# Patient Record
Sex: Female | Born: 1960
Health system: Southern US, Community
[De-identification: ages and names within clinical notes are randomized; demographics above are authoritative.]

## PROBLEM LIST (undated history)

## (undated) DIAGNOSIS — F32A Depression, unspecified: Secondary | ICD-10-CM

## (undated) DIAGNOSIS — F329 Major depressive disorder, single episode, unspecified: Secondary | ICD-10-CM

## (undated) DIAGNOSIS — E785 Hyperlipidemia, unspecified: Secondary | ICD-10-CM

## (undated) DIAGNOSIS — I10 Essential (primary) hypertension: Secondary | ICD-10-CM

## (undated) DIAGNOSIS — K219 Gastro-esophageal reflux disease without esophagitis: Secondary | ICD-10-CM

## (undated) HISTORY — PX: COLONOSCOPY: SHX174

## (undated) HISTORY — DX: Essential (primary) hypertension: I10

## (undated) HISTORY — DX: Major depressive disorder, single episode, unspecified: F32.9

## (undated) HISTORY — DX: Depression, unspecified: F32.A

## (undated) HISTORY — DX: Gastro-esophageal reflux disease without esophagitis: K21.9

## (undated) HISTORY — DX: Hyperlipidemia, unspecified: E78.5

## (undated) HISTORY — PX: TUBAL LIGATION: SHX77

---

## 1999-05-12 ENCOUNTER — Emergency Department (HOSPITAL_COMMUNITY): Admission: EM | Admit: 1999-05-12 | Discharge: 1999-05-12 | Payer: Self-pay | Admitting: Emergency Medicine

## 2001-03-17 ENCOUNTER — Other Ambulatory Visit: Admission: RE | Admit: 2001-03-17 | Discharge: 2001-03-17 | Payer: Self-pay | Admitting: Gynecology

## 2003-07-01 ENCOUNTER — Other Ambulatory Visit: Admission: RE | Admit: 2003-07-01 | Discharge: 2003-07-01 | Payer: Self-pay | Admitting: Gynecology

## 2006-09-17 LAB — HM PAP SMEAR: HM Pap smear: NORMAL

## 2006-09-17 LAB — HM COLONOSCOPY: HM Colonoscopy: NORMAL

## 2006-09-17 LAB — HM MAMMOGRAPHY: HM Mammogram: NORMAL

## 2007-02-24 ENCOUNTER — Other Ambulatory Visit: Admission: RE | Admit: 2007-02-24 | Discharge: 2007-02-24 | Payer: Self-pay | Admitting: Family Medicine

## 2007-02-27 ENCOUNTER — Encounter: Admission: RE | Admit: 2007-02-27 | Discharge: 2007-02-27 | Payer: Self-pay | Admitting: Family Medicine

## 2007-03-27 ENCOUNTER — Ambulatory Visit: Payer: Self-pay | Admitting: Gastroenterology

## 2007-04-10 ENCOUNTER — Ambulatory Visit: Payer: Self-pay | Admitting: Gastroenterology

## 2011-11-24 ENCOUNTER — Encounter (HOSPITAL_COMMUNITY): Payer: Self-pay | Admitting: Emergency Medicine

## 2011-11-24 ENCOUNTER — Other Ambulatory Visit: Payer: Self-pay

## 2011-11-24 ENCOUNTER — Emergency Department (HOSPITAL_COMMUNITY): Payer: 59

## 2011-11-24 ENCOUNTER — Emergency Department (HOSPITAL_COMMUNITY)
Admission: EM | Admit: 2011-11-24 | Discharge: 2011-11-24 | Disposition: A | Discharge status: Home / Self Care | Payer: 59 | Attending: Emergency Medicine | Admitting: Emergency Medicine

## 2011-11-24 DIAGNOSIS — R079 Chest pain, unspecified: Secondary | ICD-10-CM | POA: Insufficient documentation

## 2011-11-24 DIAGNOSIS — M940 Chondrocostal junction syndrome [Tietze]: Secondary | ICD-10-CM | POA: Insufficient documentation

## 2011-11-24 DIAGNOSIS — R7309 Other abnormal glucose: Secondary | ICD-10-CM | POA: Insufficient documentation

## 2011-11-24 DIAGNOSIS — R739 Hyperglycemia, unspecified: Secondary | ICD-10-CM

## 2011-11-24 LAB — DIFFERENTIAL
Basophils Absolute: 0 10*3/uL (ref 0.0–0.1)
Basophils Relative: 1 % (ref 0–1)
Monocytes Absolute: 0.3 10*3/uL (ref 0.1–1.0)
Neutro Abs: 2.1 10*3/uL (ref 1.7–7.7)
Neutrophils Relative %: 61 % (ref 43–77)

## 2011-11-24 LAB — D-DIMER, QUANTITATIVE: D-Dimer, Quant: 0.35 ug/mL-FEU (ref 0.00–0.48)

## 2011-11-24 LAB — CBC
HCT: 39 % (ref 36.0–46.0)
MCHC: 34.1 g/dL (ref 30.0–36.0)
RDW: 11.9 % (ref 11.5–15.5)

## 2011-11-24 LAB — COMPREHENSIVE METABOLIC PANEL
AST: 14 U/L (ref 0–37)
Albumin: 3.3 g/dL — ABNORMAL LOW (ref 3.5–5.2)
Chloride: 100 mEq/L (ref 96–112)
Creatinine, Ser: 0.64 mg/dL (ref 0.50–1.10)
Total Bilirubin: 0.5 mg/dL (ref 0.3–1.2)
Total Protein: 7 g/dL (ref 6.0–8.3)

## 2011-11-24 MED ORDER — IBUPROFEN 600 MG PO TABS: 600.0000 mg | ORAL_TABLET | Freq: Four times a day (QID) | ORAL | Status: AC | PRN

## 2011-11-24 MED ORDER — METFORMIN HCL 500 MG PO TABS: 500.0000 mg | ORAL_TABLET | Freq: Two times a day (BID) | ORAL | Status: DC

## 2011-11-24 MED ORDER — CYCLOBENZAPRINE HCL 10 MG PO TABS
10.0000 mg | ORAL_TABLET | Freq: Once | ORAL | Status: AC
  Administered 2011-11-24: 10 mg via ORAL
  Filled 2011-11-24: qty 1

## 2011-11-24 MED ORDER — KETOROLAC TROMETHAMINE 30 MG/ML IJ SOLN
30.0000 mg | Freq: Once | INTRAMUSCULAR | Status: AC
  Administered 2011-11-24: 30 mg via INTRAVENOUS
  Filled 2011-11-24: qty 1

## 2011-11-24 MED ORDER — CYCLOBENZAPRINE HCL 5 MG PO TABS: 5.0000 mg | ORAL_TABLET | Freq: Three times a day (TID) | ORAL | Status: AC | PRN

## 2011-11-24 NOTE — ED Notes (Signed)
Patient remains on monitor and 2L oxygen with sats of 100%. Patient states she is still having chest pain.

## 2011-11-24 NOTE — ED Notes (Signed)
Patient remains on monitor and 2L oxygen with sats of 100%. Patient states she is feeling better.

## 2011-11-24 NOTE — ED Notes (Signed)
Pt. Stated, i woke this am at 0530 with CP and some SOB.  My arms feel a little numb and tingling

## 2011-11-24 NOTE — ED Notes (Signed)
Patient states she woke at 0530 with chest pain and left arm was numb. Patient states she has chest pain when she takes a deep breath and right arm tingling. Patient denies N/V/F. Patient placed on monitor and 2L oxygen with satsof 99%.

## 2011-11-24 NOTE — ED Provider Notes (Signed)
History     CSN: [id number]  Arrival date & time 11/24/11  4098   First MD Initiated Contact with Patient 11/24/11 [id number]      Chief Complaint  Patient presents with  . Chest Pain    (Consider location/radiation/quality/duration/timing/severity/associated sxs/prior treatment) HPI  Patient relates she's been unemployed for 3 years. She states she recently started working at [employer]. 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 She does not do any heavy lifting or stocking. She states at 5 AM this morning she was awakened with a central chest pain that has been constant. She states the pain is a tightness in her chest and  is sore when she touches it. She states it hurts when she breathes. She states the pain does not radiate however she states her left arm is numb and  the fingers of both hands are tingling. She states no nausea or vomiting today however she did have that last week. She denies diarrhea. She does have some shortness of breath. She denies diaphoresis. She states she had chest pain similar to this about 6 months ago that lasted about 6 hours and she did not seek medical care. She denies any swelling or pain in her legs. She denies taking any kind of hormone replacement or recent travel where she is sitting for prolonged periods.  PCP none  Past Medical History  Diagnosis Date  . Diabetes mellitus     History reviewed. No pertinent past surgical history.  History reviewed. No pertinent family history. FOP had CABG when older MOP HTN diabetes  History  Substance Use Topics  . Smoking status: No  . Smokeless tobacco: Not on file  . Alcohol Use: occass  employed  OB History    Grav Para Term Preterm Abortions TAB SAB Ect Mult Living                  Review of Systems  All other systems reviewed and are negative.    Allergies  Review of patient's allergies indicates no known allergies.  Home Medications   Current Outpatient Rx  Name Route Sig Dispense Refill  .  IBUPROFEN 200 MG PO TABS Oral Take 800 mg by mouth every 8 (eight) hours as needed. For pain.    . ADULT MULTIVITAMIN W/MINERALS CH Oral Take 1 tablet by mouth daily.    . OMEGA 3 PO Oral Take 2 capsules by mouth daily.      BP 166/83  Pulse 72  Temp(Src) 98 F (36.7 C) (Oral)  Resp 20  SpO2 100%  LMP 10/02/2011  Vital signs normal except hypertension   Physical Exam  Nursing note and vitals reviewed. Constitutional: She is oriented to person, place, and time. She appears well-developed and well-nourished.  Non-toxic appearance. She does not appear ill. She appears distressed.       Tearful, clutching her chest  HENT:  Head: Normocephalic and atraumatic.  Right Ear: External ear normal.  Left Ear: External ear normal.  Nose: Nose normal. No mucosal edema or rhinorrhea.  Mouth/Throat: Oropharynx is clear and moist and mucous membranes are normal. No dental abscesses or uvula swelling.  Eyes: Conjunctivae and EOM are normal. Pupils are equal, round, and reactive to light.  Neck: Normal range of motion and full passive range of motion without pain. Neck supple.  Cardiovascular: Normal rate, regular rhythm and normal heart sounds.  Exam reveals no gallop and no friction rub.   No murmur heard. Pulmonary/Chest: Effort normal. No respiratory distress. She  has no wheezes. She has no rhonchi. She has no rales. She exhibits tenderness. She exhibits no crepitus.       Very tender to even light touch across her central chest. I am unable to assess her breath sounds because she will not breathe deeply. She states it's painful.  Abdominal: Soft. Normal appearance and bowel sounds are normal. She exhibits no distension. There is no tenderness. There is no rebound and no guarding.  Musculoskeletal: Normal range of motion. She exhibits no edema and no tenderness.       Moves all extremities well.   Neurological: She is alert and oriented to person, place, and time. She has normal strength. No  cranial nerve deficit.  Skin: Skin is warm, dry and intact. No rash noted. No erythema. No pallor.  Psychiatric: Her speech is normal and behavior is normal. Her mood appears not anxious.       Anxious    ED Course  Procedures (including critical care time)   Medications  ketorolac (TORADOL) 30 MG/ML injection 30 mg (30 mg Intravenous Given 11/24/11 1015)  cyclobenzaprine (FLEXERIL) tablet 10 mg (10 mg Oral Given 11/24/11 0950)   Pt relates her pain is better. Pt states she has been told she is borderline diabetic. She wants to look at the diabetic diet before she starts on diabetic medications.  Pt was upset, thinking she was having a heart attack or stroke, she has been reassured.   Results for orders placed during the hospital encounter of 11/24/11  CBC      Component Value Range   WBC 3.4 (*) 4.0 - 10.5 (K/uL)   RBC 4.18  3.87 - 5.11 (MIL/uL)   Hemoglobin 13.3  12.0 - 15.0 (g/dL)   HCT 16.1  09.6 - 04.5 (%)   MCV 93.3  78.0 - 100.0 (fL)   MCH 31.8  26.0 - 34.0 (pg)   MCHC 34.1  30.0 - 36.0 (g/dL)   RDW 40.9  81.1 - 91.4 (%)   Platelets 160  150 - 400 (K/uL)  DIFFERENTIAL      Component Value Range   Neutrophils Relative 61  43 - 77 (%)   Neutro Abs 2.1  1.7 - 7.7 (K/uL)   Lymphocytes Relative 28  12 - 46 (%)   Lymphs Abs 1.0  0.7 - 4.0 (K/uL)   Monocytes Relative 9  3 - 12 (%)   Monocytes Absolute 0.3  0.1 - 1.0 (K/uL)   Eosinophils Relative 1  0 - 5 (%)   Eosinophils Absolute 0.0  0.0 - 0.7 (K/uL)   Basophils Relative 1  0 - 1 (%)   Basophils Absolute 0.0  0.0 - 0.1 (K/uL)  COMPREHENSIVE METABOLIC PANEL      Component Value Range   Sodium 135  135 - 145 (mEq/L)   Potassium 3.9  3.5 - 5.1 (mEq/L)   Chloride 100  96 - 112 (mEq/L)   CO2 26  19 - 32 (mEq/L)   Glucose, Bld 283 (*) 70 - 99 (mg/dL)   BUN 12  6 - 23 (mg/dL)   Creatinine, Ser 7.82  0.50 - 1.10 (mg/dL)   Calcium 8.9  8.4 - 95.6 (mg/dL)   Total Protein 7.0  6.0 - 8.3 (g/dL)   Albumin 3.3 (*) 3.5 - 5.2 (g/dL)     AST 14  0 - 37 (U/L)   ALT 18  0 - 35 (U/L)   Alkaline Phosphatase 79  39 - 117 (U/L)   Total Bilirubin  0.5  0.3 - 1.2 (mg/dL)   GFR calc non Af Amer >90  >90 (mL/min)   GFR calc Af Amer >90  >90 (mL/min)  TROPONIN I      Component Value Range   Troponin I <0.30  <0.30 (ng/mL)  D-DIMER, QUANTITATIVE      Component Value Range   D-Dimer, Quant 0.35  0.00 - 0.48 (ug/mL-FEU)   No results found.   Date: 11/24/2011  Rate: 72  Rhythm: normal sinus rhythm  QRS Axis: normal  Intervals: normal  ST/T Wave abnormalities: normal  Conduction Disutrbances:none  Narrative Interpretation: Q waves septally  Old EKG Reviewed: unchanged from 10/162009    1. Costochondritis   2. Chest pain   3. Hyperglycemia    New Prescriptions   CYCLOBENZAPRINE (FLEXERIL) 5 MG TABLET    Take 1 tablet (5 mg total) by mouth 3 (three) times daily as needed for muscle spasms.   IBUPROFEN (ADVIL,MOTRIN) 600 MG TABLET    Take 1 tablet (600 mg total) by mouth every 6 (six) hours as needed for pain.   METFORMIN (GLUCOPHAGE) 500 MG TABLET    Take 1 tablet (500 mg total) by mouth 2 (two) times daily with a meal.   Plan discharge Devoria Albe, MD, FACEP    MDM          Ward Givens, MD 11/24/11 [phone]

## 2011-11-24 NOTE — Discharge Instructions (Signed)
Take the ibuprofen and flexeril for your chest wall pain (costochondritis). Try soaking in a warm shower to relax your chest wall muscles. Recheck if you get a cough, fever or struggle to breathe. Your blood glucose was 283 which is high (it should be 100 or less). Look at the diabetic diet and start trying to follow it. You can have your blood sugar checked again at the [facility] in 1-2 weeks and if you blood sugar is still high you should start the metformin. Contact your insurance company to find a primary care doctor.

## 2011-11-24 NOTE — ED Notes (Signed)
Patient remains on monitor and 2L oxygen sats 100%. Patient states she is having chest pain at this time.

## 2012-01-02 ENCOUNTER — Ambulatory Visit: Payer: 59 | Admitting: Family Medicine

## 2012-04-03 ENCOUNTER — Encounter: Payer: Self-pay | Admitting: Internal Medicine

## 2012-04-03 ENCOUNTER — Other Ambulatory Visit (INDEPENDENT_AMBULATORY_CARE_PROVIDER_SITE_OTHER): Payer: 59

## 2012-04-03 ENCOUNTER — Ambulatory Visit (INDEPENDENT_AMBULATORY_CARE_PROVIDER_SITE_OTHER): Payer: 59 | Admitting: Internal Medicine

## 2012-04-03 VITALS — BP 148/98 | HR 81 | Temp 97.8°F | Resp 16 | Wt 200.5 lb

## 2012-04-03 DIAGNOSIS — E118 Type 2 diabetes mellitus with unspecified complications: Secondary | ICD-10-CM | POA: Insufficient documentation

## 2012-04-03 DIAGNOSIS — IMO0001 Reserved for inherently not codable concepts without codable children: Secondary | ICD-10-CM

## 2012-04-03 DIAGNOSIS — F329 Major depressive disorder, single episode, unspecified: Secondary | ICD-10-CM

## 2012-04-03 DIAGNOSIS — Z0000 Encounter for general adult medical examination without abnormal findings: Secondary | ICD-10-CM | POA: Insufficient documentation

## 2012-04-03 DIAGNOSIS — Z23 Encounter for immunization: Secondary | ICD-10-CM

## 2012-04-03 DIAGNOSIS — I10 Essential (primary) hypertension: Secondary | ICD-10-CM

## 2012-04-03 DIAGNOSIS — F32A Depression, unspecified: Secondary | ICD-10-CM | POA: Insufficient documentation

## 2012-04-03 DIAGNOSIS — Z1231 Encounter for screening mammogram for malignant neoplasm of breast: Secondary | ICD-10-CM

## 2012-04-03 LAB — URINALYSIS, ROUTINE W REFLEX MICROSCOPIC
Hgb urine dipstick: NEGATIVE
Nitrite: NEGATIVE
Specific Gravity, Urine: 1.01 (ref 1.000–1.030)
Total Protein, Urine: NEGATIVE
Urine Glucose: 100
Urobilinogen, UA: 0.2 (ref 0.0–1.0)

## 2012-04-03 LAB — CBC WITH DIFFERENTIAL/PLATELET
Basophils Absolute: 0 10*3/uL (ref 0.0–0.1)
Eosinophils Relative: 2.2 % (ref 0.0–5.0)
HCT: 40.5 % (ref 36.0–46.0)
Lymphocytes Relative: 24.3 % (ref 12.0–46.0)
Monocytes Relative: 10.5 % (ref 3.0–12.0)
Neutrophils Relative %: 62.7 % (ref 43.0–77.0)
Platelets: 179 10*3/uL (ref 150.0–400.0)
WBC: 3.6 10*3/uL — ABNORMAL LOW (ref 4.5–10.5)

## 2012-04-03 LAB — TSH: TSH: 0.6 u[IU]/mL (ref 0.35–5.50)

## 2012-04-03 LAB — COMPREHENSIVE METABOLIC PANEL
ALT: 19 U/L (ref 0–35)
Albumin: 3.9 g/dL (ref 3.5–5.2)
CO2: 25 mEq/L (ref 19–32)
Chloride: 102 mEq/L (ref 96–112)
GFR: 130.76 mL/min (ref >60.00)
Glucose, Bld: 197 mg/dL — ABNORMAL HIGH (ref 70–99)
Potassium: 5.1 mEq/L (ref 3.5–5.1)
Sodium: 135 mEq/L (ref 135–145)
Total Protein: 7.7 g/dL (ref 6.0–8.3)

## 2012-04-03 LAB — MICROALBUMIN / CREATININE URINE RATIO: Microalb Creat Ratio: 0.2 mg/g (ref 0.0–30.0)

## 2012-04-03 LAB — HEMOGLOBIN A1C: Hgb A1c MFr Bld: 8.1 % — ABNORMAL HIGH (ref 4.6–6.5)

## 2012-04-03 LAB — LIPID PANEL: VLDL: 7.6 mg/dL (ref 0.0–40.0)

## 2012-04-03 MED ORDER — VILAZODONE HCL 10 & 20 & 40 MG PO KIT: 1.0000 | PACK | Freq: Every day | ORAL | Status: DC

## 2012-04-03 MED ORDER — SITAGLIP PHOS-METFORMIN HCL ER 100-1000 MG PO TB24: 1.0000 | ORAL_TABLET | Freq: Every day | ORAL | Status: DC

## 2012-04-03 MED ORDER — AMLODIPINE-OLMESARTAN 5-20 MG PO TABS: 1.0000 | ORAL_TABLET | Freq: Every day | ORAL | Status: DC

## 2012-04-03 NOTE — Assessment & Plan Note (Signed)
I will check her labs to look for secondary causes and have asked her to start Viibryd

## 2012-04-03 NOTE — Progress Notes (Signed)
Subjective:    Patient ID: Virginia Jimenez, female    DOB: 1960-12-14, 51 y.o.   MRN: [id number]  Diabetes She presents for her follow-up diabetic visit. She has type 2 diabetes mellitus. The initial diagnosis of diabetes was made 4 months ago. Her disease course has been worsening. There are no hypoglycemic associated symptoms. Pertinent negatives for hypoglycemia include no confusion, dizziness, headaches, nervousness/anxiousness, pallor, seizures, speech difficulty, sweats or tremors. Associated symptoms include fatigue. Pertinent negatives for diabetes include no blurred vision, no chest pain, no foot paresthesias, no foot ulcerations, no polydipsia, no polyphagia, no polyuria, no visual change, no weakness and no weight loss. There are no hypoglycemic complications. Symptoms are stable. There are no diabetic complications. When asked about current treatments, none were reported. Her weight is stable. She is following a generally healthy diet. Meal planning includes avoidance of concentrated sweets. She has not had a previous visit with a dietician. She participates in exercise intermittently. An ACE inhibitor/angiotensin II receptor blocker is not being taken. She does not see a podiatrist.Eye exam is not current.  Hypertension This is a new problem. The current episode started today. The problem is uncontrolled. Pertinent negatives include no anxiety, blurred vision, chest pain, headaches, malaise/fatigue, neck pain, orthopnea, palpitations, peripheral edema, PND, shortness of breath or sweats. Past treatments include nothing. Compliance problems include exercise and diet.       Review of Systems  Constitutional: Positive for fatigue. Negative for fever, chills, weight loss, malaise/fatigue, diaphoresis, activity change, appetite change and unexpected weight change.  HENT: Negative.  Negative for neck pain.   Eyes: Negative.  Negative for blurred vision.  Respiratory: Negative for apnea, cough,  chest tightness, shortness of breath, wheezing and stridor.   Cardiovascular: Negative for chest pain, palpitations, orthopnea, leg swelling and PND.  Gastrointestinal: Negative for nausea, vomiting, abdominal pain, diarrhea, constipation and blood in stool.  Genitourinary: Negative.  Negative for polyuria.  Musculoskeletal: Negative for myalgias, back pain, joint swelling, arthralgias and gait problem.  Skin: Negative for color change, pallor, rash and wound.  Neurological: Negative for dizziness, tremors, seizures, syncope, facial asymmetry, speech difficulty, weakness, light-headedness, numbness and headaches.  Hematological: Negative for polydipsia, polyphagia and adenopathy. Does not bruise/bleed easily.  Psychiatric/Behavioral: Positive for dysphoric mood (anhedonia, feels guilty, sad over mother's death one year ago) and decreased concentration. Negative for suicidal ideas, hallucinations, behavioral problems, confusion, disturbed wake/sleep cycle, self-injury and agitation. The patient is not nervous/anxious and is not hyperactive.        Objective:   Physical Exam  Vitals reviewed. Constitutional: She is oriented to person, place, and time. She appears well-developed and well-nourished. No distress.  HENT:  Head: Normocephalic and atraumatic.  Mouth/Throat: Oropharynx is clear and moist. No oropharyngeal exudate.  Eyes: Conjunctivae are normal. Right eye exhibits no discharge. Left eye exhibits no discharge. No scleral icterus.  Neck: Normal range of motion. Neck supple. No JVD present. No tracheal deviation present. No thyromegaly present.  Cardiovascular: Normal rate, regular rhythm, normal heart sounds and intact distal pulses.  Exam reveals no gallop and no friction rub.   No murmur heard. Pulmonary/Chest: Effort normal and breath sounds normal. No stridor. No respiratory distress. She has no wheezes. She has no rales. She exhibits no tenderness.  Abdominal: Soft. Bowel sounds  are normal. She exhibits no distension and no mass. There is no tenderness. There is no rebound and no guarding.  Musculoskeletal: Normal range of motion. She exhibits no edema and no tenderness.  Lymphadenopathy:  She has no cervical adenopathy.  Neurological: She is oriented to person, place, and time.  Skin: Skin is warm and dry. No rash noted. She is not diaphoretic. No erythema. No pallor.  Psychiatric: She has a normal mood and affect. Her behavior is normal. Judgment and thought content normal.      Lab Results  Component Value Date   WBC 3.4* 11/24/2011   HGB 13.3 11/24/2011   HCT 39.0 11/24/2011   PLT 160 11/24/2011   GLUCOSE 283* 11/24/2011   ALT 18 11/24/2011   AST 14 11/24/2011   NA 135 11/24/2011   K 3.9 11/24/2011   CL 100 11/24/2011   CREATININE 0.64 11/24/2011   BUN 12 11/24/2011   CO2 26 11/24/2011      Assessment & Plan:

## 2012-04-03 NOTE — Patient Instructions (Addendum)
Diabetes, Type 2 Diabetes is a long-lasting (chronic) disease. In type 2 diabetes, the pancreas does not make enough insulin (a hormone), and the body does not respond normally to the insulin that is made. This type of diabetes was also previously called adult-onset diabetes. It usually occurs after the age of 40, but it can occur at any age.  CAUSES  Type 2 diabetes happens because the pancreasis not making enough insulin or your body has trouble using the insulin that your pancreas does make properly. SYMPTOMS   Drinking more than usual.   Urinating more than usual.   Blurred vision.   Dry, itchy skin.   Frequent infections.   Feeling more tired than usual (fatigue).  DIAGNOSIS The diagnosis of type 2 diabetes is usually made by one of the following tests:  Fasting blood glucose test. You will not eat for at least 8 hours and then take a blood test.   Random blood glucose test. Your blood glucose (sugar) is checked at any time of the day regardless of when you ate.   Oral glucose tolerance test (OGTT). Your blood glucose is measured after you have not eaten (fasted) and then after you drink a glucose containing beverage.  TREATMENT   Healthy eating.   Exercise.   Medicine, if needed.   Monitoring blood glucose.   Seeing your caregiver regularly.  HOME CARE INSTRUCTIONS   Check your blood glucose at least once a day. More frequent monitoring may be necessary, depending on your medicines and on how well your diabetes is controlled. Your caregiver will advise you.   Take your medicine as directed by your caregiver.   Do not smoke.   Make wise food choices. Ask your caregiver for information. Weight loss can improve your diabetes.   Learn about low blood glucose (hypoglycemia) and how to treat it.   Get your eyes checked regularly.   Have a yearly physical exam. Have your blood pressure checked and your blood and urine tested.   Wear a pendant or bracelet saying  that you have diabetes.   Check your feet every night for cuts, sores, blisters, and redness. Let your caregiver know if you have any problems.  SEEK MEDICAL CARE IF:   You have problems keeping your blood glucose in target range.   You have problems with your medicines.   You have symptoms of an illness that do not improve after 24 hours.   You have a sore or wound that is not healing.   You notice a change in vision or a new problem with your vision.   You have a fever.  MAKE SURE YOU:  Understand these instructions.   Will watch your condition.   Will get help right away if you are not doing well or get worse.  Document Released: 09/03/2005 Document Revised: 08/23/2011 Document Reviewed: 02/19/2011 [employer] Patient Information 2012 [employer].Hypertension As your heart beats, it forces blood through your arteries. This force is your blood pressure. If the pressure is too high, it is called hypertension (HTN) or high blood pressure. HTN is dangerous because you may have it and not know it. High blood pressure may mean that your heart has to work harder to pump blood. Your arteries may be narrow or stiff. The extra work puts you at risk for heart disease, stroke, and other problems.  Blood pressure consists of two numbers, a higher number over a lower, 110/72, for example. It is stated as "110 over 72." The ideal   is below 120 for the top number (systolic) and under 80 for the bottom (diastolic). Write down your blood pressure today. You should pay close attention to your blood pressure if you have certain conditions such as:  Heart failure.   Prior heart attack.   Diabetes   Chronic kidney disease.   Prior stroke.   Multiple risk factors for heart disease.  To see if you have HTN, your blood pressure should be measured while you are seated with your arm held at the level of the heart. It should be measured at least twice. A one-time elevated blood pressure reading  (especially in the Emergency Department) does not mean that you need treatment. There may be conditions in which the blood pressure is different between your right and left arms. It is important to see your caregiver soon for a recheck. Most people have essential hypertension which means that there is not a specific cause. This type of high blood pressure may be lowered by changing lifestyle factors such as:  Stress.   Smoking.   Lack of exercise.   Excessive weight.   Drug/tobacco/alcohol use.   Eating less salt.  Most people do not have symptoms from high blood pressure until it has caused damage to the body. Effective treatment can often prevent, delay or reduce that damage. TREATMENT  When a cause has been identified, treatment for high blood pressure is directed at the cause. There are a large number of medications to treat HTN. These fall into several categories, and your caregiver will help you select the medicines that are best for you. Medications may have side effects. You should review side effects with your caregiver. If your blood pressure stays high after you have made lifestyle changes or started on medicines,   Your medication(s) may need to be changed.   Other problems may need to be addressed.   Be certain you understand your prescriptions, and know how and when to take your medicine.   Be sure to follow up with your caregiver within the time frame advised (usually within two weeks) to have your blood pressure rechecked and to review your medications.   If you are taking more than one medicine to lower your blood pressure, make sure you know how and at what times they should be taken. Taking two medicines at the same time can result in blood pressure that is too low.  SEEK IMMEDIATE MEDICAL CARE IF:  You develop a severe headache, blurred or changing vision, or confusion.   You have unusual weakness or numbness, or a faint feeling.   You have severe chest or  abdominal pain, vomiting, or breathing problems.  MAKE SURE YOU:   Understand these instructions.   Will watch your condition.   Will get help right away if you are not doing well or get worse.  Document Released: 09/03/2005 Document Revised: 08/23/2011 Document Reviewed: 04/23/2008 [facility] Patient Information 2012 [place], [place].Depression, Adolescent and Adult Depression is a true and treatable medical condition. In general there are two kinds of depression:  Depression we all experience in some form. For example depression from the death of a loved one, financial distress or natural disasters will trigger or increase depression.   Clinical depression, on the other hand, appears without an apparent cause or reason. This depression is a disease. Depression may be caused by chemical imbalance in the body and brain or may come as a response to a physical illness. Alcohol and other drugs can cause depression.  DIAGNOSIS  The diagnosis of depression is usually based upon symptoms and medical history. TREATMENT  Treatments for depression fall into three categories. These are:  Drug therapy. There are many medicines that treat depression. Responses may vary and sometimes trial and error is necessary to determine the best medicines and dosage for a particular patient.   Psychotherapy, also called talking treatments, helps people resolve their problems by looking at them from a different point of view and by giving people insight into their own personal makeup. Traditional psychotherapy looks at a childhood source of a problem. Other psychotherapy will look at current conflicts and move toward solving those. If the cause of depression is drug use, counseling is available to help abstain. In time the depression will usually improve. If there were underlying causes for the chemical use, they can be addressed.   ECT (electroconvulsive therapy) or shock treatment is not as commonly used today. It is a  very effective treatment for severe suicidal depression. During ECT electrical impulses are applied to the head. These impulses cause a generalized seizure. It can be effective but causes a loss of memory for recent events. Sometimes this loss of memory may include the last several months.  Treat all depression or suicide threats as serious. Obtain professional help. Do not wait to see if serious depression will get better over time without help. Seek help for yourself or those around you. In the [place] the number to the National Suicide Help Lines With 24 Hour Help Are: 1-800-SUICIDE (727)362-5983 Document Released: 08/31/2000 Document Revised: 08/23/2011 Document Reviewed: 04/21/2008 [facility] Patient Information 2012 [place], [place].

## 2012-04-03 NOTE — Assessment & Plan Note (Signed)
I will check her A1C and will monitor her renal function, I have asked her to start Janumet-XR, she needs diabetic education and an eye exam

## 2012-04-03 NOTE — Assessment & Plan Note (Signed)
I will check her labs to look for end organ damage and for secondary causes, also will start Azor

## 2012-04-04 ENCOUNTER — Encounter: Payer: Self-pay | Admitting: Internal Medicine

## 2012-04-04 LAB — LDL CHOLESTEROL, DIRECT: Direct LDL: 123.9 mg/dL

## 2012-04-17 ENCOUNTER — Ambulatory Visit: Payer: 59 | Admitting: Endocrinology

## 2012-04-30 ENCOUNTER — Other Ambulatory Visit (HOSPITAL_COMMUNITY)
Admission: RE | Admit: 2012-04-30 | Discharge: 2012-04-30 | Disposition: A | Discharge status: Home / Self Care | Payer: 59 | Source: Ambulatory Visit | Attending: Women's Health | Admitting: Women's Health

## 2012-04-30 ENCOUNTER — Ambulatory Visit (INDEPENDENT_AMBULATORY_CARE_PROVIDER_SITE_OTHER): Payer: 59 | Admitting: Women's Health

## 2012-04-30 ENCOUNTER — Encounter: Payer: Self-pay | Admitting: Women's Health

## 2012-04-30 VITALS — BP 126/84 | Ht 65.0 in | Wt 200.0 lb

## 2012-04-30 DIAGNOSIS — N949 Unspecified condition associated with female genital organs and menstrual cycle: Secondary | ICD-10-CM

## 2012-04-30 DIAGNOSIS — B9689 Other specified bacterial agents as the cause of diseases classified elsewhere: Secondary | ICD-10-CM

## 2012-04-30 DIAGNOSIS — Z01419 Encounter for gynecological examination (general) (routine) without abnormal findings: Secondary | ICD-10-CM

## 2012-04-30 DIAGNOSIS — N938 Other specified abnormal uterine and vaginal bleeding: Secondary | ICD-10-CM

## 2012-04-30 DIAGNOSIS — N760 Acute vaginitis: Secondary | ICD-10-CM

## 2012-04-30 DIAGNOSIS — Z1151 Encounter for screening for human papillomavirus (HPV): Secondary | ICD-10-CM | POA: Insufficient documentation

## 2012-04-30 DIAGNOSIS — A499 Bacterial infection, unspecified: Secondary | ICD-10-CM

## 2012-04-30 DIAGNOSIS — B373 Candidiasis of vulva and vagina: Secondary | ICD-10-CM

## 2012-04-30 LAB — WET PREP FOR TRICH, YEAST, CLUE

## 2012-04-30 LAB — HM PAP SMEAR

## 2012-04-30 MED ORDER — FLUCONAZOLE 150 MG PO TABS: 150.0000 mg | ORAL_TABLET | Freq: Once | ORAL | Status: AC

## 2012-04-30 MED ORDER — METRONIDAZOLE 0.75 % VA GEL: VAGINAL | Status: AC

## 2012-04-30 NOTE — Progress Notes (Signed)
Virginia Jimenez [birth year] [id number]    History:    The patient presents for annual exam. New patient with a problem. History of BTL,  not sexually active for greater than 6 months. Had regular monthly 6-7 day cycles until December then no cycle through March 2013. Last 4 months 2 periods a month approximately 15 days between cycles with heavy flow. Currently being treated for hypertension, diabetes, H. pylori, and depression per primary care. History of normal mammograms and Paps. Had a normal colonoscopy at age 31. Had a normal TSH July 2013.   Past medical history, past surgical history, family history and social history were all reviewed and documented in the EPIC chart. Works at [employer]. Has a daughter 46, son 68 both well. Minimal healthcare for several years was laid off and was without insurance.   ROS:  A  ROS was performed and pertinent positives and negatives are included in the history.  Exam:  Filed Vitals:   04/30/12 1430  BP: 126/84    General appearance:  Normal Head/Neck:  Normal, without cervical or supraclavicular adenopathy. Thyroid:  Symmetrical, normal in size, without palpable masses or nodularity. Respiratory  Effort:  Normal  Auscultation:  Clear without wheezing or rhonchi Cardiovascular  Auscultation:  Regular rate, without rubs, murmurs or gallops  Edema/varicosities:  Not grossly evident Abdominal  Soft,nontender, without masses, guarding or rebound.  Liver/spleen:  No organomegaly noted  Hernia:  None appreciated  Skin  Inspection:  Grossly normal  Palpation:  Grossly normal Neurologic/psychiatric  Orientation:  Normal with appropriate conversation.  Mood/affect:  Normal  Genitourinary    Breasts: Examined lying and sitting.     Right: Without masses, retractions, discharge or axillary adenopathy.     Left: Without masses, retractions, discharge or axillary adenopathy.   Inguinal/mons:  Normal without inguinal adenopathy  External  genitalia:  Normal  BUS/Urethra/Skene's glands:  Normal  Bladder:  Normal  Vagina:  Normal scant discharge, no odor wet prep positive for clue cells and TNTC bacteria  Cervix:  Normal  Uterus:  Retro-verted, 10 weeks' size/fibroids .  Midline and mobile  Adnexa/parametria:     Rt: Without masses or tenderness.   Lt: Without masses or tenderness.  Anus and perineum: Normal  Digital rectal exam: Normal sphincter tone without palpated masses or tenderness  Assessment/Plan:  51 y.o. DBF G2 P2 for annual exam with problem of cycles every 2 weeks for 4 months and vaginal itchy.  DUB/probable fibroids Hypertension/diabetes/depression-primary care labs and meds Obesity BV  Plan: Schedule sonohysterogram with Dr. Lily Peer this week or schedule after next cycle. Continue to keep menstrual record. SBE's, annual mammogram, instructed to schedule, increase exercise, decrease calories for weight loss encouraged.  Prolactin,(Normal TSH 03/2012) GC/Chlamydia, UA, Pap. MetroGel vaginal cream 1 applicator at bedtime x5 prescription, proper use given and reviewed. Also gave her a prescription for Diflucan 150 times one dose for vaginal itching.   Harrington Challenger [facility] [place], 3:43 PM 04/30/2012

## 2012-04-30 NOTE — Patient Instructions (Signed)

## 2012-05-01 LAB — PROLACTIN: Prolactin: 4.8 ng/mL

## 2012-05-01 LAB — GC/CHLAMYDIA PROBE AMP, GENITAL: GC Probe Amp, Genital: NEGATIVE

## 2012-05-02 ENCOUNTER — Other Ambulatory Visit: Payer: Self-pay | Admitting: Women's Health

## 2012-05-02 DIAGNOSIS — B999 Unspecified infectious disease: Secondary | ICD-10-CM

## 2012-05-02 MED ORDER — AZITHROMYCIN 500 MG PO TABS: 1000.0000 mg | ORAL_TABLET | Freq: Every day | ORAL | Status: AC

## 2012-05-05 ENCOUNTER — Other Ambulatory Visit: Payer: 59

## 2012-05-05 ENCOUNTER — Ambulatory Visit: Payer: 59 | Admitting: Endocrinology

## 2012-05-05 ENCOUNTER — Ambulatory Visit: Payer: 59 | Admitting: Gynecology

## 2012-05-09 ENCOUNTER — Ambulatory Visit: Payer: 59 | Admitting: Internal Medicine

## 2012-05-14 ENCOUNTER — Ambulatory Visit: Payer: 59 | Admitting: Internal Medicine

## 2012-05-28 ENCOUNTER — Ambulatory Visit: Payer: 59 | Admitting: Women's Health

## 2012-05-30 ENCOUNTER — Telehealth: Payer: Self-pay | Admitting: *Deleted

## 2012-05-30 NOTE — Telephone Encounter (Signed)
Brandy from health department called requesting medication and dose for positive Chlamydia, Zithromax 1 g x1 dose given on 05/02/12.

## 2012-06-04 ENCOUNTER — Ambulatory Visit: Payer: 59 | Admitting: Gynecology

## 2012-06-04 ENCOUNTER — Other Ambulatory Visit: Payer: 59

## 2012-06-11 ENCOUNTER — Ambulatory Visit: Payer: 59 | Admitting: Internal Medicine

## 2012-06-11 ENCOUNTER — Ambulatory Visit: Payer: 59 | Admitting: Endocrinology

## 2012-06-11 DIAGNOSIS — Z0289 Encounter for other administrative examinations: Secondary | ICD-10-CM

## 2012-07-02 ENCOUNTER — Encounter: Payer: Self-pay | Admitting: Internal Medicine

## 2012-07-02 ENCOUNTER — Ambulatory Visit (INDEPENDENT_AMBULATORY_CARE_PROVIDER_SITE_OTHER): Payer: 59 | Admitting: Internal Medicine

## 2012-07-02 ENCOUNTER — Other Ambulatory Visit (INDEPENDENT_AMBULATORY_CARE_PROVIDER_SITE_OTHER): Payer: 59

## 2012-07-02 VITALS — BP 124/78 | HR 84 | Temp 96.5°F | Resp 16 | Ht 65.0 in | Wt 199.1 lb

## 2012-07-02 DIAGNOSIS — I10 Essential (primary) hypertension: Secondary | ICD-10-CM

## 2012-07-02 DIAGNOSIS — IMO0001 Reserved for inherently not codable concepts without codable children: Secondary | ICD-10-CM

## 2012-07-02 DIAGNOSIS — Z23 Encounter for immunization: Secondary | ICD-10-CM

## 2012-07-02 LAB — COMPREHENSIVE METABOLIC PANEL
ALT: 19 U/L (ref 0–35)
Albumin: 3.7 g/dL (ref 3.5–5.2)
CO2: 30 mEq/L (ref 19–32)
Calcium: 9.1 mg/dL (ref 8.4–10.5)
Chloride: 103 mEq/L (ref 96–112)
GFR: 115.46 mL/min (ref >60.00)
Glucose, Bld: 245 mg/dL — ABNORMAL HIGH (ref 70–99)
Potassium: 4.3 mEq/L (ref 3.5–5.1)
Sodium: 137 mEq/L (ref 135–145)
Total Bilirubin: 0.5 mg/dL (ref 0.3–1.2)
Total Protein: 7.9 g/dL (ref 6.0–8.3)

## 2012-07-02 LAB — HEMOGLOBIN A1C: Hgb A1c MFr Bld: 8.1 % — ABNORMAL HIGH (ref 4.6–6.5)

## 2012-07-02 MED ORDER — SITAGLIP PHOS-METFORMIN HCL ER 100-1000 MG PO TB24: 1.0000 | ORAL_TABLET | Freq: Every day | ORAL | Status: DC

## 2012-07-02 MED ORDER — AMLODIPINE-OLMESARTAN 5-20 MG PO TABS: 1.0000 | ORAL_TABLET | Freq: Every day | ORAL | Status: DC

## 2012-07-02 NOTE — Progress Notes (Signed)
Subjective:    Patient ID: Virginia Jimenez, female    DOB: Mar 16, 1961, 51 y.o.   MRN: [id number]  Diabetes She presents for her follow-up diabetic visit. She has type 2 diabetes mellitus. Her disease course has been stable. There are no hypoglycemic associated symptoms. Pertinent negatives for hypoglycemia include no dizziness. Pertinent negatives for diabetes include no blurred vision, no chest pain, no fatigue, no foot paresthesias, no foot ulcerations, no polydipsia, no polyphagia, no polyuria, no visual change, no weakness and no weight loss. There are no hypoglycemic complications. Symptoms are stable. There are no diabetic complications. Current diabetic treatment includes oral agent (dual therapy). She is compliant with treatment some of the time. Her weight is stable. She is following a generally healthy diet. Meal planning includes avoidance of concentrated sweets. She has not had a previous visit with a dietician. She participates in exercise intermittently. Her home blood glucose trend is increasing steadily. Her breakfast blood glucose range is generally 140-180 mg/dl. Her lunch blood glucose range is generally 180-200 mg/dl. Her dinner blood glucose range is generally >200 mg/dl. Her highest blood glucose is >200 mg/dl. Her overall blood glucose range is 180-200 mg/dl. An ACE inhibitor/angiotensin II receptor blocker is being taken. She does not see a podiatrist.Eye exam is current.      Review of Systems  Constitutional: Negative for fever, chills, weight loss, diaphoresis, activity change, appetite change, fatigue and unexpected weight change.  HENT: Negative.   Eyes: Negative.  Negative for blurred vision.  Respiratory: Negative for cough, chest tightness, shortness of breath, wheezing and stridor.   Cardiovascular: Negative for chest pain, palpitations and leg swelling.  Gastrointestinal: Negative for nausea, vomiting, abdominal pain, diarrhea, constipation and blood in stool.    Genitourinary: Negative.  Negative for polyuria.  Musculoskeletal: Negative.   Skin: Negative.   Neurological: Negative.  Negative for dizziness, weakness and light-headedness.  Hematological: Negative for polydipsia, polyphagia and adenopathy. Does not bruise/bleed easily.       Objective:   Physical Exam  Vitals reviewed. Constitutional: She is oriented to person, place, and time. She appears well-developed and well-nourished. No distress.  HENT:  Head: Normocephalic and atraumatic.  Mouth/Throat: Oropharynx is clear and moist. No oropharyngeal exudate.  Eyes: Conjunctivae normal are normal. Right eye exhibits no discharge. Left eye exhibits no discharge. No scleral icterus.  Neck: Normal range of motion. Neck supple. No JVD present. No tracheal deviation present. No thyromegaly present.  Cardiovascular: Normal rate, regular rhythm, normal heart sounds and intact distal pulses.  Exam reveals no gallop and no friction rub.   No murmur heard. Pulmonary/Chest: Effort normal and breath sounds normal. No stridor. No respiratory distress. She has no wheezes. She has no rales. She exhibits no tenderness.  Abdominal: Soft. Bowel sounds are normal. She exhibits no distension and no mass. There is no tenderness. There is no rebound and no guarding.  Musculoskeletal: Normal range of motion. She exhibits no edema and no tenderness.  Lymphadenopathy:    She has no cervical adenopathy.  Neurological: She is oriented to person, place, and time.  Skin: Skin is warm and dry. No rash noted. She is not diaphoretic. No erythema. No pallor.  Psychiatric: She has a normal mood and affect. Her behavior is normal. Judgment and thought content normal.      Lab Results  Component Value Date   WBC 3.6* 04/03/2012   HGB 13.5 04/03/2012   HCT 40.5 04/03/2012   PLT 179.0 04/03/2012   GLUCOSE 197* 04/03/2012  CHOL 218* 04/03/2012   TRIG 38.0 04/03/2012   HDL 79.20 04/03/2012   LDLDIRECT 123.9 04/03/2012   ALT  19 04/03/2012   AST 30 04/03/2012   NA 135 04/03/2012   K 5.1 04/03/2012   CL 102 04/03/2012   CREATININE 0.6 04/03/2012   BUN 15 04/03/2012   CO2 25 04/03/2012   TSH 0.60 04/03/2012   HGBA1C 8.1* 04/03/2012   MICROALBUR 0.2 04/03/2012      Assessment & Plan:

## 2012-07-02 NOTE — Patient Instructions (Signed)

## 2012-07-03 ENCOUNTER — Encounter: Payer: Self-pay | Admitting: Internal Medicine

## 2012-07-03 NOTE — Assessment & Plan Note (Signed)
She will restart Janumet, I will check her a1c and her renal function today

## 2012-07-03 NOTE — Assessment & Plan Note (Signed)
Her BP is well controlled, I will check her lytes and renal function today 

## 2012-07-30 ENCOUNTER — Telehealth: Payer: Self-pay | Admitting: Internal Medicine

## 2012-07-30 DIAGNOSIS — IMO0001 Reserved for inherently not codable concepts without codable children: Secondary | ICD-10-CM

## 2012-07-30 MED ORDER — SITAGLIP PHOS-METFORMIN HCL ER 100-1000 MG PO TB24: 1.0000 | ORAL_TABLET | Freq: Every day | ORAL | Status: DC

## 2012-07-30 NOTE — Telephone Encounter (Signed)
Patient has been using Janumet samples and now she would like it called in to her pharmacy, [facility] on [address]

## 2012-09-24 ENCOUNTER — Other Ambulatory Visit: Payer: Self-pay

## 2012-09-24 DIAGNOSIS — I10 Essential (primary) hypertension: Secondary | ICD-10-CM

## 2012-09-24 DIAGNOSIS — IMO0001 Reserved for inherently not codable concepts without codable children: Secondary | ICD-10-CM

## 2012-09-24 MED ORDER — AMLODIPINE-OLMESARTAN 5-20 MG PO TABS: 1.0000 | ORAL_TABLET | Freq: Every day | ORAL | Status: DC

## 2013-03-04 ENCOUNTER — Encounter: Payer: Self-pay | Admitting: Internal Medicine

## 2013-03-04 ENCOUNTER — Ambulatory Visit (INDEPENDENT_AMBULATORY_CARE_PROVIDER_SITE_OTHER): Payer: 59 | Admitting: Internal Medicine

## 2013-03-04 VITALS — BP 132/88 | HR 85 | Temp 97.5°F | Ht 65.0 in | Wt 197.0 lb

## 2013-03-04 DIAGNOSIS — I10 Essential (primary) hypertension: Secondary | ICD-10-CM

## 2013-03-04 DIAGNOSIS — K219 Gastro-esophageal reflux disease without esophagitis: Secondary | ICD-10-CM | POA: Insufficient documentation

## 2013-03-04 DIAGNOSIS — K3189 Other diseases of stomach and duodenum: Secondary | ICD-10-CM

## 2013-03-04 DIAGNOSIS — R1013 Epigastric pain: Secondary | ICD-10-CM

## 2013-03-04 DIAGNOSIS — IMO0001 Reserved for inherently not codable concepts without codable children: Secondary | ICD-10-CM

## 2013-03-04 DIAGNOSIS — K921 Melena: Secondary | ICD-10-CM

## 2013-03-04 MED ORDER — METFORMIN HCL 500 MG PO TABS: 500.0000 mg | ORAL_TABLET | Freq: Two times a day (BID) | ORAL | Status: DC

## 2013-03-04 MED ORDER — OMEPRAZOLE MAGNESIUM 20 MG PO TBEC: 20.0000 mg | DELAYED_RELEASE_TABLET | Freq: Every day | ORAL | Status: DC

## 2013-03-04 MED ORDER — IRBESARTAN 150 MG PO TABS: 150.0000 mg | ORAL_TABLET | Freq: Every day | ORAL | Status: DC

## 2013-03-04 MED ORDER — AMLODIPINE BESYLATE 5 MG PO TABS: 5.0000 mg | ORAL_TABLET | Freq: Every day | ORAL | Status: DC

## 2013-03-04 NOTE — Assessment & Plan Note (Signed)
Vs gastroparesis - for PPI trial, consider GI referral

## 2013-03-04 NOTE — Progress Notes (Signed)
Subjective:    Patient ID: Virginia Jimenez, female    DOB: 02-26-1961, 52 y.o.   MRN: [id number]  HPI  Here to f/u as Dr Yetta Barre not available; overall doing ok,  Pt denies chest pain, increased sob or doe, wheezing, orthopnea, PND, increased LE swelling, palpitations, dizziness or syncope.  Pt denies polydipsia, polyuria, or low sugar symptoms such as weakness or confusion improved with po intake.  Pt denies new neurological symptoms such as new headache, or facial or extremity weakness or numbness.   Pt  has been trying to follow lower cholesterol, diabetic diet, with wt overall stable,  but little exercise however Not taken any BP or DM meds sicne jan due to cost - overall total $80 or more monthly, has fixed income.CBG was 150.  Mother died recently with lung cancer, And father had asbstesis/mesothelioma, grandmother with colon cancer, aunt with breast cancer. Wants a blood test to make sure she has no cancer.  C/o also nausea intermittent mild for 2 wks with burnign discomfort too, TUMS little to no help.   Past Medical History  Diagnosis Date  . Diabetes mellitus   . Hypertension   . Depression    Past Surgical History  Procedure Laterality Date  . Tubal ligation      reports that she has never smoked. She has never used smokeless tobacco. She reports that she does not drink alcohol or use illicit drugs. family history includes Arthritis in her father; Cancer in her mother and paternal grandmother; Diabetes in her father; Heart disease in her father; Hypertension in her father and mother; and Lung cancer in her mother.  There is no history of Early death, and Kidney disease, and Stroke, . No Known Allergies Current Outpatient Prescriptions on File Prior to Visit  Medication Sig Dispense Refill  . ibuprofen (ADVIL,MOTRIN) 200 MG tablet Take 800 mg by mouth every 8 (eight) hours as needed. For pain.      . Multiple Vitamin (MULITIVITAMIN WITH MINERALS) TABS Take 1 tablet by mouth daily.      .  Omega-3 Fatty Acids (OMEGA 3 PO) Take 2 capsules by mouth daily.      . Vilazodone HCl (VIIBRYD) 10 & 20 & 40 MG KIT Take 1 kit by mouth daily.  1 kit  0   No current facility-administered medications on file prior to visit.   Review of Systems  Constitutional: Negative for unexpected weight change, or unusual diaphoresis  HENT: Negative for tinnitus.   Eyes: Negative for photophobia and visual disturbance.  Respiratory: Negative for choking and stridor.   Gastrointestinal: Negative for vomiting but has ? Of small spot of blood in stool x 1 last wk, due for colonoscopy  Genitourinary: Negative for hematuria and decreased urine volume.  Musculoskeletal: Negative for acute joint swelling Skin: Negative for color change and wound.  Neurological: Negative for tremors and numbness other than noted  Psychiatric/Behavioral: Negative for decreased concentration or  hyperactivity.       Objective:   Physical Exam BP 132/88  Pulse 85  Temp(Src) 97.5 F (36.4 C) (Oral)  Ht 5\' 5"  (1.651 m)  Wt 197 lb (89.359 kg)  BMI 32.78 kg/m2  SpO2 95% VS noted,  Constitutional: Pt appears well-developed and well-nourished.  HENT: Head: NCAT.  Right Ear: External ear normal.  Left Ear: External ear normal.  Eyes: Conjunctivae and EOM are normal. Pupils are equal, round, and reactive to light.  Neck: Normal range of motion. Neck supple.  Cardiovascular: Normal  rate and regular rhythm.   Pulmonary/Chest: Effort normal and breath sounds normal.  Abd:  Soft, NT, non-distended, + BS except for mild epigastric tender, no guarding or rebound Neurological: Pt is alert. Not confused  Skin: Skin is warm. No erythema.  Psychiatric: Pt behavior is normal. Thought content normal.     Assessment & Plan:

## 2013-03-04 NOTE — Assessment & Plan Note (Signed)
To adjust med to generic affordables asd,  to f/u any worsening symptoms or concerns

## 2013-03-04 NOTE — Assessment & Plan Note (Signed)
To change to more affordable med - simply metformin 500 bid for now, as she has a [employer] across the street from her place of employment; to cont to [employer], f/u a1c at 6 wks, consider add less expensive glipizide if a1c still > 7

## 2013-03-04 NOTE — Assessment & Plan Note (Signed)
Very small volume x 1 last wk, o/w asympt, due for colonscopy for screening - ok to refer

## 2013-03-04 NOTE — Patient Instructions (Signed)
OK to no longer take the azor or janumet due to cost Please take all new medication as prescribed- the metformin, amlodipine, and avapro as these should be less expensive at [facility] Please continue all other medications as before Please also take the prilosec 20 mg samples for the nauseas/discomfort to see if this helps, this is also available in prescription if you think this helps You will be contacted regarding the referral for: colonoscopy  Please return in 6 weeks to Dr Yetta Barre, or sooner if needed, with Lab testing done 3-5 days before

## 2013-04-15 ENCOUNTER — Other Ambulatory Visit (INDEPENDENT_AMBULATORY_CARE_PROVIDER_SITE_OTHER): Payer: 59

## 2013-04-15 ENCOUNTER — Ambulatory Visit (INDEPENDENT_AMBULATORY_CARE_PROVIDER_SITE_OTHER): Payer: 59 | Admitting: Internal Medicine

## 2013-04-15 ENCOUNTER — Encounter: Payer: Self-pay | Admitting: Internal Medicine

## 2013-04-15 VITALS — BP 116/80 | HR 76 | Temp 97.6°F | Resp 16 | Ht 65.0 in | Wt 197.0 lb

## 2013-04-15 DIAGNOSIS — E785 Hyperlipidemia, unspecified: Secondary | ICD-10-CM | POA: Insufficient documentation

## 2013-04-15 DIAGNOSIS — E669 Obesity, unspecified: Secondary | ICD-10-CM

## 2013-04-15 DIAGNOSIS — I10 Essential (primary) hypertension: Secondary | ICD-10-CM

## 2013-04-15 DIAGNOSIS — IMO0001 Reserved for inherently not codable concepts without codable children: Secondary | ICD-10-CM

## 2013-04-15 DIAGNOSIS — K219 Gastro-esophageal reflux disease without esophagitis: Secondary | ICD-10-CM

## 2013-04-15 LAB — LIPID PANEL
Cholesterol: 229 mg/dL — ABNORMAL HIGH (ref 0–200)
HDL: 77.4 mg/dL (ref >39.00)
Total CHOL/HDL Ratio: 3
Triglycerides: 35 mg/dL (ref 0.0–149.0)
VLDL: 7 mg/dL (ref 0.0–40.0)

## 2013-04-15 LAB — URINALYSIS, ROUTINE W REFLEX MICROSCOPIC
Hgb urine dipstick: NEGATIVE
Leukocytes, UA: NEGATIVE
Nitrite: NEGATIVE
RBC / HPF: NONE SEEN (ref 0–?)
Specific Gravity, Urine: <=1.005 (ref 1.000–1.030)
Urine Glucose: 500
Urobilinogen, UA: 0.2 (ref 0.0–1.0)
WBC, UA: NONE SEEN (ref 0–?)

## 2013-04-15 LAB — CBC WITH DIFFERENTIAL/PLATELET
Eosinophils Relative: 1.8 % (ref 0.0–5.0)
HCT: 39 % (ref 36.0–46.0)
Hemoglobin: 13.4 g/dL (ref 12.0–15.0)
Lymphs Abs: 0.8 10*3/uL (ref 0.7–4.0)
MCV: 93.6 fl (ref 78.0–100.0)
Monocytes Absolute: 0.3 10*3/uL (ref 0.1–1.0)
Monocytes Relative: 9.3 % (ref 3.0–12.0)
Neutro Abs: 2.2 10*3/uL (ref 1.4–7.7)
RDW: 12.5 % (ref 11.5–14.6)

## 2013-04-15 LAB — COMPREHENSIVE METABOLIC PANEL
ALT: 20 U/L (ref 0–35)
Alkaline Phosphatase: 69 U/L (ref 39–117)
CO2: 29 mEq/L (ref 19–32)
Creatinine, Ser: 0.7 mg/dL (ref 0.4–1.2)
GFR: 123.31 mL/min (ref >60.00)
Sodium: 138 mEq/L (ref 135–145)
Total Bilirubin: 0.7 mg/dL (ref 0.3–1.2)
Total Protein: 7.6 g/dL (ref 6.0–8.3)

## 2013-04-15 LAB — MICROALBUMIN / CREATININE URINE RATIO: Creatinine,U: 41.5 mg/dL

## 2013-04-15 LAB — TSH: TSH: 0.9 u[IU]/mL (ref 0.35–5.50)

## 2013-04-15 MED ORDER — EZETIMIBE-SIMVASTATIN 10-20 MG PO TABS: 1.0000 | ORAL_TABLET | Freq: Every day | ORAL | Status: DC

## 2013-04-15 MED ORDER — ALOGLIPTIN-METFORMIN HCL 12.5-500 MG PO TABS: 1.0000 | ORAL_TABLET | Freq: Two times a day (BID) | ORAL | Status: DC

## 2013-04-15 MED ORDER — PHENTERMINE HCL 37.5 MG PO CAPS: 37.5000 mg | ORAL_CAPSULE | ORAL | Status: DC

## 2013-04-15 MED ORDER — DEXLANSOPRAZOLE 60 MG PO CPDR: 60.0000 mg | DELAYED_RELEASE_CAPSULE | Freq: Every day | ORAL | Status: DC

## 2013-04-15 NOTE — Progress Notes (Signed)
Subjective:    Patient ID: Virginia Jimenez, female    DOB: 1961/03/29, 52 y.o.   MRN: [id number]  Diabetes She presents for her follow-up diabetic visit. She has type 2 diabetes mellitus. Her disease course has been stable. There are no hypoglycemic associated symptoms. Associated symptoms include polydipsia, polyphagia and polyuria. Pertinent negatives for diabetes include no blurred vision, no chest pain, no fatigue, no foot paresthesias, no foot ulcerations, no visual change, no weakness and no weight loss. There are no hypoglycemic complications. Symptoms are stable. There are no diabetic complications. Current diabetic treatment includes oral agent (monotherapy). She is compliant with treatment most of the time. Her weight is stable. She is following a generally healthy diet. Meal planning includes avoidance of concentrated sweets. She participates in exercise intermittently. Her breakfast blood glucose range is generally 130-140 mg/dl. Her lunch blood glucose range is generally 140-180 mg/dl. Her dinner blood glucose range is generally 140-180 mg/dl. Her highest blood glucose is >200 mg/dl. Her overall blood glucose range is 140-180 mg/dl. An ACE inhibitor/angiotensin II receptor blocker is being taken. She does not see a podiatrist.Eye exam is current.      Review of Systems  Constitutional: Negative.  Negative for fever, chills, weight loss, diaphoresis, appetite change and fatigue.  HENT: Negative.   Eyes: Negative.  Negative for blurred vision.  Respiratory: Negative.  Negative for cough, chest tightness, shortness of breath, wheezing and stridor.   Cardiovascular: Negative.  Negative for chest pain, palpitations and leg swelling.  Gastrointestinal: Negative.  Negative for nausea, vomiting, abdominal pain, diarrhea and constipation.  Endocrine: Positive for polydipsia, polyphagia and polyuria.  Musculoskeletal: Negative.  Negative for myalgias and back pain.  Skin: Negative.    Allergic/Immunologic: Negative.   Neurological: Negative.  Negative for weakness.  Hematological: Negative.  Negative for adenopathy. Does not bruise/bleed easily.  Psychiatric/Behavioral: Negative.        Objective:   Physical Exam  Vitals reviewed. Constitutional: She is oriented to person, place, and time. She appears well-developed and well-nourished. No distress.  HENT:  Head: Normocephalic and atraumatic.  Mouth/Throat: Oropharynx is clear and moist. No oropharyngeal exudate.  Eyes: Conjunctivae are normal. Right eye exhibits no discharge. Left eye exhibits no discharge. No scleral icterus.  Neck: Normal range of motion. Neck supple. No JVD present. No tracheal deviation present. No thyromegaly present.  Cardiovascular: Normal rate, regular rhythm, normal heart sounds and intact distal pulses.  Exam reveals no gallop and no friction rub.   No murmur heard. Pulmonary/Chest: Effort normal and breath sounds normal. No stridor. No respiratory distress. She has no wheezes. She has no rales. She exhibits no tenderness.  Abdominal: Soft. Bowel sounds are normal. She exhibits no distension and no mass. There is no tenderness. There is no rebound and no guarding.  Musculoskeletal: Normal range of motion. She exhibits no edema and no tenderness.  Lymphadenopathy:    She has no cervical adenopathy.  Neurological: She is oriented to person, place, and time.  Skin: Skin is warm and dry. No rash noted. She is not diaphoretic. No erythema. No pallor.  Psychiatric: She has a normal mood and affect. Her behavior is normal. Judgment and thought content normal.     Lab Results  Component Value Date   WBC 3.6* 04/03/2012   HGB 13.5 04/03/2012   HCT 40.5 04/03/2012   PLT 179.0 04/03/2012   GLUCOSE 245* 07/02/2012   CHOL 218* 04/03/2012   TRIG 38.0 04/03/2012   HDL 79.20 04/03/2012  LDLDIRECT 123.9 04/03/2012   ALT 19 07/02/2012   AST 15 07/02/2012   NA 137 07/02/2012   K 4.3 07/02/2012   CL  103 07/02/2012   CREATININE 0.7 07/02/2012   BUN 21 07/02/2012   CO2 30 07/02/2012   TSH 0.60 04/03/2012   HGBA1C 8.1* 07/02/2012   MICROALBUR 0.2 04/03/2012       Assessment & Plan:

## 2013-04-15 NOTE — Patient Instructions (Signed)
Type 2 Diabetes Mellitus, Adult Type 2 diabetes mellitus, often simply referred to as type 2 diabetes, is a long-lasting (chronic) disease. In type 2 diabetes, the pancreas does not make enough insulin (a hormone), the cells are less responsive to the insulin that is made (insulin resistance), or both. Normally, insulin moves sugars from food into the tissue cells. The tissue cells use the sugars for energy. The lack of insulin or the lack of normal response to insulin causes excess sugars to build up in the blood instead of going into the tissue cells. As a result, high blood sugar (hyperglycemia) develops. The effect of high sugar (glucose) levels can cause many complications. Type 2 diabetes was also previously called adult-onset diabetes but it can occur at any age.  RISK FACTORS  A person is predisposed to developing type 2 diabetes if someone in the family has the disease and also has one or more of the following primary risk factors:  Overweight.  An inactive lifestyle.  A history of consistently eating high-calorie foods. Maintaining a normal weight and regular physical activity can reduce the chance of developing type 2 diabetes. SYMPTOMS  A person with type 2 diabetes may not show symptoms initially. The symptoms of type 2 diabetes appear slowly. The symptoms include:  Increased thirst (polydipsia).  Increased urination (polyuria).  Increased urination during the night (nocturia).  Weight loss. This weight loss may be rapid.  Frequent, recurring infections.  Tiredness (fatigue).  Weakness.  Vision changes, such as blurred vision.  Fruity smell to your breath.  Abdominal pain.  Nausea or vomiting.  Cuts or bruises which are slow to heal.  Tingling or numbness in the hands or feet. DIAGNOSIS Type 2 diabetes is frequently not diagnosed until complications of diabetes are present. Type 2 diabetes is diagnosed when symptoms or complications are present and when blood  glucose levels are increased. Your blood glucose level may be checked by one or more of the following blood tests:  A fasting blood glucose test. You will not be allowed to eat for at least 8 hours before a blood sample is taken.  A random blood glucose test. Your blood glucose is checked at any time of the day regardless of when you ate.  A hemoglobin A1c blood glucose test. A hemoglobin A1c test provides information about blood glucose control over the previous 3 months.  An oral glucose tolerance test (OGTT). Your blood glucose is measured after you have not eaten (fasted) for 2 hours and then after you drink a glucose-containing beverage. TREATMENT   You may need to take insulin or diabetes medicine daily to keep blood glucose levels in the desired range.  You will need to match insulin dosing with exercise and healthy food choices. The treatment goal is to maintain the before meal blood sugar (preprandial glucose) level at 70 130 mg/dL. HOME CARE INSTRUCTIONS   Have your hemoglobin A1c level checked twice a year.  Perform daily blood glucose monitoring as directed by your caregiver.  Monitor urine ketones when you are ill and as directed by your caregiver.  Take your diabetes medicine or insulin as directed by your caregiver to maintain your blood glucose levels in the desired range.  Never run out of diabetes medicine or insulin. It is needed every day.  Adjust insulin based on your intake of carbohydrates. Carbohydrates can raise blood glucose levels but need to be included in your diet. Carbohydrates provide vitamins, minerals, and fiber which are an essential part of   a healthy diet. Carbohydrates are found in fruits, vegetables, whole grains, dairy products, legumes, and foods containing added sugars.    Eat healthy foods. Alternate 3 meals with 3 snacks.  Lose weight if overweight.  Carry a medical alert card or wear your medical alert jewelry.  Carry a 15 gram  carbohydrate snack with you at all times to treat low blood glucose (hypoglycemia). Some examples of 15 gram carbohydrate snacks include:  Glucose tablets, 3 or 4   Glucose gel, 15 gram tube  Raisins, 2 tablespoons (24 grams)  Jelly beans, 6  Animal crackers, 8  Regular pop, 4 ounces (120 mL)  Gummy treats, 9  Recognize hypoglycemia. Hypoglycemia occurs with blood glucose levels of 70 mg/dL and below. The risk for hypoglycemia increases when fasting or skipping meals, during or after intense exercise, and during sleep. Hypoglycemia symptoms can include:  Tremors or shakes.  Decreased ability to concentrate.  Sweating.  Increased heart rate.  Headache.  Dry mouth.  Hunger.  Irritability.  Anxiety.  Restless sleep.  Altered speech or coordination.  Confusion.  Treat hypoglycemia promptly. If you are alert and able to safely swallow, follow the 15:15 rule:  Take 15 20 grams of rapid-acting glucose or carbohydrate. Rapid-acting options include glucose gel, glucose tablets, or 4 ounces (120 mL) of fruit juice, regular soda, or low fat milk.  Check your blood glucose level 15 minutes after taking the glucose.  Take 15 20 grams more of glucose if the repeat blood glucose level is still 70 mg/dL or below.  Eat a meal or snack within 1 hour once blood glucose levels return to normal.    Be alert to polyuria and polydipsia which are early signs of hyperglycemia. An early awareness of hyperglycemia allows for prompt treatment. Treat hyperglycemia as directed by your caregiver.  Engage in at least 150 minutes of moderate-intensity physical activity a week, spread over at least 3 days of the week or as directed by your caregiver. In addition, you should engage in resistance exercise at least 2 times a week or as directed by your caregiver.  Adjust your medicine and food intake as needed if you start a new exercise or sport.  Follow your sick day plan at any time you  are unable to eat or drink as usual.  Avoid tobacco use.  Limit alcohol intake to no more than 1 drink per day for nonpregnant women and 2 drinks per day for men. You should drink alcohol only when you are also eating food. Talk with your caregiver whether alcohol is safe for you. Tell your caregiver if you drink alcohol several times a week.  Follow up with your caregiver regularly.  Schedule an eye exam soon after the diagnosis of type 2 diabetes and then annually.  Perform daily skin and foot care. Examine your skin and feet daily for cuts, bruises, redness, nail problems, bleeding, blisters, or sores. A foot exam by a caregiver should be done annually.  Brush your teeth and gums at least twice a day and floss at least once a day. Follow up with your dentist regularly.  Share your diabetes management plan with your workplace or school.  Stay up-to-date with immunizations.  Learn to manage stress.  Obtain ongoing diabetes education and support as needed.  Participate in, or seek rehabilitation as needed to maintain or improve independence and quality of life. Request a physical or occupational therapy referral if you are having foot or hand numbness or difficulties with grooming,   dressing, eating, or physical activity. SEEK MEDICAL CARE IF:   You are unable to eat food or drink fluids for more than 6 hours.  You have nausea and vomiting for more than 6 hours.  Your blood glucose level is over 240 mg/dL.  There is a change in mental status.  You develop an additional serious illness.  You have diarrhea for more than 6 hours.  You have been sick or have had a fever for a couple of days and are not getting better.  You have pain during any physical activity.  SEEK IMMEDIATE MEDICAL CARE IF:  You have difficulty breathing.  You have moderate to large ketone levels. MAKE SURE YOU:  Understand these instructions.  Will watch your condition.  Will get help right away if  you are not doing well or get worse. Document Released: 09/03/2005 Document Revised: 05/28/2012 Document Reviewed: 04/01/2012 [employer] Patient Information 2014 [employer].  

## 2013-04-16 ENCOUNTER — Encounter: Payer: Self-pay | Admitting: Internal Medicine

## 2013-04-16 LAB — C-PEPTIDE: C-Peptide: 0.96 ng/mL (ref 0.80–3.90)

## 2013-04-16 NOTE — Assessment & Plan Note (Signed)
She will start vytorin

## 2013-04-16 NOTE — Assessment & Plan Note (Signed)
She wants to try phentermine to lose weight

## 2013-04-16 NOTE — Assessment & Plan Note (Signed)
Her BP is well controlled 

## 2013-04-16 NOTE — Assessment & Plan Note (Signed)
Her blood sugar is not well controlled Her C-peptide is low so will consider starting insulin soon

## 2013-05-08 ENCOUNTER — Encounter: Payer: Self-pay | Admitting: Internal Medicine

## 2013-06-17 ENCOUNTER — Other Ambulatory Visit (INDEPENDENT_AMBULATORY_CARE_PROVIDER_SITE_OTHER): Payer: 59

## 2013-06-17 ENCOUNTER — Encounter: Payer: Self-pay | Admitting: Internal Medicine

## 2013-06-17 ENCOUNTER — Ambulatory Visit (INDEPENDENT_AMBULATORY_CARE_PROVIDER_SITE_OTHER): Payer: 59 | Admitting: Internal Medicine

## 2013-06-17 VITALS — BP 110/72 | HR 81 | Temp 97.5°F | Resp 16 | Ht 65.0 in | Wt 178.0 lb

## 2013-06-17 DIAGNOSIS — I10 Essential (primary) hypertension: Secondary | ICD-10-CM

## 2013-06-17 DIAGNOSIS — Z23 Encounter for immunization: Secondary | ICD-10-CM

## 2013-06-17 DIAGNOSIS — IMO0001 Reserved for inherently not codable concepts without codable children: Secondary | ICD-10-CM

## 2013-06-17 DIAGNOSIS — E669 Obesity, unspecified: Secondary | ICD-10-CM

## 2013-06-17 DIAGNOSIS — E66811 Obesity, class 1: Secondary | ICD-10-CM

## 2013-06-17 LAB — BASIC METABOLIC PANEL
BUN: 11 mg/dL (ref 6–23)
Chloride: 105 mEq/L (ref 96–112)
GFR: 111.29 mL/min (ref >60.00)
Glucose, Bld: 108 mg/dL — ABNORMAL HIGH (ref 70–99)
Potassium: 3.7 mEq/L (ref 3.5–5.1)
Sodium: 137 mEq/L (ref 135–145)

## 2013-06-17 LAB — HM DIABETES FOOT EXAM

## 2013-06-17 MED ORDER — PHENTERMINE HCL 37.5 MG PO CAPS: 37.5000 mg | ORAL_CAPSULE | ORAL | Status: DC

## 2013-06-17 MED ORDER — ALOGLIPTIN-METFORMIN HCL 12.5-500 MG PO TABS: 1.0000 | ORAL_TABLET | Freq: Two times a day (BID) | ORAL | Status: DC

## 2013-06-17 NOTE — Assessment & Plan Note (Signed)
With the use of adipex she has lost weight and she is tolerating it well Will continue adipex for 2 more months

## 2013-06-17 NOTE — Assessment & Plan Note (Signed)
I will check her A1C and will monitor her renal function 

## 2013-06-17 NOTE — Assessment & Plan Note (Signed)
Her BP is well controlled I will check her lytes and renal function today 

## 2013-06-17 NOTE — Progress Notes (Signed)
Subjective:    Patient ID: Virginia Jimenez, female    DOB: [birth year], 52 y.o.   MRN: [id number]  Diabetes She presents for her follow-up diabetic visit. She has type 2 diabetes mellitus. Her disease course has been stable. There are no hypoglycemic associated symptoms. Pertinent negatives for hypoglycemia include no dizziness, headaches or tremors. Pertinent negatives for diabetes include no blurred vision, no chest pain, no fatigue, no foot paresthesias, no foot ulcerations, no polydipsia, no polyphagia, no polyuria, no visual change, no weakness and no weight loss. There are no hypoglycemic complications. There are no diabetic complications. Current diabetic treatment includes oral agent (dual therapy). She is compliant with treatment all of the time. Her weight is stable. She is following a generally healthy diet. Meal planning includes avoidance of concentrated sweets. She has not had a previous visit with a dietician. She participates in exercise three times a week. There is no change in her home blood glucose trend. Her breakfast blood glucose range is generally 90-110 mg/dl. Her lunch blood glucose range is generally 110-130 mg/dl. Her dinner blood glucose range is generally 130-140 mg/dl. Her highest blood glucose is 130-140 mg/dl. Her overall blood glucose range is 130-140 mg/dl. An ACE inhibitor/angiotensin II receptor blocker is being taken. She does not see a podiatrist.Eye exam is current.      Review of Systems  Constitutional: Negative.  Negative for fever, chills, weight loss, diaphoresis, activity change, appetite change, fatigue and unexpected weight change.  HENT: Negative.   Eyes: Negative.  Negative for blurred vision.  Respiratory: Negative.  Negative for cough, chest tightness, shortness of breath, wheezing and stridor.   Cardiovascular: Negative.  Negative for chest pain, palpitations and leg swelling.  Gastrointestinal: Negative.  Negative for nausea, vomiting, abdominal  pain, diarrhea and constipation.  Endocrine: Negative.  Negative for polydipsia, polyphagia and polyuria.  Genitourinary: Negative.   Musculoskeletal: Negative.  Negative for myalgias, back pain, joint swelling and gait problem.  Skin: Negative.   Allergic/Immunologic: Negative.   Neurological: Negative.  Negative for dizziness, tremors, syncope, weakness, light-headedness, numbness and headaches.  Hematological: Negative.  Negative for adenopathy. Does not bruise/bleed easily.  Psychiatric/Behavioral: Negative.        Objective:   Physical Exam  Vitals reviewed. Constitutional: She is oriented to person, place, and time. She appears well-developed and well-nourished. No distress.  HENT:  Head: Normocephalic and atraumatic.  Mouth/Throat: Oropharynx is clear and moist. No oropharyngeal exudate.  Eyes: Conjunctivae are normal. Right eye exhibits no discharge. Left eye exhibits no discharge. No scleral icterus.  Neck: Normal range of motion. Neck supple. No JVD present. No tracheal deviation present. No thyromegaly present.  Cardiovascular: Normal rate, regular rhythm, normal heart sounds and intact distal pulses.  Exam reveals no gallop and no friction rub.   No murmur heard. Pulmonary/Chest: Effort normal and breath sounds normal. No stridor. No respiratory distress. She has no wheezes. She has no rales. She exhibits no tenderness.  Abdominal: Soft. Bowel sounds are normal. She exhibits no distension and no mass. There is no tenderness. There is no rebound and no guarding.  Musculoskeletal: Normal range of motion. She exhibits no edema and no tenderness.  Lymphadenopathy:    She has no cervical adenopathy.  Neurological: She is oriented to person, place, and time.  Skin: Skin is warm and dry. No rash noted. She is not diaphoretic. No erythema. No pallor.  Psychiatric: She has a normal mood and affect. Her behavior is normal. Judgment and thought content normal.  Lab Results   Component Value Date   WBC 3.4* 04/15/2013   HGB 13.4 04/15/2013   HCT 39.0 04/15/2013   PLT 175.0 04/15/2013   GLUCOSE 215* 04/15/2013   CHOL 229* 04/15/2013   TRIG 35.0 04/15/2013   HDL 77.40 04/15/2013   LDLDIRECT 138.6 04/15/2013   ALT 20 04/15/2013   AST 19 04/15/2013   NA 138 04/15/2013   K 4.3 04/15/2013   CL 103 04/15/2013   CREATININE 0.7 04/15/2013   BUN 14 04/15/2013   CO2 29 04/15/2013   TSH 0.90 04/15/2013   HGBA1C 9.0* 04/15/2013   MICROALBUR 0.3 04/15/2013       Assessment & Plan:

## 2013-07-20 ENCOUNTER — Other Ambulatory Visit: Payer: Self-pay | Admitting: Internal Medicine

## 2013-07-20 DIAGNOSIS — Z1231 Encounter for screening mammogram for malignant neoplasm of breast: Secondary | ICD-10-CM

## 2013-08-05 ENCOUNTER — Ambulatory Visit (HOSPITAL_COMMUNITY): Payer: 59

## 2013-08-26 ENCOUNTER — Ambulatory Visit (HOSPITAL_COMMUNITY): Payer: 59

## 2013-09-22 ENCOUNTER — Ambulatory Visit (HOSPITAL_COMMUNITY)
Admission: RE | Admit: 2013-09-22 | Discharge: 2013-09-22 | Disposition: A | Discharge status: Home / Self Care | Payer: 59 | Source: Ambulatory Visit | Attending: Internal Medicine | Admitting: Internal Medicine

## 2013-09-22 DIAGNOSIS — Z1231 Encounter for screening mammogram for malignant neoplasm of breast: Secondary | ICD-10-CM

## 2013-09-22 LAB — HM MAMMOGRAPHY: HM MAMMO: NORMAL

## 2013-12-30 ENCOUNTER — Other Ambulatory Visit (INDEPENDENT_AMBULATORY_CARE_PROVIDER_SITE_OTHER): Payer: 59

## 2013-12-30 ENCOUNTER — Encounter: Payer: Self-pay | Admitting: Internal Medicine

## 2013-12-30 ENCOUNTER — Ambulatory Visit (INDEPENDENT_AMBULATORY_CARE_PROVIDER_SITE_OTHER): Payer: 59 | Admitting: Internal Medicine

## 2013-12-30 VITALS — BP 160/102 | HR 76 | Temp 97.7°F | Resp 16 | Ht 65.0 in | Wt 166.0 lb

## 2013-12-30 DIAGNOSIS — E669 Obesity, unspecified: Secondary | ICD-10-CM

## 2013-12-30 DIAGNOSIS — E66811 Obesity, class 1: Secondary | ICD-10-CM

## 2013-12-30 DIAGNOSIS — E1165 Type 2 diabetes mellitus with hyperglycemia: Principal | ICD-10-CM

## 2013-12-30 DIAGNOSIS — IMO0001 Reserved for inherently not codable concepts without codable children: Secondary | ICD-10-CM

## 2013-12-30 DIAGNOSIS — I10 Essential (primary) hypertension: Secondary | ICD-10-CM

## 2013-12-30 DIAGNOSIS — E785 Hyperlipidemia, unspecified: Secondary | ICD-10-CM

## 2013-12-30 LAB — LIPID PANEL
CHOL/HDL RATIO: 3
Cholesterol: 223 mg/dL — ABNORMAL HIGH (ref 0–200)
HDL: 89.2 mg/dL (ref >39.00)
LDL Cholesterol: 128 mg/dL — ABNORMAL HIGH (ref 0–99)
Triglycerides: 28 mg/dL (ref 0.0–149.0)
VLDL: 5.6 mg/dL (ref 0.0–40.0)

## 2013-12-30 LAB — BASIC METABOLIC PANEL
BUN: 13 mg/dL (ref 6–23)
CALCIUM: 9.9 mg/dL (ref 8.4–10.5)
CO2: 29 mEq/L (ref 19–32)
Chloride: 103 mEq/L (ref 96–112)
Creatinine, Ser: 0.7 mg/dL (ref 0.4–1.2)
GFR: 118.74 mL/min (ref >60.00)
Glucose, Bld: 116 mg/dL — ABNORMAL HIGH (ref 70–99)
Potassium: 4.4 mEq/L (ref 3.5–5.1)
Sodium: 138 mEq/L (ref 135–145)

## 2013-12-30 LAB — HEMOGLOBIN A1C: Hgb A1c MFr Bld: 6.2 % (ref 4.6–6.5)

## 2013-12-30 LAB — TSH: TSH: 0.62 u[IU]/mL (ref 0.35–5.50)

## 2013-12-30 MED ORDER — EZETIMIBE-SIMVASTATIN 10-20 MG PO TABS: 1.0000 | ORAL_TABLET | Freq: Every day | ORAL | Status: DC

## 2013-12-30 MED ORDER — IRBESARTAN 150 MG PO TABS: 150.0000 mg | ORAL_TABLET | Freq: Every day | ORAL | Status: DC

## 2013-12-30 MED ORDER — AMLODIPINE BESYLATE 5 MG PO TABS: 5.0000 mg | ORAL_TABLET | Freq: Every day | ORAL | Status: DC

## 2013-12-30 MED ORDER — AMLODIPINE BESYLATE 5 MG PO TABS
5.0000 mg | ORAL_TABLET | Freq: Every day | ORAL | Status: DC
Start: 2013-12-30 — End: 2013-12-30

## 2013-12-30 MED ORDER — ALOGLIPTIN-METFORMIN HCL 12.5-500 MG PO TABS: 1.0000 | ORAL_TABLET | Freq: Two times a day (BID) | ORAL | Status: DC

## 2013-12-30 NOTE — Assessment & Plan Note (Addendum)
She will restart meds Will recheck her A1C and her renal function today  Late note, her A1C is down so I have asked to stop taking kazano

## 2013-12-30 NOTE — Patient Instructions (Signed)

## 2013-12-30 NOTE — Assessment & Plan Note (Signed)
Improvement noted 

## 2013-12-30 NOTE — Progress Notes (Signed)
Pre visit review using our clinic review tool, if applicable. No additional management support is needed unless otherwise documented below in the visit note. 

## 2013-12-30 NOTE — Progress Notes (Signed)
Subjective:    Patient ID: Virginia Jimenez, female    DOB: 1961/06/29, 53 y.o.   MRN: [id number]  HPI Comments: She has not taken meds for several weeks, BP has not been well controlled. She wants to restart meds.  Hypertension This is a chronic problem. The current episode started more than 1 year ago. The problem has been gradually worsening since onset. The problem is uncontrolled. Pertinent negatives include no anxiety, blurred vision, chest pain, headaches, malaise/fatigue, neck pain, orthopnea, palpitations, peripheral edema, PND, shortness of breath or sweats. There are no associated agents to hypertension. Past treatments include angiotensin blockers and calcium channel blockers. The current treatment provides no improvement. Compliance problems include psychosocial issues.       Review of Systems  Constitutional: Negative.  Negative for fever, chills, malaise/fatigue, diaphoresis, appetite change and fatigue.  HENT: Negative.   Eyes: Negative.  Negative for blurred vision.  Respiratory: Negative.  Negative for apnea, cough, choking, chest tightness, shortness of breath, wheezing and stridor.   Cardiovascular: Negative.  Negative for chest pain, palpitations, orthopnea, leg swelling and PND.  Gastrointestinal: Negative.  Negative for nausea, vomiting, abdominal pain, diarrhea, constipation and blood in stool.  Endocrine: Negative.  Negative for polydipsia, polyphagia and polyuria.  Genitourinary: Negative.  Negative for dysuria, frequency, hematuria, flank pain, enuresis, difficulty urinating and dyspareunia.  Musculoskeletal: Negative.  Negative for arthralgias, myalgias and neck pain.  Skin: Negative.   Allergic/Immunologic: Negative.   Neurological: Negative.  Negative for dizziness, tremors, syncope, weakness, light-headedness, numbness and headaches.  Hematological: Negative.  Negative for adenopathy. Does not bruise/bleed easily.  Psychiatric/Behavioral: Negative.          Objective:   Physical Exam  Vitals reviewed. Constitutional: She is oriented to person, place, and time. She appears well-developed and well-nourished. No distress.  HENT:  Head: Normocephalic and atraumatic.  Mouth/Throat: Oropharynx is clear and moist. No oropharyngeal exudate.  Eyes: Conjunctivae are normal. Right eye exhibits no discharge. Left eye exhibits no discharge. No scleral icterus.  Neck: Normal range of motion. Neck supple. No JVD present. No tracheal deviation present. No thyromegaly present.  Cardiovascular: Normal rate, regular rhythm, normal heart sounds and intact distal pulses.  Exam reveals no gallop and no friction rub.   No murmur heard. Pulmonary/Chest: Effort normal and breath sounds normal. No stridor. No respiratory distress. She has no wheezes. She has no rales. She exhibits no tenderness.  Abdominal: Soft. Bowel sounds are normal. She exhibits no distension and no mass. There is no tenderness. There is no rebound and no guarding.  Musculoskeletal: Normal range of motion. She exhibits no edema and no tenderness.  Lymphadenopathy:    She has no cervical adenopathy.  Neurological: She is oriented to person, place, and time.  Skin: Skin is warm and dry. No rash noted. She is not diaphoretic. No erythema. No pallor.  Psychiatric: She has a normal mood and affect. Her behavior is normal. Judgment and thought content normal.     Lab Results  Component Value Date   WBC 3.4* 04/15/2013   HGB 13.4 04/15/2013   HCT 39.0 04/15/2013   PLT 175.0 04/15/2013   GLUCOSE 108* 06/17/2013   CHOL 229* 04/15/2013   TRIG 35.0 04/15/2013   HDL 77.40 04/15/2013   LDLDIRECT 138.6 04/15/2013   ALT 20 04/15/2013   AST 19 04/15/2013   NA 137 06/17/2013   K 3.7 06/17/2013   CL 105 06/17/2013   CREATININE 0.7 06/17/2013   BUN 11 06/17/2013  CO2 25 06/17/2013   TSH 0.90 04/15/2013   HGBA1C 7.1* 06/17/2013   MICROALBUR 0.3 04/15/2013       Assessment & Plan:

## 2013-12-30 NOTE — Assessment & Plan Note (Signed)
She will restart vytorin

## 2013-12-30 NOTE — Assessment & Plan Note (Signed)
Due to non-compliance her BP is not well controlled She will restart the ARB and CCB Today I will check her lytes and renal function

## 2014-03-23 ENCOUNTER — Telehealth: Payer: Self-pay | Admitting: *Deleted

## 2014-03-23 NOTE — Telephone Encounter (Signed)
Left message on machine for patient to schedule an appointment to follow up with blood pressure. Message sent to mychart Diabetic bundle

## 2014-04-07 ENCOUNTER — Other Ambulatory Visit: Payer: Self-pay

## 2014-04-07 DIAGNOSIS — I10 Essential (primary) hypertension: Secondary | ICD-10-CM

## 2014-04-07 MED ORDER — AMLODIPINE BESYLATE 5 MG PO TABS
5.0000 mg | ORAL_TABLET | Freq: Every day | ORAL | Status: DC
Start: 2014-04-07 — End: 2015-08-17

## 2014-04-14 ENCOUNTER — Encounter: Payer: Self-pay | Admitting: Internal Medicine

## 2014-04-14 ENCOUNTER — Ambulatory Visit (INDEPENDENT_AMBULATORY_CARE_PROVIDER_SITE_OTHER): Payer: 59 | Admitting: Internal Medicine

## 2014-04-14 VITALS — BP 130/70 | HR 88 | Temp 98.2°F | Resp 16 | Ht 65.0 in | Wt 169.0 lb

## 2014-04-14 DIAGNOSIS — E669 Obesity, unspecified: Secondary | ICD-10-CM

## 2014-04-14 DIAGNOSIS — I10 Essential (primary) hypertension: Secondary | ICD-10-CM

## 2014-04-14 DIAGNOSIS — IMO0001 Reserved for inherently not codable concepts without codable children: Secondary | ICD-10-CM

## 2014-04-14 DIAGNOSIS — E1165 Type 2 diabetes mellitus with hyperglycemia: Secondary | ICD-10-CM

## 2014-04-14 MED ORDER — PHENTERMINE-TOPIRAMATE ER 3.75-23 MG PO CP24: 1.0000 | ORAL_CAPSULE | Freq: Every day | ORAL | Status: DC

## 2014-04-14 MED ORDER — AMLODIPINE BESYLATE 5 MG PO TABS: 5.0000 mg | ORAL_TABLET | Freq: Every day | ORAL | Status: DC

## 2014-04-14 MED ORDER — EZETIMIBE-SIMVASTATIN 10-20 MG PO TABS: 1.0000 | ORAL_TABLET | Freq: Every day | ORAL | Status: DC

## 2014-04-14 MED ORDER — IRBESARTAN 150 MG PO TABS: 150.0000 mg | ORAL_TABLET | Freq: Every day | ORAL | Status: DC

## 2014-04-14 MED ORDER — PHENTERMINE-TOPIRAMATE ER 7.5-46 MG PO CP24: 1.0000 | ORAL_CAPSULE | Freq: Every day | ORAL | Status: DC

## 2014-04-14 NOTE — Progress Notes (Signed)
   Subjective:    Patient ID: Virginia Jimenez, female    DOB: [birth year], 53 y.o.   MRN: [id number]  Hypertension This is a chronic problem. The current episode started more than 1 year ago. The problem is unchanged. The problem is controlled. Pertinent negatives include no anxiety, blurred vision, chest pain, headaches, malaise/fatigue, neck pain, orthopnea, palpitations, peripheral edema, PND, shortness of breath or sweats. There are no associated agents to hypertension. Past treatments include calcium channel blockers and angiotensin blockers. The current treatment provides significant improvement. There are no compliance problems.       Review of Systems  Constitutional: Negative.  Negative for fever, chills, malaise/fatigue, diaphoresis, appetite change and fatigue.  HENT: Negative.   Eyes: Negative.  Negative for blurred vision.  Respiratory: Negative.  Negative for cough, chest tightness, shortness of breath and stridor.   Cardiovascular: Negative.  Negative for chest pain, palpitations, orthopnea, leg swelling and PND.  Gastrointestinal: Negative.  Negative for nausea, vomiting, abdominal pain, diarrhea, constipation and blood in stool.  Endocrine: Negative.   Genitourinary: Negative.   Musculoskeletal: Negative.  Negative for arthralgias, back pain and neck pain.  Skin: Negative.  Negative for rash.  Allergic/Immunologic: Negative.   Neurological: Negative.  Negative for headaches.  Hematological: Negative.  Negative for adenopathy. Does not bruise/bleed easily.  Psychiatric/Behavioral: Negative.        Objective:   Physical Exam  Vitals reviewed. Constitutional: She is oriented to person, place, and time. She appears well-developed and well-nourished. No distress.  HENT:  Head: Normocephalic and atraumatic.  Mouth/Throat: Oropharynx is clear and moist. No oropharyngeal exudate.  Eyes: Conjunctivae are normal. Right eye exhibits no discharge. Left eye exhibits no discharge.  No scleral icterus.  Neck: Normal range of motion. Neck supple. No JVD present. No tracheal deviation present. No thyromegaly present.  Cardiovascular: Normal rate, regular rhythm, normal heart sounds and intact distal pulses.  Exam reveals no gallop and no friction rub.   No murmur heard. Pulmonary/Chest: Effort normal and breath sounds normal. No stridor. No respiratory distress. She has no wheezes. She has no rales. She exhibits no tenderness.  Abdominal: Soft. Bowel sounds are normal. She exhibits no distension and no mass. There is no tenderness. There is no rebound and no guarding.  Musculoskeletal: Normal range of motion. She exhibits no edema and no tenderness.  Lymphadenopathy:    She has no cervical adenopathy.  Neurological: She is oriented to person, place, and time.  Skin: Skin is warm and dry. No rash noted. She is not diaphoretic. No erythema. No pallor.  Psychiatric: She has a normal mood and affect. Her behavior is normal. Judgment and thought content normal.     Lab Results  Component Value Date   WBC 3.4* 04/15/2013   HGB 13.4 04/15/2013   HCT 39.0 04/15/2013   PLT 175.0 04/15/2013   GLUCOSE 116* 12/30/2013   CHOL 223* 12/30/2013   TRIG 28.0 12/30/2013   HDL 89.20 12/30/2013   LDLDIRECT 138.6 04/15/2013   LDLCALC 128* 12/30/2013   ALT 20 04/15/2013   AST 19 04/15/2013   NA 138 12/30/2013   K 4.4 12/30/2013   CL 103 12/30/2013   CREATININE 0.7 12/30/2013   BUN 13 12/30/2013   CO2 29 12/30/2013   TSH 0.62 12/30/2013   HGBA1C 6.2 12/30/2013   MICROALBUR 0.3 04/15/2013       Assessment & Plan:

## 2014-04-14 NOTE — Patient Instructions (Signed)

## 2014-04-14 NOTE — Progress Notes (Signed)
Pre visit review using our clinic review tool, if applicable. No additional management support is needed unless otherwise documented below in the visit note. 

## 2014-04-15 ENCOUNTER — Encounter: Payer: Self-pay | Admitting: Internal Medicine

## 2014-04-15 NOTE — Assessment & Plan Note (Signed)
Her blood sugars are well controlled 

## 2014-04-15 NOTE — Assessment & Plan Note (Signed)
She will try Qsymia to help her lose weight

## 2014-04-15 NOTE — Assessment & Plan Note (Signed)
Her BP is well controlled 

## 2014-07-19 ENCOUNTER — Encounter: Payer: Self-pay | Admitting: Internal Medicine

## 2014-07-23 ENCOUNTER — Other Ambulatory Visit: Payer: Self-pay | Admitting: Internal Medicine

## 2014-08-19 ENCOUNTER — Other Ambulatory Visit: Payer: Self-pay | Admitting: Internal Medicine

## 2014-12-24 ENCOUNTER — Other Ambulatory Visit: Payer: Self-pay | Admitting: Internal Medicine

## 2014-12-30 ENCOUNTER — Other Ambulatory Visit: Payer: Self-pay | Admitting: Internal Medicine

## 2015-04-28 ENCOUNTER — Encounter: Payer: Self-pay | Admitting: Internal Medicine

## 2015-04-28 ENCOUNTER — Other Ambulatory Visit (INDEPENDENT_AMBULATORY_CARE_PROVIDER_SITE_OTHER): Payer: 59

## 2015-04-28 ENCOUNTER — Ambulatory Visit (INDEPENDENT_AMBULATORY_CARE_PROVIDER_SITE_OTHER): Payer: 59 | Admitting: Internal Medicine

## 2015-04-28 VITALS — BP 130/88 | HR 71 | Temp 97.8°F | Resp 16 | Ht 65.0 in | Wt 189.0 lb

## 2015-04-28 DIAGNOSIS — E118 Type 2 diabetes mellitus with unspecified complications: Secondary | ICD-10-CM

## 2015-04-28 DIAGNOSIS — E785 Hyperlipidemia, unspecified: Secondary | ICD-10-CM

## 2015-04-28 DIAGNOSIS — I10 Essential (primary) hypertension: Secondary | ICD-10-CM | POA: Diagnosis not present

## 2015-04-28 DIAGNOSIS — F32A Depression, unspecified: Secondary | ICD-10-CM

## 2015-04-28 DIAGNOSIS — K219 Gastro-esophageal reflux disease without esophagitis: Secondary | ICD-10-CM | POA: Diagnosis not present

## 2015-04-28 DIAGNOSIS — F329 Major depressive disorder, single episode, unspecified: Secondary | ICD-10-CM

## 2015-04-28 DIAGNOSIS — E669 Obesity, unspecified: Secondary | ICD-10-CM

## 2015-04-28 LAB — URINALYSIS, ROUTINE W REFLEX MICROSCOPIC
BILIRUBIN URINE: NEGATIVE
Hgb urine dipstick: NEGATIVE
Ketones, ur: NEGATIVE
LEUKOCYTES UA: NEGATIVE
NITRITE: NEGATIVE
RBC / HPF: NONE SEEN (ref 0–?)
SPECIFIC GRAVITY, URINE: 1.01 (ref 1.000–1.030)
TOTAL PROTEIN, URINE-UPE24: NEGATIVE
Urine Glucose: NEGATIVE
Urobilinogen, UA: 0.2 (ref 0.0–1.0)
WBC, UA: NONE SEEN (ref 0–?)
pH: 6 (ref 5.0–8.0)

## 2015-04-28 LAB — CBC WITH DIFFERENTIAL/PLATELET
BASOS ABS: 0 10*3/uL (ref 0.0–0.1)
Basophils Relative: 1 % (ref 0.0–3.0)
Eosinophils Absolute: 0.1 10*3/uL (ref 0.0–0.7)
Eosinophils Relative: 2.9 % (ref 0.0–5.0)
HCT: 39.4 % (ref 36.0–46.0)
Hemoglobin: 13.3 g/dL (ref 12.0–15.0)
LYMPHS ABS: 1.4 10*3/uL (ref 0.7–4.0)
Lymphocytes Relative: 55.7 % — ABNORMAL HIGH (ref 12.0–46.0)
MCHC: 33.8 g/dL (ref 30.0–36.0)
MCV: 94.3 fl (ref 78.0–100.0)
MONO ABS: 0.3 10*3/uL (ref 0.1–1.0)
Monocytes Relative: 11.2 % (ref 3.0–12.0)
Neutro Abs: 0.7 10*3/uL — ABNORMAL LOW (ref 1.4–7.7)
Neutrophils Relative %: 29.2 % — ABNORMAL LOW (ref 43.0–77.0)
PLATELETS: 183 10*3/uL (ref 150.0–400.0)
RBC: 4.17 Mil/uL (ref 3.87–5.11)
RDW: 13.6 % (ref 11.5–15.5)

## 2015-04-28 LAB — COMPREHENSIVE METABOLIC PANEL
ALBUMIN: 4.1 g/dL (ref 3.5–5.2)
ALT: 17 U/L (ref 0–35)
AST: 16 U/L (ref 0–37)
Alkaline Phosphatase: 88 U/L (ref 39–117)
BUN: 16 mg/dL (ref 6–23)
CALCIUM: 9.6 mg/dL (ref 8.4–10.5)
CHLORIDE: 104 meq/L (ref 96–112)
CO2: 29 mEq/L (ref 19–32)
Creatinine, Ser: 0.67 mg/dL (ref 0.40–1.20)
GFR: 118.14 mL/min (ref >60.00)
GLUCOSE: 147 mg/dL — AB (ref 70–99)
POTASSIUM: 4.1 meq/L (ref 3.5–5.1)
SODIUM: 140 meq/L (ref 135–145)
TOTAL PROTEIN: 7.8 g/dL (ref 6.0–8.3)
Total Bilirubin: 0.5 mg/dL (ref 0.2–1.2)

## 2015-04-28 LAB — LIPID PANEL
Cholesterol: 198 mg/dL (ref 0–200)
HDL: 77.1 mg/dL (ref >39.00)
LDL CALC: 109 mg/dL — AB (ref 0–99)
NonHDL: 120.63
Total CHOL/HDL Ratio: 3
Triglycerides: 57 mg/dL (ref 0.0–149.0)
VLDL: 11.4 mg/dL (ref 0.0–40.0)

## 2015-04-28 LAB — MICROALBUMIN / CREATININE URINE RATIO
Creatinine,U: 41.8 mg/dL
Microalb Creat Ratio: 1.7 mg/g (ref 0.0–30.0)

## 2015-04-28 LAB — TSH: TSH: 1.14 u[IU]/mL (ref 0.35–4.50)

## 2015-04-28 LAB — HEMOGLOBIN A1C: Hgb A1c MFr Bld: 6.4 % (ref 4.6–6.5)

## 2015-04-28 MED ORDER — AMLODIPINE BESYLATE 5 MG PO TABS: 5.0000 mg | ORAL_TABLET | Freq: Every day | ORAL | Status: DC

## 2015-04-28 MED ORDER — PHENTERMINE HCL 37.5 MG PO CAPS: 37.5000 mg | ORAL_CAPSULE | ORAL | Status: DC

## 2015-04-28 MED ORDER — IRBESARTAN 150 MG PO TABS: 150.0000 mg | ORAL_TABLET | Freq: Every day | ORAL | Status: DC

## 2015-04-28 MED ORDER — DEXLANSOPRAZOLE 60 MG PO CPDR: 60.0000 mg | DELAYED_RELEASE_CAPSULE | Freq: Every day | ORAL | Status: DC

## 2015-04-28 MED ORDER — VILAZODONE HCL 10 & 20 & 40 MG PO KIT: 1.0000 | PACK | Freq: Every day | ORAL | Status: DC

## 2015-04-28 NOTE — Progress Notes (Signed)
Subjective:  Patient ID: Virginia Jimenez, female    DOB: Jun 10, 1961  Age: 54 y.o. MRN: [id number]  CC: Hyperlipidemia; Diabetes; Hypertension; Gastrophageal Reflux; and Depression   HPI Virginia Jimenez presents for for follow-up and she complains of weight gain of about 20 pounds over the last few months. Her father died a month ago and since then she has been feeling more depressed with crying spells, anhedonia, sleeping too much, fatigue, excessive appetite, and weight gain. She has tried Viibryd before and had a good response, she wants to try that again.  Outpatient Prescriptions Prior to Visit  Medication Sig Dispense Refill  . Multiple Vitamin (MULITIVITAMIN WITH MINERALS) TABS Take 1 tablet by mouth daily.    . Omega-3 Fatty Acids (OMEGA 3 PO) Take 2 capsules by mouth daily.    Marland Kitchen dexlansoprazole (DEXILANT) 60 MG capsule Take 1 capsule (60 mg total) by mouth daily. 90 capsule 3  . ezetimibe-simvastatin (VYTORIN) 10-20 MG per tablet Take 1 tablet by mouth at bedtime. 90 tablet 3  . irbesartan (AVAPRO) 150 MG tablet Take 1 tablet (150 mg total) by mouth daily. 90 tablet 3  . Phentermine-Topiramate (QSYMIA) 3.75-23 MG CP24 Take 1 tablet by mouth daily. 14 capsule 0  . amLODipine (NORVASC) 5 MG tablet Take 1 tablet (5 mg total) by mouth daily. 90 tablet 3  . QSYMIA 7.5-46 MG CP24 TAKE 1 CAPSULE BY MOUTH DAILY 30 capsule 0   No facility-administered medications prior to visit.    ROS Review of Systems  Constitutional: Positive for appetite change, fatigue and unexpected weight change. Negative for fever, chills, diaphoresis and activity change.  HENT: Negative.   Eyes: Negative.   Respiratory: Negative.  Negative for cough, choking, chest tightness, shortness of breath and stridor.   Cardiovascular: Negative.  Negative for chest pain, palpitations and leg swelling.  Gastrointestinal: Negative.  Negative for nausea, vomiting, abdominal pain and diarrhea.  Endocrine: Negative.   Negative for polydipsia, polyphagia and polyuria.  Genitourinary: Negative.   Musculoskeletal: Negative.  Negative for myalgias, back pain and arthralgias.  Skin: Negative.  Negative for rash.  Allergic/Immunologic: Negative.   Neurological: Negative.  Negative for dizziness, tremors, weakness, light-headedness, numbness and headaches.  Hematological: Negative.  Negative for adenopathy. Does not bruise/bleed easily.  Psychiatric/Behavioral: Positive for sleep disturbance and dysphoric mood. Negative for suicidal ideas, hallucinations, behavioral problems, confusion, self-injury, decreased concentration and agitation. The patient is not nervous/anxious and is not hyperactive.     Objective:  BP 130/88 mmHg  Pulse 71  Temp(Src) 97.8 F (36.6 C) (Oral)  Resp 16  Ht '5\' 5"'  (1.651 m)  Wt 189 lb (85.73 kg)  BMI 31.45 kg/m2  SpO2 98%  LMP 06/01/2012  BP Readings from Last 3 Encounters:  04/28/15 130/88  04/14/14 130/70  12/30/13 160/102    Wt Readings from Last 3 Encounters:  04/28/15 189 lb (85.73 kg)  04/14/14 169 lb (76.658 kg)  12/30/13 166 lb (75.297 kg)    Physical Exam  Constitutional: She is oriented to person, place, and time. No distress.  HENT:  Mouth/Throat: Oropharynx is clear and moist. No oropharyngeal exudate.  Eyes: Conjunctivae are normal. Right eye exhibits no discharge. Left eye exhibits no discharge. No scleral icterus.  Neck: Normal range of motion. Neck supple. No JVD present. No tracheal deviation present. No thyromegaly present.  Cardiovascular: Normal rate, regular rhythm, normal heart sounds and intact distal pulses.  Exam reveals no gallop and no friction rub.   No murmur heard.  Pulmonary/Chest: Effort normal and breath sounds normal. No stridor. No respiratory distress. She has no wheezes. She has no rales. She exhibits no tenderness.  Abdominal: Soft. Bowel sounds are normal. She exhibits no distension and no mass. There is no tenderness. There is no  rebound and no guarding.  Musculoskeletal: Normal range of motion. She exhibits no edema or tenderness.  Lymphadenopathy:    She has no cervical adenopathy.  Neurological: She is oriented to person, place, and time.  Skin: Skin is warm and dry. No rash noted. She is not diaphoretic. No erythema. No pallor.  Psychiatric: Judgment and thought content normal. Her mood appears not anxious. Her affect is not blunt, not labile and not inappropriate. Her speech is not rapid and/or pressured and not delayed. She is not slowed. Cognition and memory are normal. She exhibits a depressed mood. She expresses no homicidal and no suicidal ideation. She expresses no suicidal plans and no homicidal plans. She is attentive.  Vitals reviewed.   Lab Results  Component Value Date   WBC 2.6 Repeated and verified X2.* 04/28/2015   HGB 13.3 04/28/2015   HCT 39.4 04/28/2015   PLT 183.0 04/28/2015   GLUCOSE 147* 04/28/2015   CHOL 198 04/28/2015   TRIG 57.0 04/28/2015   HDL 77.10 04/28/2015   LDLDIRECT 138.6 04/15/2013   LDLCALC 109* 04/28/2015   ALT 17 04/28/2015   AST 16 04/28/2015   NA 140 04/28/2015   K 4.1 04/28/2015   CL 104 04/28/2015   CREATININE 0.67 04/28/2015   BUN 16 04/28/2015   CO2 29 04/28/2015   TSH 1.14 04/28/2015   HGBA1C 6.4 04/28/2015   MICROALBUR <0.7 04/28/2015    Mm Digital Screening  09/22/2013   CLINICAL DATA:  Screening.  EXAM: DIGITAL SCREENING BILATERAL MAMMOGRAM WITH CAD  COMPARISON:  02/27/2007  ACR Breast Density Category b: There are scattered areas of fibroglandular density.  FINDINGS: There are no findings suspicious for malignancy. Images were processed with CAD.  IMPRESSION: No mammographic evidence of malignancy. A result letter of this screening mammogram will be mailed directly to the patient.  RECOMMENDATION: Screening mammogram in one year. (Code:SM-B-01Y)  BI-RADS CATEGORY  1: Negative   Electronically Signed   By: Ulyess Blossom M.D.   On: 09/22/2013 14:18     Assessment & Plan:   Virginia Jimenez was seen today for hyperlipidemia, diabetes, hypertension, gastrophageal reflux and depression.  Diagnoses and all orders for this visit:  Type II diabetes mellitus with manifestations- her blood sugars are adequately well controlled, she has no complications from the diabetes, she does not need a medication at this time. -     Lipid panel; Future -     Microalbumin / creatinine urine ratio; Future -     Hemoglobin A1c; Future -     Comprehensive metabolic panel; Future -     irbesartan (AVAPRO) 150 MG tablet; Take 1 tablet (150 mg total) by mouth daily. -     Ambulatory referral to Ophthalmology  Hyperlipidemia with target LDL less than 100- she has achieved her LDL goal and does not want to be on a statin anymore. -     Lipid panel; Future -     TSH; Future  Essential hypertension, benign- her blood pressure is well controlled, her electrolytes and renal function are stable. She will continue the ARB and calcium channel blocker -     Urinalysis, Routine w reflex microscopic (not at [facility]); Future -     Comprehensive metabolic panel;  Future -     CBC with Differential/Platelet; Future -     irbesartan (AVAPRO) 150 MG tablet; Take 1 tablet (150 mg total) by mouth daily. -     amLODipine (NORVASC) 5 MG tablet; Take 1 tablet (5 mg total) by mouth daily.  Gastroesophageal reflux disease without esophagitis -     dexlansoprazole (DEXILANT) 60 MG capsule; Take 1 capsule (60 mg total) by mouth daily.  Obesity (BMI 30.0-34.9)- he addition to lifestyle modifications she will start phentermine to help her lose weight -     phentermine 37.5 MG capsule; Take 1 capsule (37.5 mg total) by mouth every morning.  Depression, acute- she was given a sample of Viibryd, she will touch base with me in 3-4 weeks to let me know how she is responding to this and to decide if she will stay at the 20 mg or the 40 mg dose -     Vilazodone HCl 10 & 20 & 40 MG KIT; Take 1 tablet  by mouth daily.  I have discontinued Ms. Shrider's dexlansoprazole, ezetimibe-simvastatin, irbesartan, Phentermine-Topiramate, and QSYMIA. I am also having her start on irbesartan, phentermine, Vilazodone HCl, and dexlansoprazole. Additionally, I am having her maintain her multivitamin with minerals, Omega-3 Fatty Acids (OMEGA 3 PO), and amLODipine.  Meds ordered this encounter  Medications  . irbesartan (AVAPRO) 150 MG tablet    Sig: Take 1 tablet (150 mg total) by mouth daily.    Dispense:  90 tablet    Refill:  3  . amLODipine (NORVASC) 5 MG tablet    Sig: Take 1 tablet (5 mg total) by mouth daily.    Dispense:  90 tablet    Refill:  3  . phentermine 37.5 MG capsule    Sig: Take 1 capsule (37.5 mg total) by mouth every morning.    Dispense:  30 capsule    Refill:  1  . Vilazodone HCl 10 & 20 & 40 MG KIT    Sig: Take 1 tablet by mouth daily.    Dispense:  1 kit    Refill:  0  . dexlansoprazole (DEXILANT) 60 MG capsule    Sig: Take 1 capsule (60 mg total) by mouth daily.    Dispense:  90 capsule    Refill:  3     Follow-up: Return in about 4 weeks (around 05/26/2015).  Scarlette Calico, MD

## 2015-04-28 NOTE — Progress Notes (Signed)
Pre visit review using our clinic review tool, if applicable. No additional management support is needed unless otherwise documented below in the visit note. 

## 2015-04-28 NOTE — Patient Instructions (Signed)

## 2015-05-05 ENCOUNTER — Encounter: Payer: Self-pay | Admitting: Internal Medicine

## 2015-08-17 ENCOUNTER — Other Ambulatory Visit: Payer: Self-pay | Admitting: Internal Medicine

## 2015-11-25 ENCOUNTER — Encounter: Payer: Self-pay | Admitting: Internal Medicine

## 2015-11-25 ENCOUNTER — Ambulatory Visit (INDEPENDENT_AMBULATORY_CARE_PROVIDER_SITE_OTHER): Payer: 59 | Admitting: Internal Medicine

## 2015-11-25 VITALS — BP 138/82 | HR 78 | Temp 98.5°F | Resp 20 | Wt 194.0 lb

## 2015-11-25 DIAGNOSIS — E669 Obesity, unspecified: Secondary | ICD-10-CM | POA: Diagnosis not present

## 2015-11-25 DIAGNOSIS — I10 Essential (primary) hypertension: Secondary | ICD-10-CM

## 2015-11-25 DIAGNOSIS — H1033 Unspecified acute conjunctivitis, bilateral: Secondary | ICD-10-CM | POA: Diagnosis not present

## 2015-11-25 DIAGNOSIS — E118 Type 2 diabetes mellitus with unspecified complications: Secondary | ICD-10-CM | POA: Diagnosis not present

## 2015-11-25 MED ORDER — TOBRAMYCIN 0.3 % OP SOLN: 1.0000 [drp] | Freq: Four times a day (QID) | OPHTHALMIC | Status: DC

## 2015-11-25 MED ORDER — PHENTERMINE HCL 37.5 MG PO CAPS: 37.5000 mg | ORAL_CAPSULE | ORAL | Status: DC

## 2015-11-25 NOTE — Progress Notes (Signed)
Pre visit review using our clinic review tool, if applicable. No additional management support is needed unless otherwise documented below in the visit note. 

## 2015-11-25 NOTE — Progress Notes (Signed)
Subjective:    Patient ID: Virginia Jimenez, female    DOB: Apr 21, 1961, 55 y.o.   MRN: [id number]  HPI    Here with 2-3 days acute onset fever, mild bilat eye pain, pressure, headache, general weakness and malaise, and watery d/c, with mild ST and cough, but pt denies chest pain, wheezing, increased sob or doe, orthopnea, PND, increased LE swelling, palpitations, dizziness or syncope.  Pt denies new neurological symptoms such as new headache, or facial or extremity weakness or numbness   Pt denies polydipsia, polyuria, or low sugar symptoms such as weakness or confusion improved with po intake.  Pt states overall good compliance with meds, trying to follow lower cholesterol, diabetic diet, wt overall stable.  Asks for phentermine refill, has tried mult diets and exercise without success.   Past Medical History  Diagnosis Date  . Diabetes mellitus   . Hypertension   . Depression    Past Surgical History  Procedure Laterality Date  . Tubal ligation      reports that she has never smoked. She has never used smokeless tobacco. She reports that she does not drink alcohol or use illicit drugs. family history includes Arthritis in her father; Cancer in her mother and paternal grandmother; Diabetes in her father; Heart disease in her father; Hypertension in her father and mother; Lung cancer in her mother. There is no history of Early death, Kidney disease, or Stroke. No Known Allergies Current Outpatient Prescriptions on File Prior to Visit  Medication Sig Dispense Refill  . amLODipine (NORVASC) 5 MG tablet TAKE 1 TABLET (5 MG TOTAL) BY MOUTH DAILY. 90 tablet 2  . dexlansoprazole (DEXILANT) 60 MG capsule Take 1 capsule (60 mg total) by mouth daily. 90 capsule 3  . irbesartan (AVAPRO) 150 MG tablet Take 1 tablet (150 mg total) by mouth daily. 90 tablet 3  . Multiple Vitamin (MULITIVITAMIN WITH MINERALS) TABS Take 1 tablet by mouth daily.    . Omega-3 Fatty Acids (OMEGA 3 PO) Take 2 capsules by mouth  daily.    . Vilazodone HCl 10 & 20 & 40 MG KIT Take 1 tablet by mouth daily. 1 kit 0   No current facility-administered medications on file prior to visit.   Review of Systems  Constitutional: Negative for unusual diaphoresis or night sweats HENT: Negative for ringing in ear or discharge Eyes: Negative for double vision or worsening visual disturbance.  Respiratory: Negative for choking and stridor.   Gastrointestinal: Negative for vomiting or other signifcant bowel change Genitourinary: Negative for hematuria or change in urine volume.  Musculoskeletal: Negative for other MSK pain or swelling Skin: Negative for color change and worsening wound.  Neurological: Negative for tremors and numbness other than noted  Psychiatric/Behavioral: Negative for decreased concentration or agitation other than above  _0    Objective:   Physical Exam BP 138/82 mmHg  Pulse 78  Temp(Src) 98.5 F (36.9 C) (Oral)  Resp 20  Wt 194 lb (87.998 kg)  SpO2 97%  LMP 06/01/2012 VS noted, mild ill Constitutional: Pt appears in no significant distress HENT: Head: NCAT.  Right Ear: External ear normal.  Left Ear: External ear normal.  Eyes: . Pupils are equal, round, and reactive to light. Conjunctivae with bilat eythema and d/c, and EOM are normal Bilat tm's with mild erythema.  Max sinus areas non tender.  Pharynx with mild erythema, no exudate Neck: Normal range of motion. Neck supple.  Cardiovascular: Normal rate and regular rhythm.   Pulmonary/Chest: Effort  normal and breath sounds without rales or wheezing.  Neurological: Pt is alert. Not confused , motor grossly intact Skin: Skin is warm. No rash, no LE edema Psychiatric: Pt behavior is normal. No agitation.     Assessment & Plan:   

## 2015-11-25 NOTE — Patient Instructions (Signed)
Please take all new medication as prescribed  - the antibiotic  Please continue all other medications as before, and refills have been done if requested - the phentermine  Please have the pharmacy call with any other refills you may need.  Please keep your appointments with your specialists as you may have planned

## 2015-11-26 NOTE — Assessment & Plan Note (Signed)
For one mo phenetermine, decreased calories, increased activity, f/u PCP for further refills

## 2015-11-26 NOTE — Assessment & Plan Note (Signed)
stable overall by history and exam, recent data reviewed with pt, and pt to continue medical treatment as before,  to f/u any worsening symptoms or concerns BP Readings from Last 3 Encounters:  11/25/15 138/82  04/28/15 130/88  04/14/14 130/70

## 2015-11-26 NOTE — Assessment & Plan Note (Signed)
stable overall by history and exam, recent data reviewed with pt, and pt to continue medical treatment as before,  to f/u any worsening symptoms or concerns Lab Results  Component Value Date   HGBA1C 6.4 04/28/2015   To f/u PCP, cont diet/wt loss efforts

## 2015-11-26 NOTE — Assessment & Plan Note (Signed)
Mild to mod, for antibx course,  to f/u any worsening symptoms or concerns 

## 2015-12-28 ENCOUNTER — Encounter: Payer: Self-pay | Admitting: Internal Medicine

## 2015-12-28 ENCOUNTER — Ambulatory Visit (INDEPENDENT_AMBULATORY_CARE_PROVIDER_SITE_OTHER): Payer: 59 | Admitting: Internal Medicine

## 2015-12-28 ENCOUNTER — Other Ambulatory Visit (INDEPENDENT_AMBULATORY_CARE_PROVIDER_SITE_OTHER): Payer: 59

## 2015-12-28 VITALS — BP 122/80 | HR 72 | Temp 98.6°F | Resp 16 | Ht 65.0 in | Wt 184.0 lb

## 2015-12-28 DIAGNOSIS — E785 Hyperlipidemia, unspecified: Secondary | ICD-10-CM | POA: Diagnosis not present

## 2015-12-28 DIAGNOSIS — E118 Type 2 diabetes mellitus with unspecified complications: Secondary | ICD-10-CM

## 2015-12-28 DIAGNOSIS — I10 Essential (primary) hypertension: Secondary | ICD-10-CM

## 2015-12-28 DIAGNOSIS — E669 Obesity, unspecified: Secondary | ICD-10-CM | POA: Diagnosis not present

## 2015-12-28 DIAGNOSIS — Z1231 Encounter for screening mammogram for malignant neoplasm of breast: Secondary | ICD-10-CM

## 2015-12-28 LAB — LIPID PANEL
Cholesterol: 215 mg/dL — ABNORMAL HIGH (ref 0–200)
HDL: 73.7 mg/dL (ref >39.00)
LDL CALC: 131 mg/dL — AB (ref 0–99)
NONHDL: 141.22
Total CHOL/HDL Ratio: 3
Triglycerides: 51 mg/dL (ref 0.0–149.0)
VLDL: 10.2 mg/dL (ref 0.0–40.0)

## 2015-12-28 LAB — BASIC METABOLIC PANEL
BUN: 13 mg/dL (ref 6–23)
CALCIUM: 9.9 mg/dL (ref 8.4–10.5)
CO2: 29 meq/L (ref 19–32)
CREATININE: 0.71 mg/dL (ref 0.40–1.20)
Chloride: 103 mEq/L (ref 96–112)
GFR: 110.22 mL/min (ref >60.00)
GLUCOSE: 116 mg/dL — AB (ref 70–99)
Potassium: 3.6 mEq/L (ref 3.5–5.1)
Sodium: 140 mEq/L (ref 135–145)

## 2015-12-28 LAB — HEMOGLOBIN A1C: Hgb A1c MFr Bld: 7.2 % — ABNORMAL HIGH (ref 4.6–6.5)

## 2015-12-28 MED ORDER — ATORVASTATIN CALCIUM 20 MG PO TABS: 20.0000 mg | ORAL_TABLET | Freq: Every day | ORAL | Status: DC

## 2015-12-28 MED ORDER — PHENTERMINE HCL 37.5 MG PO CAPS: 37.5000 mg | ORAL_CAPSULE | ORAL | Status: DC

## 2015-12-28 NOTE — Progress Notes (Signed)
Subjective:  Patient ID: Virginia Jimenez, female    DOB: Jan 28, 1961  Age: 55 y.o. MRN: [id number]  CC: Hypertension and Diabetes   HPI Virginia Jimenez presents for a blood pressure check and follow-up on diabetes. She feels well and only complains that she would like to lose more weight and asked me to write a prescription for phentermine to continue with her weight loss regimen. Her blood pressures been well controlled with the combination of amlodipine and irbesartan. She denies headache, blurred vision, chest pain, shortness of breath, or edema.  Outpatient Prescriptions Prior to Visit  Medication Sig Dispense Refill  . amLODipine (NORVASC) 5 MG tablet TAKE 1 TABLET (5 MG TOTAL) BY MOUTH DAILY. 90 tablet 2  . dexlansoprazole (DEXILANT) 60 MG capsule Take 1 capsule (60 mg total) by mouth daily. 90 capsule 3  . irbesartan (AVAPRO) 150 MG tablet Take 1 tablet (150 mg total) by mouth daily. 90 tablet 3  . Multiple Vitamin (MULITIVITAMIN WITH MINERALS) TABS Take 1 tablet by mouth daily.    . Omega-3 Fatty Acids (OMEGA 3 PO) Take 2 capsules by mouth daily.    . phentermine 37.5 MG capsule Take 1 capsule (37.5 mg total) by mouth every morning. 30 capsule 0  . tobramycin (TOBREX) 0.3 % ophthalmic solution Place 1 drop into both eyes 4 (four) times daily. 5 mL 0  . Vilazodone HCl 10 & 20 & 40 MG KIT Take 1 tablet by mouth daily. 1 kit 0   No facility-administered medications prior to visit.    ROS Review of Systems  Constitutional: Negative.  Negative for chills, fatigue and unexpected weight change.  HENT: Negative.  Negative for sinus pressure.   Eyes: Negative.  Negative for photophobia and visual disturbance.  Respiratory: Negative.  Negative for cough, choking, chest tightness, shortness of breath and stridor.   Cardiovascular: Negative.  Negative for chest pain, palpitations and leg swelling.  Gastrointestinal: Negative.  Negative for nausea, vomiting, abdominal pain, diarrhea,  constipation and blood in stool.  Endocrine: Negative.   Genitourinary: Negative.   Musculoskeletal: Negative.  Negative for back pain and neck pain.  Skin: Negative.   Allergic/Immunologic: Negative.   Neurological: Negative.  Negative for dizziness and light-headedness.  Hematological: Negative.  Negative for adenopathy. Does not bruise/bleed easily.  Psychiatric/Behavioral: Negative.  Negative for confusion and sleep disturbance. The patient is not nervous/anxious.     Objective:  BP 122/80 mmHg  Pulse 72  Temp(Src) 98.6 F (37 C) (Oral)  Resp 16  Ht 5' 5" (1.651 m)  Wt 184 lb (83.462 kg)  BMI 30.62 kg/m2  SpO2 98%  LMP 06/01/2012  BP Readings from Last 3 Encounters:  12/28/15 122/80  11/25/15 138/82  04/28/15 130/88    Wt Readings from Last 3 Encounters:  12/28/15 184 lb (83.462 kg)  11/25/15 194 lb (87.998 kg)  04/28/15 189 lb (85.73 kg)    Physical Exam  Constitutional: She is oriented to person, place, and time. No distress.  HENT:  Mouth/Throat: Oropharynx is clear and moist.  Eyes: Conjunctivae and EOM are normal. Right eye exhibits no discharge. Left eye exhibits no discharge. No scleral icterus.  Neck: Normal range of motion. Neck supple. No JVD present. No tracheal deviation present. No thyromegaly present.  Cardiovascular: Normal rate, regular rhythm, normal heart sounds and intact distal pulses.  Exam reveals no gallop and no friction rub.   No murmur heard. Pulmonary/Chest: Effort normal and breath sounds normal. No stridor. No respiratory distress. She  has no wheezes. She has no rales. She exhibits no tenderness.  Abdominal: Soft. Bowel sounds are normal. She exhibits no distension and no mass. There is no tenderness. There is no rebound and no guarding.  Musculoskeletal: Normal range of motion. She exhibits no edema or tenderness.  Lymphadenopathy:    She has no cervical adenopathy.  Neurological: She is oriented to person, place, and time.  Skin:  Skin is dry. No rash noted. She is not diaphoretic. No erythema. No pallor.  Vitals reviewed.   Lab Results  Component Value Date   WBC 2.6 Repeated and verified X2.* 04/28/2015   HGB 13.3 04/28/2015   HCT 39.4 04/28/2015   PLT 183.0 04/28/2015   GLUCOSE 116* 12/28/2015   CHOL 215* 12/28/2015   TRIG 51.0 12/28/2015   HDL 73.70 12/28/2015   LDLDIRECT 138.6 04/15/2013   LDLCALC 131* 12/28/2015   ALT 17 04/28/2015   AST 16 04/28/2015   NA 140 12/28/2015   K 3.6 12/28/2015   CL 103 12/28/2015   CREATININE 0.71 12/28/2015   BUN 13 12/28/2015   CO2 29 12/28/2015   TSH 1.14 04/28/2015   HGBA1C 7.2* 12/28/2015   MICROALBUR <0.7 04/28/2015    Mm Digital Screening  09/22/2013  CLINICAL DATA:  Screening. EXAM: DIGITAL SCREENING BILATERAL MAMMOGRAM WITH CAD COMPARISON:  02/27/2007 ACR Breast Density Category b: There are scattered areas of fibroglandular density. FINDINGS: There are no findings suspicious for malignancy. Images were processed with CAD. IMPRESSION: No mammographic evidence of malignancy. A result letter of this screening mammogram will be mailed directly to the patient. RECOMMENDATION: Screening mammogram in one year. (Code:SM-B-01Y) BI-RADS CATEGORY  1: Negative Electronically Signed   By: Ulyess Blossom M.D.   On: 09/22/2013 14:18    Assessment & Plan:   Virginia Jimenez was seen today for hypertension and diabetes.  Diagnoses and all orders for this visit:  Essential hypertension, benign- her blood pressures well controlled, electrolytes and renal function are stable. -     Basic metabolic panel; Future -     Urinalysis, Routine w reflex microscopic (not at [facility]); Future  Type 2 diabetes mellitus with complication, without long-term current use of insulin ([place])- her A1c is up to 7.2%, she does not need a medication at this time but will work on her lifestyle modifications with diet/exercise/weight loss. -     Basic metabolic panel; Future -     Hemoglobin A1c; Future -      Microalbumin / creatinine urine ratio; Future -     atorvastatin (LIPITOR) 20 MG tablet; Take 1 tablet (20 mg total) by mouth daily.  Hyperlipidemia with target LDL less than 100- she has not achieved her LDL goal so I've asked her to start a statin to reduce her risk of coronary artery disease and MI. -     Lipid panel; Future -     atorvastatin (LIPITOR) 20 MG tablet; Take 1 tablet (20 mg total) by mouth daily.  Obesity (BMI 30.0-34.9)- in addition to lifestyle modifications she will use phentermine to help her decrease her caloric intake. -     phentermine 37.5 MG capsule; Take 1 capsule (37.5 mg total) by mouth every morning.  Visit for screening mammogram -     MM DIGITAL SCREENING BILATERAL; Future   I have discontinued Ms. Fristoe's Vilazodone HCl and tobramycin. I am also having her start on atorvastatin. Additionally, I am having her maintain her multivitamin with minerals, Omega-3 Fatty Acids (OMEGA 3 PO), irbesartan, dexlansoprazole, amLODipine,  and phentermine.  Meds ordered this encounter  Medications  . phentermine 37.5 MG capsule    Sig: Take 1 capsule (37.5 mg total) by mouth every morning.    Dispense:  30 capsule    Refill:  3  . atorvastatin (LIPITOR) 20 MG tablet    Sig: Take 1 tablet (20 mg total) by mouth daily.    Dispense:  90 tablet    Refill:  3     Follow-up: Return in about 6 months (around 06/28/2016).  Scarlette Calico, MD

## 2015-12-28 NOTE — Progress Notes (Signed)
Pre visit review using our clinic review tool, if applicable. No additional management support is needed unless otherwise documented below in the visit note. 

## 2015-12-28 NOTE — Patient Instructions (Signed)
Hypertension Hypertension, commonly called high blood pressure, is when the force of blood pumping through your arteries is too strong. Your arteries are the blood vessels that carry blood from your heart throughout your body. A blood pressure reading consists of a higher number over a lower number, such as 110/72. The higher number (systolic) is the pressure inside your arteries when your heart pumps. The lower number (diastolic) is the pressure inside your arteries when your heart relaxes. Ideally you want your blood pressure below 120/80. Hypertension forces your heart to work harder to pump blood. Your arteries may become narrow or stiff. Having untreated or uncontrolled hypertension can cause heart attack, stroke, kidney disease, and other problems. RISK FACTORS Some risk factors for high blood pressure are controllable. Others are not.  Risk factors you cannot control include:   Race. You may be at higher risk if you are African American.  Age. Risk increases with age.  Gender. Men are at higher risk than women before age 45 years. After age 65, women are at higher risk than men. Risk factors you can control include:  Not getting enough exercise or physical activity.  Being overweight.  Getting too much fat, sugar, calories, or salt in your diet.  Drinking too much alcohol. SIGNS AND SYMPTOMS Hypertension does not usually cause signs or symptoms. Extremely high blood pressure (hypertensive crisis) may cause headache, anxiety, shortness of breath, and nosebleed. DIAGNOSIS To check if you have hypertension, your health care provider will measure your blood pressure while you are seated, with your arm held at the level of your heart. It should be measured at least twice using the same arm. Certain conditions can cause a difference in blood pressure between your right and left arms. A blood pressure reading that is higher than normal on one occasion does not mean that you need treatment. If  it is not clear whether you have high blood pressure, you may be asked to return on a different day to have your blood pressure checked again. Or, you may be asked to monitor your blood pressure at home for 1 or more weeks. TREATMENT Treating high blood pressure includes making lifestyle changes and possibly taking medicine. Living a healthy lifestyle can help lower high blood pressure. You may need to change some of your habits. Lifestyle changes may include:  Following the DASH diet. This diet is high in fruits, vegetables, and whole grains. It is low in salt, red meat, and added sugars.  Keep your sodium intake below 2,300 mg per day.  Getting at least 30-45 minutes of aerobic exercise at least 4 times per week.  Losing weight if necessary.  Not smoking.  Limiting alcoholic beverages.  Learning ways to reduce stress. Your health care provider may prescribe medicine if lifestyle changes are not enough to get your blood pressure under control, and if one of the following is true:  You are 18-59 years of age and your systolic blood pressure is above 140.  You are 60 years of age or older, and your systolic blood pressure is above 150.  Your diastolic blood pressure is above 90.  You have diabetes, and your systolic blood pressure is over 140 or your diastolic blood pressure is over 90.  You have kidney disease and your blood pressure is above 140/90.  You have heart disease and your blood pressure is above 140/90. Your personal target blood pressure may vary depending on your medical conditions, your age, and other factors. HOME CARE INSTRUCTIONS    Have your blood pressure rechecked as directed by your health care provider.   Take medicines only as directed by your health care provider. Follow the directions carefully. Blood pressure medicines must be taken as prescribed. The medicine does not work as well when you skip doses. Skipping doses also puts you at risk for  problems.  Do not smoke.   Monitor your blood pressure at home as directed by your health care provider. SEEK MEDICAL CARE IF:   You think you are having a reaction to medicines taken.  You have recurrent headaches or feel dizzy.  You have swelling in your ankles.  You have trouble with your vision. SEEK IMMEDIATE MEDICAL CARE IF:  You develop a severe headache or confusion.  You have unusual weakness, numbness, or feel faint.  You have severe chest or abdominal pain.  You vomit repeatedly.  You have trouble breathing. MAKE SURE YOU:   Understand these instructions.  Will watch your condition.  Will get help right away if you are not doing well or get worse.   This information is not intended to replace advice given to you by your health care provider. Make sure you discuss any questions you have with your health care provider.   Document Released: 09/03/2005 Document Revised: 01/18/2015 Document Reviewed: 06/26/2013 Elsevier Interactive Patient Education 2016 [employer]  

## 2016-02-01 ENCOUNTER — Ambulatory Visit
Admission: RE | Admit: 2016-02-01 | Discharge: 2016-02-01 | Disposition: A | Discharge status: Home / Self Care | Payer: 59 | Source: Ambulatory Visit | Attending: Internal Medicine | Admitting: Internal Medicine

## 2016-02-01 DIAGNOSIS — Z1231 Encounter for screening mammogram for malignant neoplasm of breast: Secondary | ICD-10-CM

## 2016-02-01 LAB — HM MAMMOGRAPHY

## 2016-02-01 NOTE — Addendum Note (Signed)
Addended by: Janith Lima on: 02/01/2016 04:45 PM   Modules accepted: Miquel Dunn

## 2016-05-09 ENCOUNTER — Other Ambulatory Visit: Payer: Self-pay | Admitting: Internal Medicine

## 2016-05-09 DIAGNOSIS — I10 Essential (primary) hypertension: Secondary | ICD-10-CM

## 2016-05-09 DIAGNOSIS — E118 Type 2 diabetes mellitus with unspecified complications: Secondary | ICD-10-CM

## 2016-08-21 ENCOUNTER — Other Ambulatory Visit: Payer: Self-pay | Admitting: Internal Medicine

## 2016-10-23 ENCOUNTER — Telehealth: Payer: Self-pay | Admitting: Internal Medicine

## 2016-10-23 DIAGNOSIS — E118 Type 2 diabetes mellitus with unspecified complications: Secondary | ICD-10-CM

## 2016-10-23 DIAGNOSIS — I10 Essential (primary) hypertension: Secondary | ICD-10-CM

## 2016-10-25 NOTE — Telephone Encounter (Signed)
Will you call pt and have her schedule an appt.

## 2016-10-26 NOTE — Telephone Encounter (Signed)
Tried to contact patient.  Home number does not work.  [employer] does not have VM.

## 2016-11-14 ENCOUNTER — Encounter: Payer: Self-pay | Admitting: Internal Medicine

## 2016-11-14 ENCOUNTER — Other Ambulatory Visit (INDEPENDENT_AMBULATORY_CARE_PROVIDER_SITE_OTHER): Payer: 59

## 2016-11-14 ENCOUNTER — Ambulatory Visit (INDEPENDENT_AMBULATORY_CARE_PROVIDER_SITE_OTHER): Payer: 59 | Admitting: Internal Medicine

## 2016-11-14 VITALS — BP 160/90 | HR 81 | Temp 98.2°F | Resp 16 | Ht 65.0 in | Wt 185.2 lb

## 2016-11-14 DIAGNOSIS — E118 Type 2 diabetes mellitus with unspecified complications: Secondary | ICD-10-CM

## 2016-11-14 DIAGNOSIS — M25561 Pain in right knee: Secondary | ICD-10-CM

## 2016-11-14 DIAGNOSIS — E785 Hyperlipidemia, unspecified: Secondary | ICD-10-CM | POA: Diagnosis not present

## 2016-11-14 DIAGNOSIS — M25562 Pain in left knee: Secondary | ICD-10-CM

## 2016-11-14 DIAGNOSIS — R072 Precordial pain: Secondary | ICD-10-CM | POA: Diagnosis not present

## 2016-11-14 DIAGNOSIS — R9431 Abnormal electrocardiogram [ECG] [EKG]: Secondary | ICD-10-CM

## 2016-11-14 DIAGNOSIS — I10 Essential (primary) hypertension: Secondary | ICD-10-CM

## 2016-11-14 DIAGNOSIS — B351 Tinea unguium: Secondary | ICD-10-CM | POA: Insufficient documentation

## 2016-11-14 DIAGNOSIS — K219 Gastro-esophageal reflux disease without esophagitis: Secondary | ICD-10-CM | POA: Diagnosis not present

## 2016-11-14 DIAGNOSIS — L602 Onychogryphosis: Secondary | ICD-10-CM | POA: Diagnosis not present

## 2016-11-14 DIAGNOSIS — G8929 Other chronic pain: Secondary | ICD-10-CM

## 2016-11-14 LAB — COMPREHENSIVE METABOLIC PANEL
ALBUMIN: 4.3 g/dL (ref 3.5–5.2)
ALK PHOS: 73 U/L (ref 39–117)
ALT: 16 U/L (ref 0–35)
AST: 18 U/L (ref 0–37)
BUN: 14 mg/dL (ref 6–23)
CO2: 29 mEq/L (ref 19–32)
Calcium: 9.7 mg/dL (ref 8.4–10.5)
Chloride: 102 mEq/L (ref 96–112)
Creatinine, Ser: 0.72 mg/dL (ref 0.40–1.20)
GFR: 108.1 mL/min (ref >60.00)
GLUCOSE: 176 mg/dL — AB (ref 70–99)
POTASSIUM: 4.4 meq/L (ref 3.5–5.1)
Sodium: 139 mEq/L (ref 135–145)
TOTAL PROTEIN: 8.2 g/dL (ref 6.0–8.3)
Total Bilirubin: 0.7 mg/dL (ref 0.2–1.2)

## 2016-11-14 LAB — URINALYSIS, ROUTINE W REFLEX MICROSCOPIC
BILIRUBIN URINE: NEGATIVE
Hgb urine dipstick: NEGATIVE
KETONES UR: NEGATIVE
LEUKOCYTES UA: NEGATIVE
Nitrite: NEGATIVE
PH: 7 (ref 5.0–8.0)
RBC / HPF: NONE SEEN (ref 0–?)
Specific Gravity, Urine: 1.01 (ref 1.000–1.030)
TOTAL PROTEIN, URINE-UPE24: NEGATIVE
UROBILINOGEN UA: 0.2 (ref 0.0–1.0)
Urine Glucose: NEGATIVE
WBC, UA: NONE SEEN (ref 0–?)

## 2016-11-14 LAB — CBC WITH DIFFERENTIAL/PLATELET
Basophils Absolute: 0 10*3/uL (ref 0.0–0.1)
Basophils Relative: 0.8 % (ref 0.0–3.0)
EOS PCT: 1 % (ref 0.0–5.0)
Eosinophils Absolute: 0 10*3/uL (ref 0.0–0.7)
HEMATOCRIT: 43.6 % (ref 36.0–46.0)
HEMOGLOBIN: 14.4 g/dL (ref 12.0–15.0)
Lymphocytes Relative: 33.4 % (ref 12.0–46.0)
Lymphs Abs: 0.9 10*3/uL (ref 0.7–4.0)
MCHC: 33.1 g/dL (ref 30.0–36.0)
MCV: 94.8 fl (ref 78.0–100.0)
MONOS PCT: 8.7 % (ref 3.0–12.0)
Monocytes Absolute: 0.2 10*3/uL (ref 0.1–1.0)
Neutro Abs: 1.6 10*3/uL (ref 1.4–7.7)
Neutrophils Relative %: 56.1 % (ref 43.0–77.0)
Platelets: 193 10*3/uL (ref 150.0–400.0)
RBC: 4.6 Mil/uL (ref 3.87–5.11)
RDW: 12.5 % (ref 11.5–15.5)
WBC: 2.8 10*3/uL — ABNORMAL LOW (ref 4.0–10.5)

## 2016-11-14 LAB — HEMOGLOBIN A1C: Hgb A1c MFr Bld: 7.4 % — ABNORMAL HIGH (ref 4.6–6.5)

## 2016-11-14 LAB — LIPID PANEL
Cholesterol: 241 mg/dL — ABNORMAL HIGH (ref 0–200)
HDL: 84.9 mg/dL (ref >39.00)
LDL CALC: 146 mg/dL — AB (ref 0–99)
NONHDL: 155.66
Total CHOL/HDL Ratio: 3
Triglycerides: 46 mg/dL (ref 0.0–149.0)
VLDL: 9.2 mg/dL (ref 0.0–40.0)

## 2016-11-14 LAB — MICROALBUMIN / CREATININE URINE RATIO
CREATININE, U: 90.8 mg/dL
Microalb Creat Ratio: 0.8 mg/g (ref 0.0–30.0)
Microalb, Ur: <0.7 mg/dL (ref 0.0–1.9)

## 2016-11-14 LAB — TSH: TSH: 0.63 u[IU]/mL (ref 0.35–4.50)

## 2016-11-14 MED ORDER — AZILSARTAN-CHLORTHALIDONE 40-12.5 MG PO TABS
1.0000 | ORAL_TABLET | Freq: Every day | ORAL | 0 refills | Status: DC
Start: 2016-11-14 — End: 2016-12-12

## 2016-11-14 MED ORDER — ATORVASTATIN CALCIUM 40 MG PO TABS: 40.0000 mg | ORAL_TABLET | Freq: Every day | ORAL | 3 refills | Status: DC

## 2016-11-14 MED ORDER — METFORMIN HCL ER 750 MG PO TB24: 1500.0000 mg | ORAL_TABLET | Freq: Every day | ORAL | 1 refills | Status: DC

## 2016-11-14 NOTE — Progress Notes (Signed)
Subjective:  Patient ID: Virginia Jimenez, female    DOB: 1960-09-29  Age: 56 y.o. MRN: [id number]  CC: Hypertension; Hyperlipidemia; Diabetes; and Chest Pain   HPI [name] presents for follow-up. She tells me her blood pressure has not been well controlled. She has not been taking any antihypertensives. She complains of tightness in her chest over the last 2 months that occurs with activity and at rest. She denies DOE, SOB, palpitations, edema, fatigue. She walks 20,000 steps per day. She complains of chronic fatigue.  Outpatient Medications Prior to Visit  Medication Sig Dispense Refill  . dexlansoprazole (DEXILANT) 60 MG capsule Take 1 capsule (60 mg total) by mouth daily. 90 capsule 3  . Omega-3 Fatty Acids (OMEGA 3 PO) Take 2 capsules by mouth daily.    Marland Kitchen amLODipine (NORVASC) 5 MG tablet TAKE 1 TABLET BY MOUTH DAILY 30 tablet 0  . atorvastatin (LIPITOR) 20 MG tablet Take 1 tablet (20 mg total) by mouth daily. 90 tablet 3  . irbesartan (AVAPRO) 150 MG tablet TAKE 1 TABLET BY MOUTH DAILY 30 tablet 1  . Multiple Vitamin (MULITIVITAMIN WITH MINERALS) TABS Take 1 tablet by mouth daily.    . phentermine 37.5 MG capsule Take 1 capsule (37.5 mg total) by mouth every morning. 30 capsule 3   No facility-administered medications prior to visit.     ROS Review of Systems  Constitutional: Positive for fatigue. Negative for activity change, appetite change, diaphoresis and unexpected weight change.  HENT: Negative.  Negative for trouble swallowing.   Eyes: Negative for photophobia and visual disturbance.  Respiratory: Negative for cough, chest tightness, shortness of breath, wheezing and stridor.   Cardiovascular: Positive for chest pain. Negative for palpitations and leg swelling.  Gastrointestinal: Negative for abdominal pain, constipation, diarrhea, nausea and vomiting.  Endocrine: Negative.   Genitourinary: Negative.  Negative for decreased urine volume, difficulty urinating,  dysuria, frequency and urgency.  Musculoskeletal: Positive for arthralgias (bilat knee pain). Negative for back pain, gait problem, myalgias and neck pain.  Skin: Negative.   Allergic/Immunologic: Negative.   Neurological: Negative.  Negative for dizziness, weakness, light-headedness and headaches.  Hematological: Negative.  Negative for adenopathy. Does not bruise/bleed easily.  Psychiatric/Behavioral: Negative.     Objective:  BP (!) 160/90 (BP Location: Left Arm, Patient Position: Sitting, Cuff Size: Normal)   Pulse 81   Temp 98.2 F (36.8 C) (Oral)   Resp 16   Ht 5\' 5"  (1.651 m)   Wt 185 lb 3 oz (84 kg)   LMP 06/01/2012   SpO2 97%   BMI 30.82 kg/m   BP Readings from Last 3 Encounters:  11/14/16 (!) 160/90  12/28/15 122/80  11/25/15 138/82    Wt Readings from Last 3 Encounters:  11/14/16 185 lb 3 oz (84 kg)  12/28/15 184 lb (83.5 kg)  11/25/15 194 lb (88 kg)    Physical Exam  Constitutional: She is oriented to person, place, and time. No distress.  HENT:  Mouth/Throat: Oropharynx is clear and moist. No oropharyngeal exudate.  Eyes: Conjunctivae are normal. Right eye exhibits no discharge. Left eye exhibits no discharge. No scleral icterus.  Neck: Normal range of motion. Neck supple. No JVD present. No tracheal deviation present. No thyromegaly present.  Cardiovascular: Normal rate, regular rhythm, normal heart sounds and intact distal pulses.  Exam reveals no gallop and no friction rub.   No murmur heard. EKG ---  Sinus  Rhythm  WITHIN NORMAL LIMITS  Pulmonary/Chest: Effort normal and breath  sounds normal. No stridor. No respiratory distress. She has no wheezes. She has no rales. She exhibits no tenderness.  Abdominal: Soft. Bowel sounds are normal. She exhibits no distension and no mass. There is no tenderness. There is no rebound and no guarding.  Musculoskeletal: Normal range of motion. She exhibits no edema, tenderness or deformity.  Lymphadenopathy:    She  has no cervical adenopathy.  Neurological: She is oriented to person, place, and time.  Skin: Skin is warm and dry. No rash noted. She is not diaphoretic. No erythema. No pallor.  Vitals reviewed.   Lab Results  Component Value Date   WBC 2.8 (L) 11/14/2016   HGB 14.4 11/14/2016   HCT 43.6 11/14/2016   PLT 193.0 11/14/2016   GLUCOSE 176 (H) 11/14/2016   CHOL 241 (H) 11/14/2016   TRIG 46.0 11/14/2016   HDL 84.90 11/14/2016   LDLDIRECT 138.6 04/15/2013   LDLCALC 146 (H) 11/14/2016   ALT 16 11/14/2016   AST 18 11/14/2016   NA 139 11/14/2016   K 4.4 11/14/2016   CL 102 11/14/2016   CREATININE 0.72 11/14/2016   BUN 14 11/14/2016   CO2 29 11/14/2016   TSH 0.63 11/14/2016   HGBA1C 7.4 (H) 11/14/2016   MICROALBUR <0.7 11/14/2016    Mm Digital Screening Bilateral  Result Date: 02/01/2016 CLINICAL DATA:  Screening. EXAM: DIGITAL SCREENING BILATERAL MAMMOGRAM WITH CAD COMPARISON:  Previous exam(s). ACR Breast Density Category b: There are scattered areas of fibroglandular density. FINDINGS: There are no findings suspicious for malignancy. Images were processed with CAD. IMPRESSION: No mammographic evidence of malignancy. A result letter of this screening mammogram will be mailed directly to the patient. RECOMMENDATION: Screening mammogram in one year. (Code:SM-B-01Y) BI-RADS CATEGORY  1: Negative. Electronically Signed   By: Lajean Manes M.D.   On: 02/01/2016 16:41    Assessment & Plan:   Cortny was seen today for hypertension, hyperlipidemia, diabetes and chest pain.  Diagnoses and all orders for this visit:  Essential hypertension, benign- her blood pressure is not well controlled, her labs are negative for any end organ damage or secondary causes of hypertension. Will try to control the blood pressure with an ARB and thiazide diuretic. -     Comprehensive metabolic panel; Future -     CBC with Differential/Platelet; Future -     Urinalysis, Routine w reflex microscopic;  Future -     EKG 12-Lead -     Azilsartan-Chlorthalidone (EDARBYCLOR) 40-12.5 MG TABS; Take 1 tablet by mouth daily.  Gastroesophageal reflux disease without esophagitis- this may be the cause for her chest pain but she doesn't have any other worrisome upper GI symptoms, we'll continue the PPI. -     CBC with Differential/Platelet; Future  Type 2 diabetes mellitus with complication, without long-term current use of insulin ([place])- her A1c is up to 7.4%, I've asked her to start taking metformin. Will also start an ARB for renal protection. -     Comprehensive metabolic panel; Future -     Hemoglobin A1c; Future -     Microalbumin / creatinine urine ratio; Future -     Ambulatory referral to Ophthalmology -     Ambulatory referral to Podiatry -     Azilsartan-Chlorthalidone (EDARBYCLOR) 40-12.5 MG TABS; Take 1 tablet by mouth daily. -     metFORMIN (GLUCOPHAGE-XR) 750 MG 24 hr tablet; Take 2 tablets (1,500 mg total) by mouth daily with breakfast.  Hyperlipidemia with target LDL less than 100- she has  not achieved her LDL goal so I've asked her to restart a statin -     Lipid panel; Future -     TSH; Future -     atorvastatin (LIPITOR) 40 MG tablet; Take 1 tablet (40 mg total) by mouth daily.  Precordial chest pain- her chest pain is not typical for angina and her EKG is normal, will treat the other causes such as GERD and musculoskeletal symptoms. -     EKG 12-Lead  Overgrown toenails -     Ambulatory referral to Podiatry  Chronic pain of both knees -     Ambulatory referral to Sports Medicine   I have discontinued Ms. Gertz's multivitamin with minerals, phentermine, atorvastatin, irbesartan, and amLODipine. I am also having her start on Azilsartan-Chlorthalidone, atorvastatin, and metFORMIN. Additionally, I am having her maintain her Omega-3 Fatty Acids (OMEGA 3 PO), dexlansoprazole, and ibuprofen.  Meds ordered this encounter  Medications  . ibuprofen (ADVIL,MOTRIN) 200 MG tablet     Sig: Take 400 mg by mouth daily.  . Azilsartan-Chlorthalidone (EDARBYCLOR) 40-12.5 MG TABS    Sig: Take 1 tablet by mouth daily.    Dispense:  28 tablet    Refill:  0  . atorvastatin (LIPITOR) 40 MG tablet    Sig: Take 1 tablet (40 mg total) by mouth daily.    Dispense:  90 tablet    Refill:  3  . metFORMIN (GLUCOPHAGE-XR) 750 MG 24 hr tablet    Sig: Take 2 tablets (1,500 mg total) by mouth daily with breakfast.    Dispense:  180 tablet    Refill:  1     Follow-up: Return in about 3 weeks (around 12/05/2016).  Scarlette Calico, MD

## 2016-11-14 NOTE — Progress Notes (Signed)
Pre visit review using our clinic review tool, if applicable. No additional management support is needed unless otherwise documented below in the visit note. 

## 2016-11-14 NOTE — Patient Instructions (Signed)

## 2016-12-12 ENCOUNTER — Other Ambulatory Visit (INDEPENDENT_AMBULATORY_CARE_PROVIDER_SITE_OTHER): Payer: 59

## 2016-12-12 ENCOUNTER — Ambulatory Visit (INDEPENDENT_AMBULATORY_CARE_PROVIDER_SITE_OTHER): Payer: 59 | Admitting: Internal Medicine

## 2016-12-12 ENCOUNTER — Encounter: Payer: Self-pay | Admitting: Internal Medicine

## 2016-12-12 VITALS — BP 120/78 | HR 73 | Temp 97.9°F | Resp 16 | Ht 65.0 in | Wt 194.2 lb

## 2016-12-12 DIAGNOSIS — R9431 Abnormal electrocardiogram [ECG] [EKG]: Secondary | ICD-10-CM | POA: Diagnosis not present

## 2016-12-12 DIAGNOSIS — M25561 Pain in right knee: Secondary | ICD-10-CM

## 2016-12-12 DIAGNOSIS — E785 Hyperlipidemia, unspecified: Secondary | ICD-10-CM

## 2016-12-12 DIAGNOSIS — G8929 Other chronic pain: Secondary | ICD-10-CM

## 2016-12-12 DIAGNOSIS — Z1211 Encounter for screening for malignant neoplasm of colon: Secondary | ICD-10-CM

## 2016-12-12 DIAGNOSIS — R072 Precordial pain: Secondary | ICD-10-CM

## 2016-12-12 DIAGNOSIS — M25562 Pain in left knee: Secondary | ICD-10-CM

## 2016-12-12 DIAGNOSIS — I10 Essential (primary) hypertension: Secondary | ICD-10-CM

## 2016-12-12 DIAGNOSIS — E118 Type 2 diabetes mellitus with unspecified complications: Secondary | ICD-10-CM

## 2016-12-12 LAB — CARDIAC PANEL
CK TOTAL: 131 U/L (ref 7–177)
CK-MB: 5.1 ng/mL — ABNORMAL HIGH (ref 0.3–4.0)
Relative Index: 3.9 calc — ABNORMAL HIGH (ref 0.0–2.5)

## 2016-12-12 LAB — BASIC METABOLIC PANEL
BUN: 26 mg/dL — ABNORMAL HIGH (ref 6–23)
CHLORIDE: 101 meq/L (ref 96–112)
CO2: 30 mEq/L (ref 19–32)
CREATININE: 0.8 mg/dL (ref 0.40–1.20)
Calcium: 9.5 mg/dL (ref 8.4–10.5)
GFR: 95.7 mL/min (ref >60.00)
GLUCOSE: 156 mg/dL — AB (ref 70–99)
POTASSIUM: 4.1 meq/L (ref 3.5–5.1)
Sodium: 138 mEq/L (ref 135–145)

## 2016-12-12 LAB — TROPONIN I: TNIDX: 0 ug/L (ref 0.00–0.06)

## 2016-12-12 MED ORDER — AZILSARTAN-CHLORTHALIDONE 40-12.5 MG PO TABS: 1.0000 | ORAL_TABLET | Freq: Every day | ORAL | 3 refills | Status: DC

## 2016-12-12 MED ORDER — ASPIRIN EC 81 MG PO TBEC: 81.0000 mg | DELAYED_RELEASE_TABLET | Freq: Every day | ORAL | 3 refills | Status: DC

## 2016-12-12 NOTE — Progress Notes (Signed)
Subjective:  Patient ID: Virginia Jimenez, female    DOB: [birth year]  Age: 56 y.o. MRN: [id number]  CC: Hypertension   HPI [name] presents for a BP check. She continues to have episodes of chest discomfort at rest. She describes it as a stinging and pressure sensation with no diaphoresis, shortness of breath, nausea, vomiting, weakness, fatigue, or syncope. She does not have the discomfort with exertion. She tells me her blood pressure has been well controlled on the ARB and thiazide diuretic.  Outpatient Medications Prior to Visit  Medication Sig Dispense Refill  . atorvastatin (LIPITOR) 40 MG tablet Take 1 tablet (40 mg total) by mouth daily. 90 tablet 3  . dexlansoprazole (DEXILANT) 60 MG capsule Take 1 capsule (60 mg total) by mouth daily. 90 capsule 3  . metFORMIN (GLUCOPHAGE-XR) 750 MG 24 hr tablet Take 2 tablets (1,500 mg total) by mouth daily with breakfast. 180 tablet 1  . Omega-3 Fatty Acids (OMEGA 3 PO) Take 2 capsules by mouth daily.    . Azilsartan-Chlorthalidone (EDARBYCLOR) 40-12.5 MG TABS Take 1 tablet by mouth daily. 28 tablet 0  . ibuprofen (ADVIL,MOTRIN) 200 MG tablet Take 400 mg by mouth daily.     No facility-administered medications prior to visit.     ROS Review of Systems  Constitutional: Negative.  Negative for appetite change, diaphoresis, fatigue and unexpected weight change.  HENT: Negative.   Eyes: Negative for visual disturbance.  Respiratory: Negative for choking, chest tightness, shortness of breath, wheezing and stridor.   Cardiovascular: Positive for chest pain. Negative for palpitations and leg swelling.  Gastrointestinal: Negative for abdominal pain, constipation, diarrhea, nausea and vomiting.  Endocrine: Negative.   Genitourinary: Negative.  Negative for decreased urine volume, difficulty urinating, dysuria and urgency.  Musculoskeletal: Negative for back pain, myalgias and neck pain.  Skin: Negative.   Allergic/Immunologic: Negative.     Neurological: Negative.  Negative for dizziness, tremors, weakness, light-headedness, numbness and headaches.  Hematological: Negative for adenopathy. Does not bruise/bleed easily.  Psychiatric/Behavioral: Negative.     Objective:  BP 120/78 (BP Location: Left Arm, Patient Position: Sitting, Cuff Size: Normal)   Pulse 73   Temp 97.9 F (36.6 C) (Oral)   Resp 16   Ht 5\' 5"  (1.651 m)   Wt 194 lb 4 oz (88.1 kg)   LMP 06/01/2012   SpO2 97%   BMI 32.32 kg/m   BP Readings from Last 3 Encounters:  12/12/16 120/78  11/14/16 (!) 160/90  12/28/15 122/80    Wt Readings from Last 3 Encounters:  12/12/16 194 lb 4 oz (88.1 kg)  11/14/16 185 lb 3 oz (84 kg)  12/28/15 184 lb (83.5 kg)    Physical Exam  Constitutional: She is oriented to person, place, and time. No distress.  HENT:  Mouth/Throat: Oropharynx is clear and moist. No oropharyngeal exudate.  Eyes: Conjunctivae are normal. Right eye exhibits no discharge. Left eye exhibits no discharge. No scleral icterus.  Neck: Normal range of motion. Neck supple. No JVD present. No tracheal deviation present. No thyromegaly present.  Cardiovascular: Normal rate, regular rhythm, normal heart sounds and intact distal pulses.  Exam reveals no gallop and no friction rub.   No murmur heard. EKG ---  Sinus  Rhythm  -  Nonspecific T-abnormality.   ABNORMAL   Pulmonary/Chest: Effort normal and breath sounds normal. No stridor. No respiratory distress. She has no wheezes. She has no rales. She exhibits no tenderness.  Abdominal: Soft. Bowel sounds are normal. She  exhibits no distension and no mass. There is no tenderness. There is no rebound and no guarding.  Musculoskeletal: Normal range of motion. She exhibits no edema, tenderness or deformity.  Lymphadenopathy:    She has no cervical adenopathy.  Neurological: She is oriented to person, place, and time.  Skin: Skin is warm and dry. No rash noted. She is not diaphoretic. No erythema. No  pallor.  Vitals reviewed.   Lab Results  Component Value Date   WBC 2.8 (L) 11/14/2016   HGB 14.4 11/14/2016   HCT 43.6 11/14/2016   PLT 193.0 11/14/2016   GLUCOSE 156 (H) 12/12/2016   CHOL 241 (H) 11/14/2016   TRIG 46.0 11/14/2016   HDL 84.90 11/14/2016   LDLDIRECT 138.6 04/15/2013   LDLCALC 146 (H) 11/14/2016   ALT 16 11/14/2016   AST 18 11/14/2016   NA 138 12/12/2016   K 4.1 12/12/2016   CL 101 12/12/2016   CREATININE 0.80 12/12/2016   BUN 26 (H) 12/12/2016   CO2 30 12/12/2016   TSH 0.63 11/14/2016   HGBA1C 7.4 (H) 11/14/2016   MICROALBUR <0.7 11/14/2016    Mm Digital Screening Bilateral  Result Date: 02/01/2016 CLINICAL DATA:  Screening. EXAM: DIGITAL SCREENING BILATERAL MAMMOGRAM WITH CAD COMPARISON:  Previous exam(s). ACR Breast Density Category b: There are scattered areas of fibroglandular density. FINDINGS: There are no findings suspicious for malignancy. Images were processed with CAD. IMPRESSION: No mammographic evidence of malignancy. A result letter of this screening mammogram will be mailed directly to the patient. RECOMMENDATION: Screening mammogram in one year. (Code:SM-B-01Y) BI-RADS CATEGORY  1: Negative. Electronically Signed   By: Lajean Manes M.D.   On: 02/01/2016 16:41    Assessment & Plan:   Joane was seen today for hypertension.  Diagnoses and all orders for this visit:  Screening for malignant neoplasm of colon -     Ambulatory referral to Gastroenterology  Precordial chest pain- her chest pain is not typical for ischemia, her troponin is negative and her CK-MB is very slightly, but probably insignificantly, elevated. I've asked her to start taking a baby aspirin a day. I have asked her to undergo a Lexiscan to screen for ischemia. She will report to the ED immediately for any new or worsening symptoms. -     Cancel: EXERCISE TOLERANCE TEST; Future -     EKG 12-Lead -     Myocardial Perfusion Imaging; Future -     Troponin I; Future -      Cardiac panel; Future -     aspirin EC 81 MG tablet; Take 1 tablet (81 mg total) by mouth daily.  Essential hypertension, benign- her blood pressure is adequately well-controlled, electrolytes and renal function are normal. -     Basic metabolic panel; Future -     Azilsartan-Chlorthalidone (EDARBYCLOR) 40-12.5 MG TABS; Take 1 tablet by mouth daily.  Abnormal EKG- she has nonspecific ST-T wave changes that are new, I've asked her to undergo a Lexiscan to screen for ischemia. -     Myocardial Perfusion Imaging; Future -     Troponin I; Future -     Cardiac panel; Future  Hyperlipidemia with target LDL less than 100 -     aspirin EC 81 MG tablet; Take 1 tablet (81 mg total) by mouth daily.  Type 2 diabetes mellitus with complication, without long-term current use of insulin ([place])- recent A1c was 7.4% indicating adequate blood sugar control. Will continue the ARB for renal protection. -  Azilsartan-Chlorthalidone (EDARBYCLOR) 40-12.5 MG TABS; Take 1 tablet by mouth daily.   I have discontinued Ms. Pennel's ibuprofen. I am also having her start on aspirin EC. Additionally, I am having her maintain her Omega-3 Fatty Acids (OMEGA 3 PO), dexlansoprazole, atorvastatin, metFORMIN, and Azilsartan-Chlorthalidone.  Meds ordered this encounter  Medications  . aspirin EC 81 MG tablet    Sig: Take 1 tablet (81 mg total) by mouth daily.    Dispense:  90 tablet    Refill:  3  . Azilsartan-Chlorthalidone (EDARBYCLOR) 40-12.5 MG TABS    Sig: Take 1 tablet by mouth daily.    Dispense:  30 tablet    Refill:  3     Follow-up: Return in about 6 weeks (around 01/23/2017).  Scarlette Calico, MD

## 2016-12-12 NOTE — Patient Instructions (Signed)

## 2016-12-12 NOTE — Progress Notes (Signed)
Pre visit review using our clinic review tool, if applicable. No additional management support is needed unless otherwise documented below in the visit note. 

## 2016-12-17 ENCOUNTER — Telehealth: Payer: Self-pay | Admitting: Internal Medicine

## 2016-12-17 NOTE — Telephone Encounter (Addendum)
Patient states she was prescribed a new BP medication.  Does not know the name of medication.  States her copay is still an 80 copay.  Is requesting something in place of this.   Patient states that Dr. Ronnald Ramp was going to set her up with a stress test that was not performed on a treadmill.  Would like Dr. Ronnald Ramp to enter referral in regard.  Patient states she has been tired and fingers have been numb for a few months and would like this done as soon as possible.

## 2016-12-18 ENCOUNTER — Other Ambulatory Visit: Payer: Self-pay | Admitting: Internal Medicine

## 2016-12-18 DIAGNOSIS — I10 Essential (primary) hypertension: Secondary | ICD-10-CM

## 2016-12-18 DIAGNOSIS — E118 Type 2 diabetes mellitus with unspecified complications: Secondary | ICD-10-CM

## 2016-12-18 MED ORDER — TELMISARTAN 40 MG PO TABS: 40.0000 mg | ORAL_TABLET | Freq: Every day | ORAL | 1 refills | Status: DC

## 2016-12-18 MED ORDER — CHLORTHALIDONE 25 MG PO TABS: 12.5000 mg | ORAL_TABLET | Freq: Every day | ORAL | 1 refills | Status: DC

## 2016-12-18 NOTE — Telephone Encounter (Signed)
Called pharmacy and they stated that the [facility] is rx that has an 77 dollar copay.   Is there an alternate rx? Please advise

## 2016-12-18 NOTE — Telephone Encounter (Signed)
Tried to call pt to inform of change. Number is not accepting calls at this time. Closing note.

## 2016-12-18 NOTE — Telephone Encounter (Signed)
changed

## 2016-12-19 ENCOUNTER — Ambulatory Visit (INDEPENDENT_AMBULATORY_CARE_PROVIDER_SITE_OTHER): Payer: 59 | Admitting: Podiatry

## 2016-12-19 ENCOUNTER — Encounter: Payer: Self-pay | Admitting: Podiatry

## 2016-12-19 ENCOUNTER — Ambulatory Visit (INDEPENDENT_AMBULATORY_CARE_PROVIDER_SITE_OTHER): Payer: 59

## 2016-12-19 VITALS — BP 130/85 | HR 71 | Resp 16 | Ht 65.0 in | Wt 185.0 lb

## 2016-12-19 DIAGNOSIS — L608 Other nail disorders: Secondary | ICD-10-CM

## 2016-12-19 DIAGNOSIS — M79671 Pain in right foot: Secondary | ICD-10-CM

## 2016-12-19 DIAGNOSIS — M79609 Pain in unspecified limb: Secondary | ICD-10-CM | POA: Diagnosis not present

## 2016-12-19 DIAGNOSIS — M79673 Pain in unspecified foot: Secondary | ICD-10-CM

## 2016-12-19 DIAGNOSIS — M722 Plantar fascial fibromatosis: Secondary | ICD-10-CM | POA: Diagnosis not present

## 2016-12-19 DIAGNOSIS — L603 Nail dystrophy: Secondary | ICD-10-CM

## 2016-12-19 DIAGNOSIS — B351 Tinea unguium: Secondary | ICD-10-CM | POA: Diagnosis not present

## 2016-12-19 DIAGNOSIS — M79672 Pain in left foot: Secondary | ICD-10-CM

## 2016-12-19 NOTE — Progress Notes (Signed)
   Subjective:    Patient ID: Virginia Jimenez, female    DOB: 04-07-1961, 56 y.o.   MRN: [id number]  HPI    Review of Systems  Neurological: Positive for headaches.  All other systems reviewed and are negative.      Objective:   Physical Exam        Assessment & Plan:

## 2016-12-22 NOTE — Progress Notes (Signed)
   Subjective: Patient is a 56 year old female presenting as a new patient for evaluation of bilateral plantar heel pain that has been ongoing for approximately one year. She states her pain is worse at the end of the day with right being worse than her left. She also complains of nail discoloration and thickness to bilateral great toes that has been ongoing for approximately 6 months. 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 She has been taking Advil daily which is helping to control her pain. She denies any trauma.    Objective/Physical Exam General: The patient is alert and oriented x3 in no acute distress.  Dermatology: Skin is warm, dry and supple bilateral lower extremities. Hyperkeratotic, discolored, thickened, onychodystrophy of great toenails noted bilaterally.  Skin is warm, dry and supple bilateral lower extremities. Negative for open lesions or macerations.  Vascular: Palpable pedal pulses bilaterally. No edema or erythema noted. Capillary refill within normal limits.  Neurological: Epicritic and protective threshold grossly intact bilaterally.   Musculoskeletal Exam: Tenderness to palpation at the medial calcaneal tubercale and through the insertion of the plantar fascia of the bilateral feet. All other joints range of motion within normal limits bilateral. Strength 5/5 in all groups bilateral.    Assessment: #1 plantar fasciitis bilateral feet #2 pain in bilateral feet #3 Onychomycosis of bilateral great toes   Plan of Care:  #1 Patient was evaluated. #2 Injection of 0.5cc Celestone soluspan injected into the bilateral heels.  3. Instructed patient regarding therapies and modalities at home to alleviate symptoms.  4. Rx for Meloxicam 15mg  PO given to patient. 5. Plantar fascial band(s) dispensed for bilateral plantar fasciitis. 7. Return to clinic in 4 weeks.  Edrick Kins, DPM [facility]  Dr. Edrick Kins,  [address]                                        [place], [place] [address]                Office [phone]  Fax [phone]

## 2016-12-25 ENCOUNTER — Encounter: Payer: Self-pay | Admitting: Gastroenterology

## 2016-12-25 NOTE — Progress Notes (Signed)
[facility] [address] [place], [place] [address] Phone: [phone] Subjective:    I'm seeing this patient by the request  of:  Scarlette Calico, MD   CC: Knee pain  YFV:[id number]  Virginia Jimenez is a 56 y.o. female coming in with complaint of knee pain. Seems to be bilateral. Right greater than left. Been going on multiple months. Patient states it seems to be worsening. Daily activities it becomes significantly more difficult. Patient denies any true swelling. States though that she does feel like she has loss some range of motion. No weakness but feels like her legs do fatigue. Denies any associated back pain. Seems that the pain is more diffusely over the knees and once again right greater than left. Rates the severity of pain is 10 out of 10.     Past Medical History:  Diagnosis Date  . Depression   . Diabetes mellitus   . Hypertension    Past Surgical History:  Procedure Laterality Date  . TUBAL LIGATION     Social History   Social History  . Marital status: Divorced    Spouse name: N/A  . Number of children: N/A  . Years of education: N/A   Social History Main Topics  . Smoking status: Never Smoker  . Smokeless tobacco: Never Used  . Alcohol use No     Comment: social  . Drug use: No  . Sexual activity: Yes    Partners: Male    Birth control/ protection: Surgical   Other Topics Concern  . None   Social History Narrative  . None   No Known Allergies Family History  Problem Relation Age of Onset  . Lung cancer Mother   . Hypertension Mother   . Cancer Mother     lung  . Arthritis Father   . Heart disease Father   . Hypertension Father   . Diabetes Father   . Cancer Paternal Grandmother     colon  . Early death Neg Hx   . Kidney disease Neg Hx   . Stroke Neg Hx     Past medical history, social, surgical and family history all reviewed in electronic medical record.  No pertanent information unless stated regarding  to the chief complaint.   Review of Systems:Review of systems updated and as accurate as of 12/26/16  No headache, visual changes, nausea, vomiting, diarrhea, constipation, dizziness, abdominal pain, skin rash, fevers, chills, night sweats, weight loss, swollen lymph nodes,chest pain, shortness of breath, mood changes. Positive muscle aches and body aches  Objective  Last menstrual period 06/01/2012. Systems examined below as of 12/26/16   General: No apparent distress alert and oriented x3 mood and affect normal, dressed appropriately.  HEENT: Pupils equal, extraocular movements intact  Respiratory: Patient's speak in full sentences and does not appear short of breath  Cardiovascular: No lower extremity edema, non tender, no erythema  Skin: Warm dry intact with no signs of infection or rash on extremities or on axial skeleton.  Abdomen: Soft nontender  Neuro: Cranial nerves II through XII are intact, neurovascularly intact in all extremities with 2+ DTRs and 2+ pulses.  Lymph: No lymphadenopathy of posterior or anterior cervical chain or axillae bilaterally.  Gait normal with good balance and coordination.  MSK:  Non tender with full range of motion and good stability and symmetric strength and tone of shoulders, elbows, wrist, hip and ankles bilaterally.  Knee: Bilateral valgus deformity noted. Large thigh to calf  ratio.  Tender to palpation over medial and PF joint line.  ROM full in flexion and extension and lower leg rotation. instability with valgus force.  painful patellar compression. Patellar glide with moderate crepitus. Patellar and quadriceps tendons unremarkable. Hamstring and quadriceps strength is normal.  MSK US performed of: Bilateral This study was ordered, performed, and interpreted by Charlann Boxer D.O.  Knee: Patient does have severe narrowing of the patellofemoral and medial joint bilaterally. Degenerative changes of the meniscus. .  IMPRESSION:  Severe  osteophytic changes of the knees.  After informed written and verbal consent, patient was seated on exam table. Right knee was prepped with alcohol swab and utilizing anterolateral approach, patient's right knee space was injected with 4:1  marcaine 0.5%: Kenalog 40mg /dL. Patient tolerated the procedure well without immediate complications.  After informed written and verbal consent, patient was seated on exam table. Left knee was prepped with alcohol swab and utilizing anterolateral approach, patient's left knee space was injected with 4:1  marcaine 0.5%: Kenalog 40mg /dL. Patient tolerated the procedure well without immediate complications.    Impression and Recommendations:     This case required medical decision making of moderate complexity.      Note: This dictation was prepared with Dragon dictation along with smaller phrase technology. Any transcriptional errors that result from this process are unintentional.

## 2016-12-25 NOTE — Telephone Encounter (Signed)
telmisartan is also not covered.   Starting PA

## 2016-12-26 ENCOUNTER — Ambulatory Visit (INDEPENDENT_AMBULATORY_CARE_PROVIDER_SITE_OTHER): Payer: 59 | Admitting: Family Medicine

## 2016-12-26 ENCOUNTER — Other Ambulatory Visit (INDEPENDENT_AMBULATORY_CARE_PROVIDER_SITE_OTHER): Payer: 59

## 2016-12-26 ENCOUNTER — Ambulatory Visit: Payer: Self-pay

## 2016-12-26 ENCOUNTER — Encounter: Payer: Self-pay | Admitting: Family Medicine

## 2016-12-26 ENCOUNTER — Ambulatory Visit (INDEPENDENT_AMBULATORY_CARE_PROVIDER_SITE_OTHER)
Admission: RE | Admit: 2016-12-26 | Discharge: 2016-12-26 | Disposition: A | Discharge status: Home / Self Care | Payer: 59 | Source: Ambulatory Visit | Attending: Family Medicine | Admitting: Family Medicine

## 2016-12-26 DIAGNOSIS — M170 Bilateral primary osteoarthritis of knee: Secondary | ICD-10-CM | POA: Diagnosis not present

## 2016-12-26 DIAGNOSIS — M25569 Pain in unspecified knee: Secondary | ICD-10-CM

## 2016-12-26 LAB — SEDIMENTATION RATE: Sed Rate: 50 mm/hr — ABNORMAL HIGH (ref 0–30)

## 2016-12-26 LAB — C-REACTIVE PROTEIN: CRP: 0.3 mg/dL — ABNORMAL LOW (ref 0.5–20.0)

## 2016-12-26 LAB — TSH: TSH: 0.54 u[IU]/mL (ref 0.35–4.50)

## 2016-12-26 LAB — IBC PANEL
Iron: 65 ug/dL (ref 42–145)
SATURATION RATIOS: 18.9 % — AB (ref 20.0–50.0)
Transferrin: 246 mg/dL (ref 212.0–360.0)

## 2016-12-26 LAB — VITAMIN D 25 HYDROXY (VIT D DEFICIENCY, FRACTURES): VITD: 17.48 ng/mL — ABNORMAL LOW (ref 30.00–100.00)

## 2016-12-26 MED ORDER — DICLOFENAC SODIUM 2 % TD SOLN: 2.0000 g | Freq: Two times a day (BID) | TRANSDERMAL | 3 refills | Status: DC

## 2016-12-26 MED ORDER — VITAMIN D (ERGOCALCIFEROL) 1.25 MG (50000 UNIT) PO CAPS: 50000.0000 [IU] | ORAL_CAPSULE | ORAL | 0 refills | Status: DC

## 2016-12-26 MED ORDER — BETAMETHASONE SOD PHOS & ACET 6 (3-3) MG/ML IJ SUSP: 3.0000 mg | Freq: Once | INTRAMUSCULAR | Status: DC

## 2016-12-26 NOTE — Patient Instructions (Signed)
Good to see you.  Ice 20 minutes 2 times daily. Usually after activity and before bed. Exercises 3 times a week.  pennsaid pinkie amount topically 2 times daily as needed.  Lets get xray and labs today  See me again in 4 weeks.

## 2016-12-26 NOTE — Assessment & Plan Note (Signed)
Patient does have bilateral knee arthritis. Discussed with patient at great length. We discussed icing regimen and home exercises. We discussed which activities to do a which ones to avoid. Topical anti-inflammatory's prescribed. Once weekly vitamin D for muscle strength and endurance. Due to the severity of the arthritis at a young age we will get further laboratory workup to see if anything is potentially contribute in. Follow-up again in 4-6 weeks

## 2016-12-26 NOTE — Progress Notes (Signed)
Pre-visit discussion using our clinic review tool. No additional management support is needed unless otherwise documented below in the visit note.  

## 2016-12-27 LAB — RHEUMATOID FACTOR

## 2016-12-27 LAB — ANA: Anti Nuclear Antibody(ANA): NEGATIVE

## 2016-12-31 ENCOUNTER — Other Ambulatory Visit: Payer: Self-pay | Admitting: Internal Medicine

## 2016-12-31 ENCOUNTER — Telehealth (HOSPITAL_COMMUNITY): Payer: Self-pay | Admitting: *Deleted

## 2016-12-31 DIAGNOSIS — I10 Essential (primary) hypertension: Secondary | ICD-10-CM

## 2016-12-31 DIAGNOSIS — E118 Type 2 diabetes mellitus with unspecified complications: Secondary | ICD-10-CM

## 2016-12-31 MED ORDER — VALSARTAN 160 MG PO TABS: 160.0000 mg | ORAL_TABLET | Freq: Every day | ORAL | 1 refills | Status: DC

## 2016-12-31 NOTE — Telephone Encounter (Signed)
Patient given detailed instructions per Myocardial Perfusion Study Information Sheet for the test on 01/02/17 at 0945. Patient notified to arrive 15 minutes early and that it is imperative to arrive on time for appointment to keep from having the test rescheduled.  If you need to cancel or reschedule your appointment, please call the office within 24 hours of your appointment. Failure to do so may result in a cancellation of your appointment, and a $50 no show fee. Patient verbalized understanding.Virginia Jimenez, Virginia Jimenez

## 2016-12-31 NOTE — Progress Notes (Signed)
Attempted to call patient. None of the phone numbers are working ie. Busy or disconnected.

## 2017-01-01 NOTE — Progress Notes (Signed)
Spoke with patient in regards to Dr. Thompson Caul recommendations.

## 2017-01-02 ENCOUNTER — Other Ambulatory Visit: Payer: Self-pay | Admitting: Internal Medicine

## 2017-01-02 ENCOUNTER — Ambulatory Visit (HOSPITAL_COMMUNITY): Payer: 59 | Attending: Cardiovascular Disease

## 2017-01-02 DIAGNOSIS — R9431 Abnormal electrocardiogram [ECG] [EKG]: Secondary | ICD-10-CM | POA: Insufficient documentation

## 2017-01-02 DIAGNOSIS — R072 Precordial pain: Secondary | ICD-10-CM | POA: Insufficient documentation

## 2017-01-02 DIAGNOSIS — Z1231 Encounter for screening mammogram for malignant neoplasm of breast: Secondary | ICD-10-CM

## 2017-01-02 LAB — MYOCARDIAL PERFUSION IMAGING
CHL CUP RESTING HR STRESS: 63 {beats}/min
LVDIAVOL: 88 mL (ref 46–106)
LVSYSVOL: 33 mL
NUC STRESS TID: 0.83
Peak HR: 99 {beats}/min
RATE: 0.24
SDS: 0
SRS: 3
SSS: 3

## 2017-01-02 MED ORDER — TECHNETIUM TC 99M TETROFOSMIN IV KIT
33.0000 | PACK | Freq: Once | INTRAVENOUS | Status: AC | PRN
  Administered 2017-01-02: 33 via INTRAVENOUS
  Filled 2017-01-02: qty 33

## 2017-01-02 MED ORDER — TECHNETIUM TC 99M TETROFOSMIN IV KIT
11.0000 | PACK | Freq: Once | INTRAVENOUS | Status: AC | PRN
  Administered 2017-01-02: 11 via INTRAVENOUS
  Filled 2017-01-02: qty 11

## 2017-01-02 MED ORDER — REGADENOSON 0.4 MG/5ML IV SOLN
0.4000 mg | Freq: Once | INTRAVENOUS | Status: AC
  Administered 2017-01-02: 0.4 mg via INTRAVENOUS

## 2017-01-16 ENCOUNTER — Ambulatory Visit: Payer: 59 | Admitting: Podiatry

## 2017-01-23 ENCOUNTER — Ambulatory Visit: Payer: 59 | Admitting: Family Medicine

## 2017-02-06 ENCOUNTER — Encounter: Payer: Self-pay | Admitting: Podiatry

## 2017-02-06 ENCOUNTER — Ambulatory Visit (INDEPENDENT_AMBULATORY_CARE_PROVIDER_SITE_OTHER): Payer: 59 | Admitting: Podiatry

## 2017-02-06 ENCOUNTER — Ambulatory Visit: Payer: 59

## 2017-02-06 DIAGNOSIS — M722 Plantar fascial fibromatosis: Secondary | ICD-10-CM

## 2017-02-06 MED ORDER — MELOXICAM 15 MG PO TABS: 15.0000 mg | ORAL_TABLET | Freq: Every day | ORAL | 1 refills | Status: DC

## 2017-02-06 MED ORDER — METHYLPREDNISOLONE 4 MG PO TBPK: ORAL_TABLET | ORAL | 0 refills | Status: DC

## 2017-02-07 ENCOUNTER — Encounter: Payer: Self-pay | Admitting: Podiatry

## 2017-02-07 ENCOUNTER — Telehealth: Payer: Self-pay | Admitting: *Deleted

## 2017-02-07 NOTE — Telephone Encounter (Signed)
Pt was seen yesterday and was given 2 injections for plantar fasciitis and needs 2 days off. I instructed A. Solis - receptionist to write pt for the days off and to call pt to pick up the note.

## 2017-02-07 NOTE — Progress Notes (Signed)
   Subjective: Patient presents today for follow-up evaluation of pain and tenderness in the feet bilaterally. She states he is still experiencing some pain in the bilateral heels. She reports her left heel is better than the right. Patient presents today for further treatment and evaluation  Objective: Physical Exam General: The patient is alert and oriented x3 in no acute distress.  Dermatology: Skin is warm, dry and supple bilateral lower extremities. Negative for open lesions or macerations bilateral.   Vascular: Dorsalis Pedis and Posterior Tibial pulses palpable bilateral.  Capillary fill time is immediate to all digits.  Neurological: Epicritic and protective threshold intact bilateral.   Musculoskeletal: Tenderness to palpation at the medial calcaneal tubercale and through the insertion of the plantar fascia of the bilateral feet. All other joints range of motion within normal limits bilateral. Strength 5/5 in all groups bilateral.    Assessment: #1 plantar fasciitis bilateral feet #2 pain in bilateral feet  Plan of Care:  1. Patient evaluated.   2. Injection of 0.5cc Celestone soluspan injected into the bilateral heels.  3. Instructed patient regarding therapies and modalities at home to alleviate symptoms.  4. Rx for Meloxicam 15mg  PO given to patient.  5. Rx for Medrol Dosepak given to patient. 6. Recommend good, supportive sneakers and stretching.  7. Continue wearing plantar fasciitis braces. 8. Return to clinic in 4 weeks.   Edrick Kins, DPM [facility]  Dr. Edrick Kins, [address]                                        [place], [place] [address]                Office [phone]  Fax [phone]

## 2017-02-12 MED ORDER — BETAMETHASONE SOD PHOS & ACET 6 (3-3) MG/ML IJ SUSP: 3.0000 mg | Freq: Once | INTRAMUSCULAR | Status: DC

## 2017-02-13 ENCOUNTER — Ambulatory Visit: Payer: 59 | Admitting: Family Medicine

## 2017-02-20 ENCOUNTER — Ambulatory Visit: Payer: 59

## 2017-02-27 ENCOUNTER — Ambulatory Visit (AMBULATORY_SURGERY_CENTER): Payer: Self-pay

## 2017-02-27 ENCOUNTER — Encounter: Payer: Self-pay | Admitting: Gastroenterology

## 2017-02-27 VITALS — Ht 65.0 in | Wt 194.8 lb

## 2017-02-27 DIAGNOSIS — Z1211 Encounter for screening for malignant neoplasm of colon: Secondary | ICD-10-CM

## 2017-02-27 MED ORDER — NA SULFATE-K SULFATE-MG SULF 17.5-3.13-1.6 GM/177ML PO SOLN: 1.0000 | Freq: Once | ORAL | 0 refills | Status: AC

## 2017-02-27 NOTE — Progress Notes (Signed)
Denies allergies to eggs or soy products. Denies complication of anesthesia or sedation. Denies use of weight loss medication. Denies use of O2.   Emmi instructions declined. Patient has had multiple colonoscopies.

## 2017-03-06 ENCOUNTER — Encounter: Payer: Self-pay | Admitting: Podiatry

## 2017-03-06 ENCOUNTER — Telehealth: Payer: Self-pay | Admitting: Gastroenterology

## 2017-03-06 ENCOUNTER — Ambulatory Visit (INDEPENDENT_AMBULATORY_CARE_PROVIDER_SITE_OTHER): Payer: 59 | Admitting: Podiatry

## 2017-03-06 DIAGNOSIS — M722 Plantar fascial fibromatosis: Secondary | ICD-10-CM | POA: Diagnosis not present

## 2017-03-06 DIAGNOSIS — Z1211 Encounter for screening for malignant neoplasm of colon: Secondary | ICD-10-CM

## 2017-03-06 MED ORDER — NA SULFATE-K SULFATE-MG SULF 17.5-3.13-1.6 GM/177ML PO SOLN: ORAL | 0 refills | Status: DC

## 2017-03-06 NOTE — Telephone Encounter (Signed)
Pt will come to 4th floor today to pick up sample

## 2017-03-10 NOTE — Progress Notes (Signed)
   Subjective: Patient presents today for follow-up evaluation of pain and tenderness in the feet bilaterally. She states her condition is unchanged. She reports the injection helped alleviate the pain for 1 week. Taking meloxicam and wearing the fascial brace does not help improve her pain. She denies any new complaints at this time.  Objective: Physical Exam General: The patient is alert and oriented x3 in no acute distress.  Dermatology: Skin is warm, dry and supple bilateral lower extremities. Negative for open lesions or macerations bilateral.   Vascular: Dorsalis Pedis and Posterior Tibial pulses palpable bilateral.  Capillary fill time is immediate to all digits.  Neurological: Epicritic and protective threshold intact bilateral.   Musculoskeletal: Tenderness to palpation at the medial calcaneal tubercale and through the insertion of the plantar fascia of the bilateral feet. All other joints range of motion within normal limits bilateral. Strength 5/5 in all groups bilateral.    Assessment: #1 plantar fasciitis bilateral feet #2 pain in bilateral feet  Plan of Care:  1. Patient evaluated.   2. Discussed EPAT, physical therapy and custom orthotics. 3. Patient considering EPAT. Will call to schedule. 4. Return to clinic in 8 weeks.   Edrick Kins, DPM [facility]  Dr. Edrick Kins, [address]                                        [place], [place] [address]                Office [phone]  Fax [phone]

## 2017-03-13 ENCOUNTER — Ambulatory Visit (AMBULATORY_SURGERY_CENTER): Payer: 59 | Admitting: Gastroenterology

## 2017-03-13 ENCOUNTER — Encounter: Payer: Self-pay | Admitting: Gastroenterology

## 2017-03-13 VITALS — BP 113/77 | HR 61 | Temp 98.6°F | Resp 14 | Ht 65.0 in | Wt 194.0 lb

## 2017-03-13 DIAGNOSIS — Z1212 Encounter for screening for malignant neoplasm of rectum: Secondary | ICD-10-CM

## 2017-03-13 DIAGNOSIS — D123 Benign neoplasm of transverse colon: Secondary | ICD-10-CM | POA: Diagnosis not present

## 2017-03-13 DIAGNOSIS — Z1211 Encounter for screening for malignant neoplasm of colon: Secondary | ICD-10-CM | POA: Diagnosis present

## 2017-03-13 MED ORDER — SODIUM CHLORIDE 0.9 % IV SOLN: 500.0000 mL | INTRAVENOUS | Status: DC

## 2017-03-13 NOTE — Progress Notes (Signed)
Report to PACU, RN, vss, BBS= Clear.  

## 2017-03-13 NOTE — Progress Notes (Signed)
Called to room to assist during endoscopic procedure.  Patient ID and intended procedure confirmed with present staff. Received instructions for my participation in the procedure from the performing physician.  

## 2017-03-13 NOTE — Progress Notes (Signed)
No changes in medical or surgical hx since PV.  Pt had several mints in mouth upon admitting and J Monday CRNA made aware

## 2017-03-13 NOTE — Patient Instructions (Signed)
Discharge instructions given. Handout on polyps. No ibuprofen, naproxen, or other NSAIDs for two weeks. Resume previous medications. YOU HAD AN ENDOSCOPIC PROCEDURE TODAY AT [facility]:   Refer to the procedure report that was given to you for any specific questions about what was found during the examination.  If the procedure report does not answer your questions, please call your gastroenterologist to clarify.  If you requested that your care partner not be given the details of your procedure findings, then the procedure report has been included in a sealed envelope for you to review at your convenience later.  YOU SHOULD EXPECT: Some feelings of bloating in the abdomen. Passage of more gas than usual.  Walking can help get rid of the air that was put into your GI tract during the procedure and reduce the bloating. If you had a lower endoscopy (such as a colonoscopy or flexible sigmoidoscopy) you may notice spotting of blood in your stool or on the toilet paper. If you underwent a bowel prep for your procedure, you may not have a normal bowel movement for a few days.  Please Note:  You might notice some irritation and congestion in your nose or some drainage.  This is from the oxygen used during your procedure.  There is no need for concern and it should clear up in a day or so.  SYMPTOMS TO REPORT IMMEDIATELY:   Following lower endoscopy (colonoscopy or flexible sigmoidoscopy):  Excessive amounts of blood in the stool  Significant tenderness or worsening of abdominal pains  Swelling of the abdomen that is new, acute  Fever of 100F or higher   For urgent or emergent issues, a gastroenterologist can be reached at any hour by calling [phone].   DIET:  We do recommend a small meal at first, but then you may proceed to your regular diet.  Drink plenty of fluids but you should avoid alcoholic beverages for 24 hours.  ACTIVITY:  You should plan to take it easy for the  rest of today and you should NOT DRIVE or use heavy machinery until tomorrow (because of the sedation medicines used during the test).    FOLLOW UP: Our staff will call the number listed on your records the next business day following your procedure to check on you and address any questions or concerns that you may have regarding the information given to you following your procedure. If we do not reach you, we will leave a message.  However, if you are feeling well and you are not experiencing any problems, there is no need to return our call.  We will assume that you have returned to your regular daily activities without incident.  If any biopsies were taken you will be contacted by phone or by letter within the next 1-3 weeks.  Please call us at [phone] if you have not heard about the biopsies in 3 weeks.    SIGNATURES/CONFIDENTIALITY: You and/or your care partner have signed paperwork which will be entered into your electronic medical record.  These signatures attest to the fact that that the information above on your After Visit Summary has been reviewed and is understood.  Full responsibility of the confidentiality of this discharge information lies with you and/or your care-partner.

## 2017-03-13 NOTE — Op Note (Signed)
[facility] Patient Name: Virginia Jimenez Procedure Date: 03/13/2017 9:39 AM MRN: [id number] Endoscopist: Remo Lipps P. Armbruster MD, MD Age: 56 Referring MD:  Date of Birth: Jan 25, 1961 Gender: Female Account #: [id number] Procedure:                Colonoscopy Indications:              Screening for colorectal malignant neoplasm Medicines:                Monitored Anesthesia Care Procedure:                Pre-Anesthesia Assessment:                           - Prior to the procedure, a History and Physical                            was performed, and patient medications and                            allergies were reviewed. The patient's tolerance of                            previous anesthesia was also reviewed. The risks                            and benefits of the procedure and the sedation                            options and risks were discussed with the patient.                            All questions were answered, and informed consent                            was obtained. Prior Anticoagulants: The patient has                            taken no previous anticoagulant or antiplatelet                            agents. ASA Grade Assessment: II - A patient with                            mild systemic disease. After reviewing the risks                            and benefits, the patient was deemed in                            satisfactory condition to undergo the procedure.                           After obtaining informed consent, the colonoscope  was passed under direct vision. Throughout the                            procedure, the patient's blood pressure, pulse, and                            oxygen saturations were monitored continuously. The                            Model CF-HQ190L [id number]) scope was introduced                            through the anus and advanced to the the cecum,                            identified  by appendiceal orifice and ileocecal                            valve. The colonoscopy was performed without                            difficulty. The patient tolerated the procedure                            well. The quality of the bowel preparation was                            adequate. The ileocecal valve, appendiceal orifice,                            and rectum were photographed. Scope In: 9:42:03 AM Scope Out: 10:01:28 AM Scope Withdrawal Time: 0 hours 13 minutes 15 seconds  Total Procedure Duration: 0 hours 19 minutes 25 seconds  Findings:                 The perianal and digital rectal examinations were                            normal.                           A 3 mm polyp was found in the transverse colon. The                            polyp was sessile. The polyp was removed with a                            cold snare. Resection and retrieval were complete.                           The exam was otherwise without abnormality on                            direct and retroflexion views. Complications:  No immediate complications. Estimated blood loss:                            Minimal. Estimated Blood Loss:     Estimated blood loss was minimal. Impression:               - One 3 mm polyp in the transverse colon, removed                            with a cold snare. Resected and retrieved.                           - The examination was otherwise normal on direct                            and retroflexion views. Recommendation:           - Patient has a contact number available for                            emergencies. The signs and symptoms of potential                            delayed complications were discussed with the                            patient. Return to normal activities tomorrow.                            Written discharge instructions were provided to the                            patient.                           - Resume previous  diet.                           - Continue present medications.                           - Await pathology results.                           - Repeat colonoscopy is recommended for                            surveillance. The colonoscopy date will be                            determined after pathology results from today's                            exam become available for review.                           - No ibuprofen,  naproxen, or other non-steroidal                            anti-inflammatory drugs for 2 weeks after polyp                            removal. Remo Lipps P. Armbruster MD, MD 03/13/2017 10:04:11 AM This report has been signed electronically.

## 2017-03-14 ENCOUNTER — Telehealth: Payer: Self-pay

## 2017-03-14 NOTE — Telephone Encounter (Signed)
  Follow up Call-  Call back number 03/13/2017  Post procedure Call Back phone  # [phone]  Permission to leave phone message Yes  Some recent data might be hidden     Patient questions:  Do you have a fever, pain , or abdominal swelling? No. Pain Score  0 *  Have you tolerated food without any problems? Yes.    Have you been able to return to your normal activities? Yes.    Do you have any questions about your discharge instructions: Diet   No. Medications  No. Follow up visit  No.  Do you have questions or concerns about your Care? No.  Actions: * If pain score is 4 or above: No action needed, pain <4.

## 2017-03-19 LAB — HM COLONOSCOPY

## 2017-03-21 ENCOUNTER — Encounter: Payer: Self-pay | Admitting: Gastroenterology

## 2017-03-26 LAB — HM COLONOSCOPY

## 2017-05-01 ENCOUNTER — Ambulatory Visit: Payer: 59 | Admitting: Podiatry

## 2017-05-03 ENCOUNTER — Other Ambulatory Visit: Payer: Self-pay | Admitting: Internal Medicine

## 2017-05-03 ENCOUNTER — Telehealth: Payer: Self-pay | Admitting: Internal Medicine

## 2017-05-03 DIAGNOSIS — I10 Essential (primary) hypertension: Secondary | ICD-10-CM

## 2017-05-03 DIAGNOSIS — E118 Type 2 diabetes mellitus with unspecified complications: Secondary | ICD-10-CM

## 2017-05-03 MED ORDER — IRBESARTAN 150 MG PO TABS: 150.0000 mg | ORAL_TABLET | Freq: Every day | ORAL | 1 refills | Status: DC

## 2017-05-03 NOTE — Telephone Encounter (Signed)
Pharmacy called stating the Pt needs an alternative for valsartan (DIOVAN) 160 MG tablet  Called in  [place]  Please advise

## 2018-01-21 ENCOUNTER — Emergency Department (HOSPITAL_COMMUNITY): Payer: 59

## 2018-01-21 ENCOUNTER — Other Ambulatory Visit: Payer: Self-pay

## 2018-01-21 ENCOUNTER — Emergency Department (HOSPITAL_COMMUNITY)
Admission: EM | Admit: 2018-01-21 | Discharge: 2018-01-22 | Disposition: A | Discharge status: Home / Self Care | Payer: 59 | Attending: Emergency Medicine | Admitting: Emergency Medicine

## 2018-01-21 ENCOUNTER — Encounter (HOSPITAL_COMMUNITY): Payer: Self-pay

## 2018-01-21 DIAGNOSIS — Z5321 Procedure and treatment not carried out due to patient leaving prior to being seen by health care provider: Secondary | ICD-10-CM | POA: Diagnosis not present

## 2018-01-21 DIAGNOSIS — R1084 Generalized abdominal pain: Secondary | ICD-10-CM | POA: Diagnosis not present

## 2018-01-21 LAB — COMPREHENSIVE METABOLIC PANEL
ALBUMIN: 4 g/dL (ref 3.5–5.0)
ALK PHOS: 79 U/L (ref 38–126)
ALT: 15 U/L (ref 14–54)
ANION GAP: 15 (ref 5–15)
AST: 18 U/L (ref 15–41)
BUN: 10 mg/dL (ref 6–20)
CHLORIDE: 99 mmol/L — AB (ref 101–111)
CO2: 22 mmol/L (ref 22–32)
Calcium: 9.4 mg/dL (ref 8.9–10.3)
Creatinine, Ser: 0.68 mg/dL (ref 0.44–1.00)
GFR calc Af Amer: >60 mL/min (ref >60)
GFR calc non Af Amer: >60 mL/min (ref >60)
GLUCOSE: 247 mg/dL — AB (ref 65–99)
Potassium: 3.9 mmol/L (ref 3.5–5.1)
SODIUM: 136 mmol/L (ref 135–145)
Total Bilirubin: 1.1 mg/dL (ref 0.3–1.2)
Total Protein: 7.8 g/dL (ref 6.5–8.1)

## 2018-01-21 LAB — CBC WITH DIFFERENTIAL/PLATELET
BASOS PCT: 0 %
Basophils Absolute: 0 10*3/uL (ref 0.0–0.1)
EOS ABS: 0 10*3/uL (ref 0.0–0.7)
EOS PCT: 0 %
HCT: 41.9 % (ref 36.0–46.0)
HEMOGLOBIN: 14 g/dL (ref 12.0–15.0)
LYMPHS ABS: 0.7 10*3/uL (ref 0.7–4.0)
Lymphocytes Relative: 8 %
MCH: 30.8 pg (ref 26.0–34.0)
MCHC: 33.4 g/dL (ref 30.0–36.0)
MCV: 92.3 fL (ref 78.0–100.0)
Monocytes Absolute: 0.3 10*3/uL (ref 0.1–1.0)
Monocytes Relative: 3 %
Neutro Abs: 7.8 10*3/uL — ABNORMAL HIGH (ref 1.7–7.7)
Neutrophils Relative %: 89 %
PLATELETS: 178 10*3/uL (ref 150–400)
RBC: 4.54 MIL/uL (ref 3.87–5.11)
RDW: 12.2 % (ref 11.5–15.5)
WBC: 8.8 10*3/uL (ref 4.0–10.5)

## 2018-01-21 LAB — I-STAT TROPONIN, ED: Troponin i, poc: 0 ng/mL (ref 0.00–0.08)

## 2018-01-21 LAB — LIPASE, BLOOD: Lipase: 20 U/L (ref 11–51)

## 2018-01-21 MED ORDER — ONDANSETRON 4 MG PO TBDP
4.0000 mg | ORAL_TABLET | Freq: Once | ORAL | Status: AC
  Administered 2018-01-21: 4 mg via ORAL
  Filled 2018-01-21: qty 1

## 2018-01-21 NOTE — ED Provider Notes (Signed)
Patient placed in Quick Look pathway, seen and evaluated   Chief Complaint: Nausea, vomiting, diarrhea and chest pain  HPI: Patient with past surgical history of cesarean section x2 presents with acute onset of vomiting (greater than 30 episodes per patient), frequent nonbloody watery stools today.  Patient has a generalized abdominal pain described as cramping.  Patient does have some mid chest pains at times with her symptoms.  No associated shortness of breath, cough.  No fevers reported.  No urinary symptoms. The onset of this condition was acute. The course is constant. Aggravating factors: none. Alleviating factors: none.   ROS:  Positive ROS: (+) Nausea, vomiting, diarrhea, abdominal pain, chest pain Negative ROS: (-) Shortness of breath, cough, fever  Physical Exam:   Gen: No distress  Neuro: Awake and Alert  Skin: Warm    Focused Exam: Heart RRR, nml S1,S2, no m/r/g; Lungs CTAB; Abd soft, minimal generalized tenderness, no rebound or guarding; Ext 2+ pedal pulses bilaterally, no edema.  BP (!) 157/73 (BP Location: Right Arm)   Pulse 91   Temp 99.3 F (37.4 C) (Oral)   Resp 16   LMP 06/01/2012   SpO2 100%   Plan: Labs ordered, EKG reviewed.  Nonspecific T wave changes.  Patient does not report a cardiac history.  Symptoms are mainly abdominal in nature, however will check chest x-ray and troponin with chest pains and abnormal EKG.  Initiation of care has begun. The patient has been counseled on the process, plan, and necessity for staying for the completion/evaluation, and the remainder of the medical screening examination     Carlisle Cater, PA-C 01/21/18 1900

## 2018-01-21 NOTE — ED Triage Notes (Signed)
Pt presents with onset of generalized abdominal pain, nausea, vomiting and diarrhea.  Pt reports abdominal pain radiates into her chest with shortness of breath.

## 2018-01-22 ENCOUNTER — Emergency Department (HOSPITAL_COMMUNITY)
Admission: EM | Admit: 2018-01-22 | Discharge: 2018-01-23 | Disposition: A | Discharge status: Home / Self Care | Payer: 59 | Source: Home / Self Care | Attending: Emergency Medicine | Admitting: Emergency Medicine

## 2018-01-22 ENCOUNTER — Other Ambulatory Visit: Payer: Self-pay

## 2018-01-22 ENCOUNTER — Encounter (HOSPITAL_COMMUNITY): Payer: Self-pay

## 2018-01-22 ENCOUNTER — Emergency Department (HOSPITAL_COMMUNITY): Payer: 59

## 2018-01-22 DIAGNOSIS — E119 Type 2 diabetes mellitus without complications: Secondary | ICD-10-CM | POA: Insufficient documentation

## 2018-01-22 DIAGNOSIS — Z79899 Other long term (current) drug therapy: Secondary | ICD-10-CM | POA: Insufficient documentation

## 2018-01-22 DIAGNOSIS — K529 Noninfective gastroenteritis and colitis, unspecified: Secondary | ICD-10-CM

## 2018-01-22 DIAGNOSIS — Z7984 Long term (current) use of oral hypoglycemic drugs: Secondary | ICD-10-CM

## 2018-01-22 DIAGNOSIS — I10 Essential (primary) hypertension: Secondary | ICD-10-CM | POA: Insufficient documentation

## 2018-01-22 LAB — CBG MONITORING, ED: Glucose-Capillary: 273 mg/dL — ABNORMAL HIGH (ref 65–99)

## 2018-01-22 LAB — COMPREHENSIVE METABOLIC PANEL
ALT: 12 U/L — ABNORMAL LOW (ref 14–54)
AST: 15 U/L (ref 15–41)
Albumin: 3.6 g/dL (ref 3.5–5.0)
Alkaline Phosphatase: 72 U/L (ref 38–126)
Anion gap: 10 (ref 5–15)
BUN: 14 mg/dL (ref 6–20)
CO2: 26 mmol/L (ref 22–32)
Calcium: 9.1 mg/dL (ref 8.9–10.3)
Chloride: 97 mmol/L — ABNORMAL LOW (ref 101–111)
Creatinine, Ser: 0.88 mg/dL (ref 0.44–1.00)
GFR calc Af Amer: >60 mL/min (ref >60)
GFR calc non Af Amer: >60 mL/min (ref >60)
Glucose, Bld: 258 mg/dL — ABNORMAL HIGH (ref 65–99)
Potassium: 3.8 mmol/L (ref 3.5–5.1)
Sodium: 133 mmol/L — ABNORMAL LOW (ref 135–145)
Total Bilirubin: 1.3 mg/dL — ABNORMAL HIGH (ref 0.3–1.2)
Total Protein: 7.5 g/dL (ref 6.5–8.1)

## 2018-01-22 LAB — URINALYSIS, ROUTINE W REFLEX MICROSCOPIC
Bacteria, UA: NONE SEEN
Bilirubin Urine: NEGATIVE
Glucose, UA: >=500 mg/dL — AB
Hgb urine dipstick: NEGATIVE
Ketones, ur: 5 mg/dL — AB
Leukocytes, UA: NEGATIVE
Nitrite: NEGATIVE
Protein, ur: NEGATIVE mg/dL
Specific Gravity, Urine: 1.018 (ref 1.005–1.030)
pH: 6 (ref 5.0–8.0)

## 2018-01-22 LAB — CBC WITH DIFFERENTIAL/PLATELET
Basophils Absolute: 0 10*3/uL (ref 0.0–0.1)
Basophils Relative: 0 %
Eosinophils Absolute: 0 10*3/uL (ref 0.0–0.7)
Eosinophils Relative: 0 %
HCT: 41.1 % (ref 36.0–46.0)
Hemoglobin: 13.4 g/dL (ref 12.0–15.0)
Lymphocytes Relative: 8 %
Lymphs Abs: 0.7 10*3/uL (ref 0.7–4.0)
MCH: 30.5 pg (ref 26.0–34.0)
MCHC: 32.6 g/dL (ref 30.0–36.0)
MCV: 93.4 fL (ref 78.0–100.0)
Monocytes Absolute: 0.6 10*3/uL (ref 0.1–1.0)
Monocytes Relative: 6 %
Neutro Abs: 8.2 10*3/uL — ABNORMAL HIGH (ref 1.7–7.7)
Neutrophils Relative %: 86 %
Platelets: 169 10*3/uL (ref 150–400)
RBC: 4.4 MIL/uL (ref 3.87–5.11)
RDW: 12.5 % (ref 11.5–15.5)
WBC: 9.6 10*3/uL (ref 4.0–10.5)

## 2018-01-22 MED ORDER — MORPHINE SULFATE (PF) 4 MG/ML IV SOLN
4.0000 mg | Freq: Once | INTRAVENOUS | Status: AC
  Administered 2018-01-22: 4 mg via INTRAVENOUS
  Filled 2018-01-22: qty 1

## 2018-01-22 MED ORDER — SODIUM CHLORIDE 0.9 % IV BOLUS
1000.0000 mL | Freq: Once | INTRAVENOUS | Status: AC
  Administered 2018-01-22: 1000 mL via INTRAVENOUS

## 2018-01-22 MED ORDER — PROMETHAZINE HCL 25 MG PO TABS: 25.0000 mg | ORAL_TABLET | Freq: Four times a day (QID) | ORAL | 0 refills | Status: DC | PRN

## 2018-01-22 MED ORDER — IOHEXOL 300 MG/ML  SOLN
100.0000 mL | Freq: Once | INTRAMUSCULAR | Status: AC | PRN
  Administered 2018-01-22: 100 mL via INTRAVENOUS

## 2018-01-22 MED ORDER — ONDANSETRON HCL 4 MG/2ML IJ SOLN
4.0000 mg | Freq: Once | INTRAMUSCULAR | Status: AC
  Administered 2018-01-22: 4 mg via INTRAVENOUS
  Filled 2018-01-22: qty 2

## 2018-01-22 MED ORDER — TRAMADOL HCL 50 MG PO TABS: 50.0000 mg | ORAL_TABLET | Freq: Four times a day (QID) | ORAL | 0 refills | Status: DC | PRN

## 2018-01-22 NOTE — ED Provider Notes (Signed)
[facility] EMERGENCY DEPARTMENT Provider Note   CSN: [id number] Arrival date & time: 01/22/18  0848     History   Chief Complaint No chief complaint on file.   HPI Virginia Jimenez is a 57 y.o. female.  HPI Patient presents to the emergency department with abdominal pain that started yesterday.  Patient had nausea vomiting diarrhea associated with abdominal discomfort this been generalized.  Patient states that the pain is been ongoing over the last 2 days.  She states that mainly she had vomiting and diarrhea.  She states she has some crampy abdominal discomfort.  The patient denies chest pain, shortness of breath, headache,blurred vision, neck pain, fever, cough, weakness, numbness, dizziness, anorexia, edema,  rash, back pain, dysuria, hematemesis, bloody stool, near syncope, or syncope. Past Medical History:  Diagnosis Date  . Depression   . Diabetes mellitus   . GERD (gastroesophageal reflux disease)   . Hyperlipidemia   . Hypertension     Patient Active Problem List   Diagnosis Date Noted  . Degenerative arthritis of knee, bilateral 12/26/2016  . Abnormal EKG 12/12/2016  . Precordial chest pain 11/14/2016  . Overgrown toenails 11/14/2016  . Chronic pain of both knees 11/14/2016  . Hyperlipidemia with target LDL less than 100 04/15/2013  . Obesity (BMI 30.0-34.9) 04/15/2013  . GERD (gastroesophageal reflux disease) 03/04/2013  . Essential hypertension, benign 04/03/2012  . Visit for screening mammogram 04/03/2012  . Type II diabetes mellitus with manifestations ([place]) 04/03/2012  . Depression, acute 04/03/2012    Past Surgical History:  Procedure Laterality Date  . COLONOSCOPY    . TUBAL LIGATION       OB History    Gravida  2   Para  2   Term      Preterm      AB      Living  2     SAB      TAB      Ectopic      Multiple      Live Births               Home Medications    Prior to Admission medications     Medication Sig Start Date End Date Taking? Authorizing Provider  Ascorbic Acid (VITAMIN C) 100 MG tablet Take 100 mg by mouth daily.    [provider]  aspirin EC 81 MG tablet Take 1 tablet (81 mg total) by mouth daily. 12/12/16   Janith Lima, MD  atorvastatin (LIPITOR) 40 MG tablet Take 1 tablet (40 mg total) by mouth daily. 11/14/16   Janith Lima, MD  chlorthalidone (HYGROTON) 25 MG tablet Take 0.5 tablets (12.5 mg total) by mouth daily. 12/18/16   Janith Lima, MD  dexlansoprazole (DEXILANT) 60 MG capsule Take 1 capsule (60 mg total) by mouth daily. 04/28/15   Janith Lima, MD  Diclofenac Sodium (PENNSAID) 2 % SOLN Place 2 g onto the skin 2 (two) times daily. 12/26/16   Lyndal Pulley, DO  ferrous sulfate 325 (65 FE) MG EC tablet Take 325 mg by mouth 3 (three) times daily with meals.    [provider]  irbesartan (AVAPRO) 150 MG tablet Take 1 tablet (150 mg total) by mouth daily. 05/03/17   Janith Lima, MD  meloxicam (MOBIC) 15 MG tablet Take 1 tablet (15 mg total) by mouth daily. 02/06/17   Edrick Kins, DPM  metFORMIN (GLUCOPHAGE-XR) 750 MG 24 hr tablet Take 2  tablets (1,500 mg total) by mouth daily with breakfast. 11/14/16   Janith Lima, MD  Multiple Vitamin (MULTIVITAMIN) tablet Take 1 tablet by mouth daily.    [provider]  Omega-3 Fatty Acids (OMEGA 3 PO) Take 2 capsules by mouth daily.    [provider]  OVER THE COUNTER MEDICATION Take 1 tablet by mouth daily. Tumeric 1 tab daily.    [provider]  Vitamin D, Ergocalciferol, (DRISDOL) 50000 units CAPS capsule Take 1 capsule (50,000 Units total) by mouth every 7 (seven) days. 12/26/16   Lyndal Pulley, DO    Family History Family History  Problem Relation Age of Onset  . Lung cancer Mother   . Hypertension Mother   . Cancer Mother        lung  . Arthritis Father   . Heart disease Father   . Hypertension Father   . Diabetes Father   . Cancer Paternal  Grandmother        colon  . Early death Neg Hx   . Kidney disease Neg Hx   . Stroke Neg Hx   . Colon polyps Neg Hx   . Esophageal cancer Neg Hx   . Rectal cancer Neg Hx   . Stomach cancer Neg Hx   . Colon cancer Neg Hx     Social History Social History   Tobacco Use  . Smoking status: Never Smoker  . Smokeless tobacco: Never Used  Substance Use Topics  . Alcohol use: Yes    Comment: social  . Drug use: No     Allergies   Patient has no known allergies.   Review of Systems Review of Systems All other systems negative except as documented in the HPI. All pertinent positives and negatives as reviewed in the HPI.  Physical Exam Updated Vital Signs BP (!) 135/93   Pulse (!) 107   Temp 98.7 F (37.1 C) (Oral)   Resp 18   LMP 06/01/2012   SpO2 97%   Physical Exam  Constitutional: She is oriented to person, place, and time. She appears well-developed and well-nourished. No distress.  HENT:  Head: Normocephalic and atraumatic.  Mouth/Throat: Oropharynx is clear and moist.  Eyes: Pupils are equal, round, and reactive to light.  Neck: Normal range of motion. Neck supple.  Cardiovascular: Normal rate, regular rhythm and normal heart sounds. Exam reveals no gallop and no friction rub.  No murmur heard. Pulmonary/Chest: Effort normal and breath sounds normal. No respiratory distress. She has no wheezes.  Abdominal: Soft. Bowel sounds are normal. She exhibits no shifting dullness, no distension, no abdominal bruit, no ascites and no mass. There is no hepatosplenomegaly. There is generalized tenderness. There is no rigidity, no rebound and no guarding. No hernia.  Neurological: She is alert and oriented to person, place, and time. She exhibits normal muscle tone. Coordination normal.  Skin: Skin is warm and dry. Capillary refill takes less than 2 seconds. No rash noted. No erythema.  Psychiatric: She has a normal mood and affect. Her behavior is normal.  Nursing note and  vitals reviewed.    ED Treatments / Results  Labs (all labs ordered are listed, but only abnormal results are displayed) Labs Reviewed  URINALYSIS, ROUTINE W REFLEX MICROSCOPIC - Abnormal; Notable for the following components:      Result Value   Glucose, UA >=500 (*)    Ketones, ur 5 (*)    All other components within normal limits  COMPREHENSIVE METABOLIC PANEL -  Abnormal; Notable for the following components:   Sodium 133 (*)    Chloride 97 (*)    Glucose, Bld 258 (*)    ALT 12 (*)    Total Bilirubin 1.3 (*)    All other components within normal limits  CBC WITH DIFFERENTIAL/PLATELET - Abnormal; Notable for the following components:   Neutro Abs 8.2 (*)    All other components within normal limits  CBG MONITORING, ED - Abnormal; Notable for the following components:   Glucose-Capillary 273 (*)    All other components within normal limits    EKG None  Radiology Ct Abdomen Pelvis W Contrast  Result Date: 01/22/2018 CLINICAL DATA:  Generalized abdominal pain with bilateral arm numbness. EXAM: CT ABDOMEN AND PELVIS WITH CONTRAST TECHNIQUE: Multidetector CT imaging of the abdomen and pelvis was performed using the standard protocol following bolus administration of intravenous contrast. CONTRAST:  75 cc Omnipaque 300 COMPARISON:  Radiography yesterday. FINDINGS: Lower chest: Lung bases are clear.  No pleural or pericardial fluid. Hepatobiliary: Diffuse fatty change of the liver. No focal liver lesion. No calcified gallstones. Pancreas: Normal Spleen: Normal Adrenals/Urinary Tract: Adrenal glands are normal. Kidneys are normal. Bladder is normal. Stomach/Bowel: No abnormality. No sign of obstruction or inflammatory disease. I do not see the appendix, either normal or abnormal. Vascular/Lymphatic: Aortic atherosclerosis, minimal. IVC is normal. No retroperitoneal adenopathy. Reproductive: Normal Other: No free fluid or air. Musculoskeletal: Ordinary lower lumbar degenerative changes.  IMPRESSION: Diffuse fatty change of the liver. Otherwise unremarkable scan. No abnormality seen otherwise to explain abdominal pain. Electronically Signed   By: Nelson Chimes M.D.   On: 01/22/2018 17:05   Dg Abdomen Acute W/chest  Result Date: 01/21/2018 CLINICAL DATA:  57 year old female with epigastric, umbilical pain. Nausea vomiting diarrhea. EXAM: DG ABDOMEN ACUTE W/ 1V CHEST COMPARISON:  Portable chest 11/24/2011. FINDINGS: Improved lung volumes. No pneumothorax or pneumoperitoneum. No abnormal pulmonary opacity. Normal cardiac size and mediastinal contours. Visualized tracheal air column is within normal limits. Upright and supine views of the abdomen and pelvis. Bowel gas pattern within normal limits. Abdominal and pelvic visceral contours are within normal limits. Small pelvic phleboliths. Two surgical clips project over the upper chest and are new since 2013. No acute osseous abnormality identified. IMPRESSION: 1.  Normal bowel gas pattern, no free air. 2.  No acute cardiopulmonary abnormality. Electronically Signed   By: Genevie Ann M.D.   On: 01/21/2018 19:55    Procedures Procedures (including critical care time)  Medications Ordered in ED Medications  sodium chloride 0.9 % bolus 1,000 mL (1,000 mLs Intravenous New Bag/Given 01/22/18 1845)  sodium chloride 0.9 % bolus 1,000 mL (0 mLs Intravenous Stopped 01/22/18 1630)  ondansetron (ZOFRAN) injection 4 mg (4 mg Intravenous Given 01/22/18 1541)  morphine 4 MG/ML injection 4 mg (4 mg Intravenous Given 01/22/18 1542)  iohexol (OMNIPAQUE) 300 MG/ML solution 100 mL (100 mLs Intravenous Contrast Given 01/22/18 1632)     Initial Impression / Assessment and Plan / ED Course  I have reviewed the triage vital signs and the nursing notes.  Pertinent labs & imaging results that were available during my care of the patient were reviewed by me and considered in my medical decision making (see chart for details).   Patient had a negative CT scan of her abdomen  that was obtained due to the fact she was having generalized abdominal pain with her symptoms.  Patient is advised to return here for any worsening in her condition.  The patient states  that she is feeling better at this time following fluids.   Final Clinical Impressions(s) / ED Diagnoses   Final diagnoses:  None    ED Discharge Orders    None       Dalia Heading, PA-C 01/26/18 1312    Valarie Merino, MD 01/27/18 1149

## 2018-01-22 NOTE — ED Triage Notes (Signed)
Patient here yesterday and had labs collected with no urine for abdominal pain, states that the pain is generalized and some ongoing arm numbness. Alert and oriented, no vomiting, no diarrhea

## 2018-01-22 NOTE — ED Notes (Signed)
Unable to locate pt in waiting area, pt told tech earlier she was thinking about leaving due to wait times.

## 2018-01-22 NOTE — Discharge Instructions (Addendum)
Return here as needed.  Follow-up with your doctor for recheck.  Slowly increase your fluid intake. °

## 2018-01-30 ENCOUNTER — Ambulatory Visit: Payer: 59 | Admitting: Family

## 2018-02-11 ENCOUNTER — Inpatient Hospital Stay: Payer: 59 | Admitting: Internal Medicine

## 2018-02-11 ENCOUNTER — Encounter

## 2018-03-11 NOTE — Progress Notes (Signed)
[facility] [address], [place] [address] Phone: [phone] Subjective:     CC: Bilateral knee pain  TDD:[id number]  Virginia Jimenez is a 57 y.o. female coming in with complaint of bilateral knee pain.  Patient did have severe arthritis in the knees bilaterally.  Patient was last seen greater than a year ago.  He did very well with injections.  Started to have pain again for the last multiple months.  Increasing discomfort and pain.  Sometimes feels like there is some increasing instability.  Noticing some swelling.     Past Medical History:  Diagnosis Date  . Depression   . Diabetes mellitus   . GERD (gastroesophageal reflux disease)   . Hyperlipidemia   . Hypertension    Past Surgical History:  Procedure Laterality Date  . COLONOSCOPY    . TUBAL LIGATION     Social History   Socioeconomic History  . Marital status: Divorced    Spouse name: Not on file  . Number of children: Not on file  . Years of education: Not on file  . Highest education level: Not on file  Occupational History  . Not on file  Social Needs  . Financial resource strain: Not on file  . Food insecurity:    Worry: Not on file    Inability: Not on file  . Transportation needs:    Medical: Not on file    Non-medical: Not on file  Tobacco Use  . Smoking status: Never Smoker  . Smokeless tobacco: Never Used  Substance and Sexual Activity  . Alcohol use: Yes    Comment: social  . Drug use: No  . Sexual activity: Yes    Partners: Male    Birth control/protection: Surgical  Lifestyle  . Physical activity:    Days per week: Not on file    Minutes per session: Not on file  . Stress: Not on file  Relationships  . Social connections:    Talks on phone: Not on file    Gets together: Not on file    Attends religious service: Not on file    Active member of club or organization: Not on file    Attends meetings of clubs or organizations: Not on file   Relationship status: Not on file  Other Topics Concern  . Not on file  Social History Narrative  . Not on file   No Known Allergies Family History  Problem Relation Age of Onset  . Lung cancer Mother   . Hypertension Mother   . Cancer Mother        lung  . Arthritis Father   . Heart disease Father   . Hypertension Father   . Diabetes Father   . Cancer Paternal Grandmother        colon  . Early death Neg Hx   . Kidney disease Neg Hx   . Stroke Neg Hx   . Colon polyps Neg Hx   . Esophageal cancer Neg Hx   . Rectal cancer Neg Hx   . Stomach cancer Neg Hx   . Colon cancer Neg Hx      Past medical history, social, surgical and family history all reviewed in electronic medical record.  No pertanent information unless stated regarding to the chief complaint.   Review of Systems:Review of systems updated and as accurate as of 03/12/18  No headache, visual changes, nausea, vomiting, diarrhea, constipation, dizziness, abdominal pain, skin rash, fevers, chills,  night sweats, weight loss, swollen lymph nodes, body aches, joint swelling, chest pain, shortness of breath, mood changes.  Positive muscle aches  Objective  Blood pressure (!) 158/90, pulse 73, height 5\' 5"  (1.651 m), weight 198 lb (89.8 kg), last menstrual period 06/01/2012, SpO2 98 %. Systems examined below as of 03/12/18   General: No apparent distress alert and oriented x3 mood and affect normal, dressed appropriately.  HEENT: Pupils equal, extraocular movements intact  Respiratory: Patient's speak in full sentences and does not appear short of breath  Cardiovascular: No lower extremity edema, non tender, no erythema  Skin: Warm dry intact with no signs of infection or rash on extremities or on axial skeleton.  Abdomen: Soft nontender  Neuro: Cranial nerves II through XII are intact, neurovascularly intact in all extremities with 2+ DTRs and 2+ pulses.  Lymph: No lymphadenopathy of posterior or anterior cervical chain  or axillae bilaterally.  Gait normal with good balance and coordination.  MSK:  Non tender with full range of motion and good stability and symmetric strength and tone of shoulders, elbows, wrist, hip, and ankles bilaterally.  Knee: Bilateral valgus deformity noted.  Abnormal thigh to calf ratio.  Tender to palpation over medial and PF joint line.  ROM full in flexion and extension and lower leg rotation. instability with valgus force.  painful patellar compression. Patellar glide with moderate crepitus. Patellar and quadriceps tendons unremarkable. Hamstring and quadriceps strength is normal.  After informed written and verbal consent, patient was seated on exam table. Right knee was prepped with alcohol swab and utilizing anterolateral approach, patient's right knee space was injected with 4:1  marcaine 0.5%: Kenalog 40mg /dL. Patient tolerated the procedure well without immediate complications.  After informed written and verbal consent, patient was seated on exam table. Left knee was prepped with alcohol swab and utilizing anterolateral approach, patient's left knee space was injected with 4:1  marcaine 0.5%: Kenalog 40mg /dL. Patient tolerated the procedure well without immediate complications.     Impression and Recommendations:     This case required medical decision making of moderate complexity.      Note: This dictation was prepared with Dragon dictation along with smaller phrase technology. Any transcriptional errors that result from this process are unintentional.

## 2018-03-12 ENCOUNTER — Inpatient Hospital Stay: Payer: 59 | Admitting: Internal Medicine

## 2018-03-12 ENCOUNTER — Ambulatory Visit: Payer: 59 | Admitting: Family Medicine

## 2018-03-12 ENCOUNTER — Encounter: Payer: Self-pay | Admitting: Family Medicine

## 2018-03-12 DIAGNOSIS — M170 Bilateral primary osteoarthritis of knee: Secondary | ICD-10-CM

## 2018-03-12 NOTE — Patient Instructions (Signed)
Good to see you  Ice is your friend  We will get you approved for the other injections Continue the vitamins See me again in 4-5 weeks

## 2018-03-12 NOTE — Assessment & Plan Note (Signed)
Knee injections given today.  Bilateral.  Tolerated the procedure well.  Discussed icing regimen and home exercise.  Discussed which activities to do which wants to avoid.  We discussed the possibility of Visco supplementation will see if we can get prior approval.  Follow-up with me again in 4 weeks

## 2018-03-26 ENCOUNTER — Inpatient Hospital Stay: Payer: 59 | Admitting: Internal Medicine

## 2018-04-02 ENCOUNTER — Inpatient Hospital Stay: Payer: Self-pay | Admitting: Internal Medicine

## 2018-04-23 ENCOUNTER — Ambulatory Visit: Payer: BLUE CROSS/BLUE SHIELD | Admitting: Internal Medicine

## 2018-04-23 ENCOUNTER — Encounter: Payer: Self-pay | Admitting: Internal Medicine

## 2018-04-23 VITALS — BP 160/90 | HR 73 | Temp 98.1°F | Ht 65.0 in | Wt 197.0 lb

## 2018-04-23 DIAGNOSIS — E785 Hyperlipidemia, unspecified: Secondary | ICD-10-CM

## 2018-04-23 DIAGNOSIS — Z0000 Encounter for general adult medical examination without abnormal findings: Secondary | ICD-10-CM | POA: Diagnosis not present

## 2018-04-23 DIAGNOSIS — E559 Vitamin D deficiency, unspecified: Secondary | ICD-10-CM | POA: Insufficient documentation

## 2018-04-23 DIAGNOSIS — I10 Essential (primary) hypertension: Secondary | ICD-10-CM | POA: Diagnosis not present

## 2018-04-23 DIAGNOSIS — E118 Type 2 diabetes mellitus with unspecified complications: Secondary | ICD-10-CM

## 2018-04-23 DIAGNOSIS — E669 Obesity, unspecified: Secondary | ICD-10-CM

## 2018-04-23 DIAGNOSIS — Z1239 Encounter for other screening for malignant neoplasm of breast: Secondary | ICD-10-CM

## 2018-04-23 DIAGNOSIS — Z124 Encounter for screening for malignant neoplasm of cervix: Secondary | ICD-10-CM | POA: Insufficient documentation

## 2018-04-23 DIAGNOSIS — M170 Bilateral primary osteoarthritis of knee: Secondary | ICD-10-CM

## 2018-04-23 DIAGNOSIS — B351 Tinea unguium: Secondary | ICD-10-CM

## 2018-04-23 MED ORDER — CHLORTHALIDONE 25 MG PO TABS: 25.0000 mg | ORAL_TABLET | Freq: Every day | ORAL | 1 refills | Status: DC

## 2018-04-23 MED ORDER — IRBESARTAN 150 MG PO TABS: 150.0000 mg | ORAL_TABLET | Freq: Every day | ORAL | 1 refills | Status: DC

## 2018-04-23 MED ORDER — TRAMADOL HCL 50 MG PO TABS: 50.0000 mg | ORAL_TABLET | Freq: Four times a day (QID) | ORAL | 2 refills | Status: DC | PRN

## 2018-04-23 MED ORDER — TERBINAFINE HCL 250 MG PO TABS: 250.0000 mg | ORAL_TABLET | Freq: Every day | ORAL | 0 refills | Status: DC

## 2018-04-23 MED ORDER — VITAMIN D (ERGOCALCIFEROL) 1.25 MG (50000 UNIT) PO CAPS: 50000.0000 [IU] | ORAL_CAPSULE | ORAL | 1 refills | Status: DC

## 2018-04-23 MED ORDER — LIRAGLUTIDE -WEIGHT MANAGEMENT 18 MG/3ML ~~LOC~~ SOPN: 3.0000 mg | PEN_INJECTOR | Freq: Every day | SUBCUTANEOUS | 5 refills | Status: DC

## 2018-04-23 NOTE — Patient Instructions (Signed)
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
## 2018-04-30 ENCOUNTER — Telehealth: Payer: Self-pay | Admitting: *Deleted

## 2018-04-30 NOTE — Telephone Encounter (Signed)
Saxenda PA initiated via CoverMyMeds.  Key: [id number] Rx #: [id number]

## 2018-05-05 NOTE — Telephone Encounter (Signed)
PA for saxenda was denied

## 2018-05-05 NOTE — Telephone Encounter (Signed)
Left detailed message stating the PA was denied due to the rx being a formulary exclusion. Requesting pt to call back if she has any questions or if I can be of any additional assistance.

## 2018-05-14 NOTE — Telephone Encounter (Signed)
Called patient to inform we have a paper she needs to come by our office and sign for the PA.

## 2018-06-04 IMAGING — DX DG KNEE COMPLETE 4+V*R*
4 series · 4 of 4 positions shown · non-contrast
Comparison: None.

CLINICAL DATA: Right knee pain for several years, no known injury,
initial encounter

EXAM:
RIGHT KNEE - COMPLETE 4+ VIEW

[knee ap]
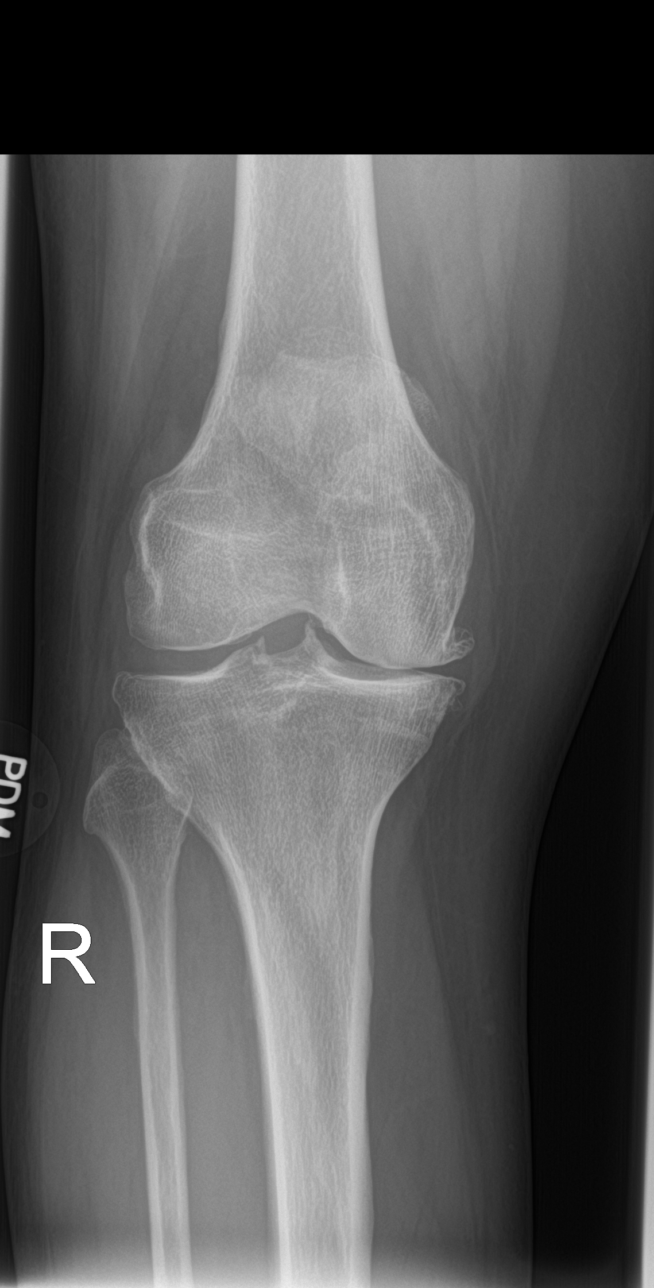

[knee tunnel]
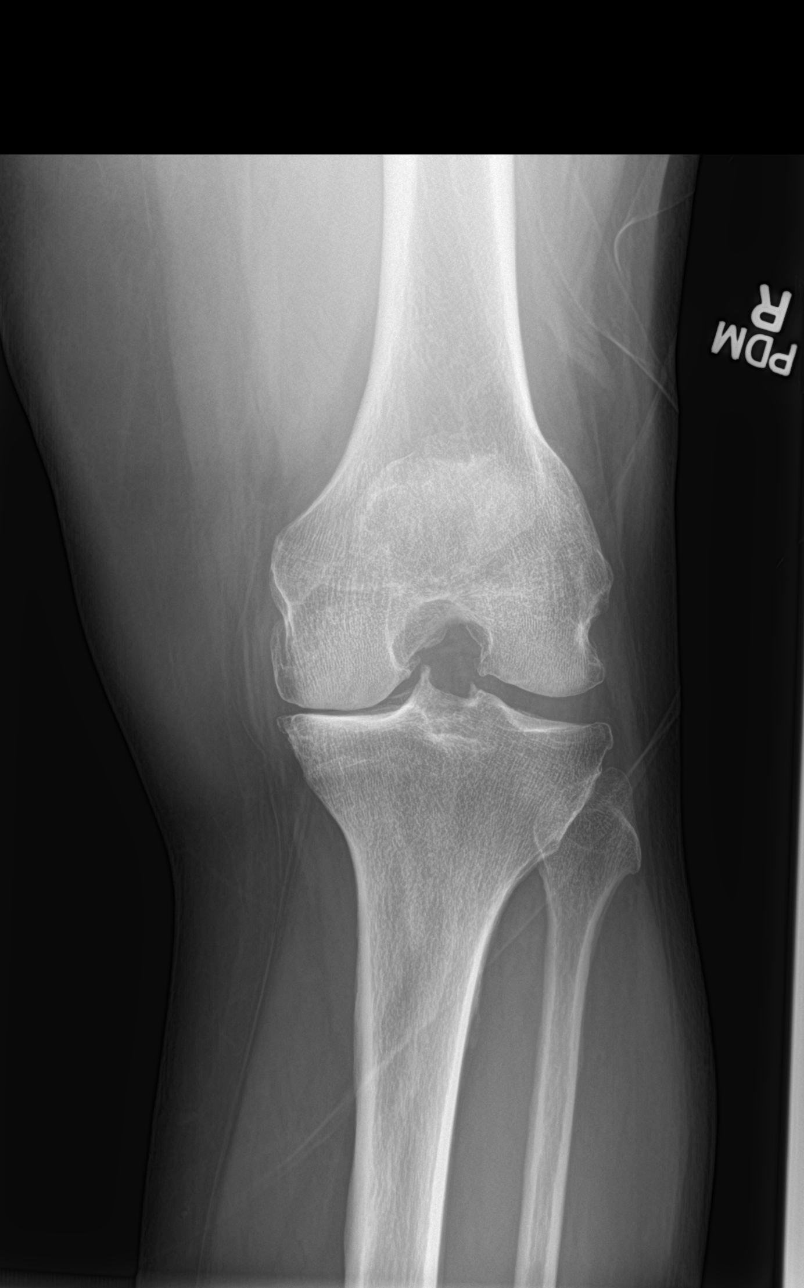

[knee lat]
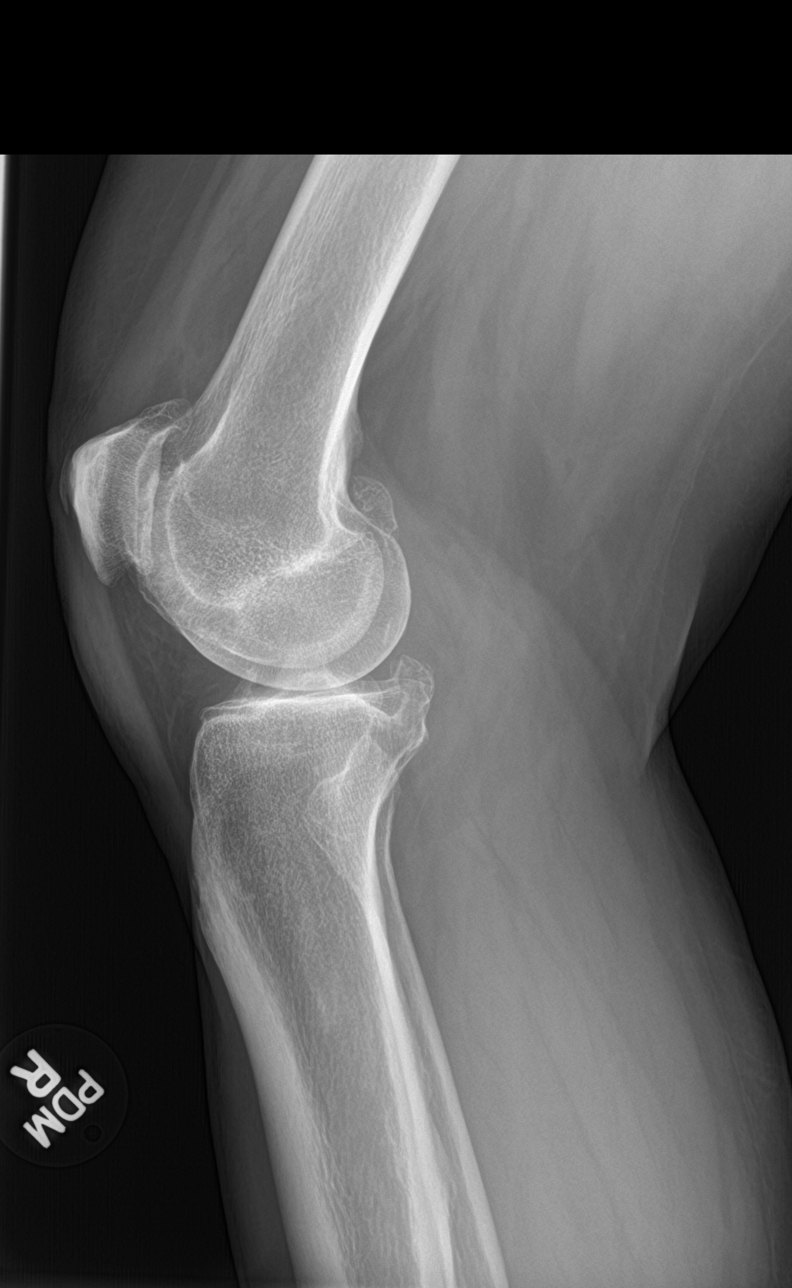

[sunrise]
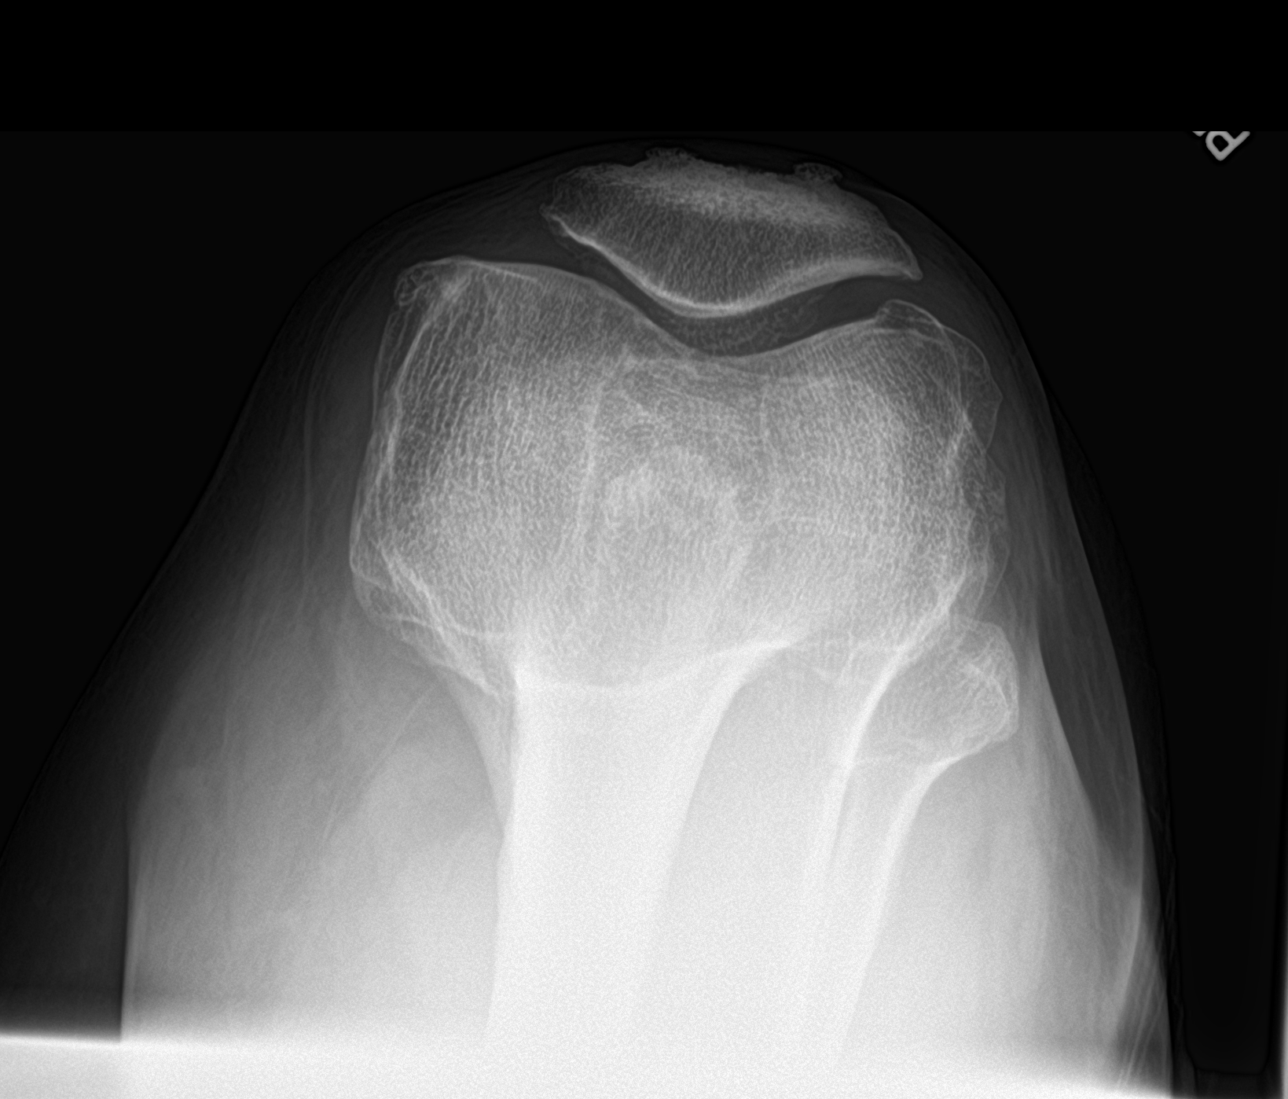

[4 of 4 positions shown; findings below may reference images not displayed]

FINDINGS: Degenerative changes are noted primarily within the medial joint
space with joint space narrowing and osteophytic change. No acute
fracture or dislocation is noted. Degenerative changes in the
patellofemoral joint are noted as well. No soft tissue abnormality
is seen.
IMPRESSION: Degenerative change primarily medially.

## 2018-06-11 ENCOUNTER — Ambulatory Visit: Payer: BLUE CROSS/BLUE SHIELD | Admitting: Internal Medicine

## 2018-07-02 ENCOUNTER — Ambulatory Visit: Payer: BLUE CROSS/BLUE SHIELD | Admitting: Internal Medicine

## 2018-07-02 ENCOUNTER — Ambulatory Visit: Payer: Self-pay | Admitting: Women's Health

## 2018-07-02 DIAGNOSIS — H2513 Age-related nuclear cataract, bilateral: Secondary | ICD-10-CM | POA: Diagnosis not present

## 2018-07-02 DIAGNOSIS — E113293 Type 2 diabetes mellitus with mild nonproliferative diabetic retinopathy without macular edema, bilateral: Secondary | ICD-10-CM | POA: Diagnosis not present

## 2018-07-02 DIAGNOSIS — H5213 Myopia, bilateral: Secondary | ICD-10-CM | POA: Diagnosis not present

## 2018-07-08 LAB — HM DIABETES EYE EXAM

## 2018-07-16 ENCOUNTER — Ambulatory Visit: Payer: Self-pay | Admitting: Gynecology

## 2018-07-16 ENCOUNTER — Other Ambulatory Visit (INDEPENDENT_AMBULATORY_CARE_PROVIDER_SITE_OTHER): Payer: BLUE CROSS/BLUE SHIELD

## 2018-07-16 ENCOUNTER — Ambulatory Visit: Payer: BLUE CROSS/BLUE SHIELD | Admitting: Internal Medicine

## 2018-07-16 ENCOUNTER — Encounter: Payer: Self-pay | Admitting: Internal Medicine

## 2018-07-16 VITALS — BP 156/82 | HR 88 | Temp 98.0°F | Resp 16 | Ht 65.0 in | Wt 196.0 lb

## 2018-07-16 DIAGNOSIS — M8949 Other hypertrophic osteoarthropathy, multiple sites: Secondary | ICD-10-CM | POA: Insufficient documentation

## 2018-07-16 DIAGNOSIS — Z23 Encounter for immunization: Secondary | ICD-10-CM | POA: Diagnosis not present

## 2018-07-16 DIAGNOSIS — I10 Essential (primary) hypertension: Secondary | ICD-10-CM

## 2018-07-16 DIAGNOSIS — E118 Type 2 diabetes mellitus with unspecified complications: Secondary | ICD-10-CM

## 2018-07-16 DIAGNOSIS — R072 Precordial pain: Secondary | ICD-10-CM

## 2018-07-16 DIAGNOSIS — M159 Polyosteoarthritis, unspecified: Secondary | ICD-10-CM

## 2018-07-16 DIAGNOSIS — E785 Hyperlipidemia, unspecified: Secondary | ICD-10-CM

## 2018-07-16 DIAGNOSIS — M150 Primary generalized (osteo)arthritis: Secondary | ICD-10-CM | POA: Diagnosis not present

## 2018-07-16 DIAGNOSIS — B351 Tinea unguium: Secondary | ICD-10-CM

## 2018-07-16 LAB — CBC WITH DIFFERENTIAL/PLATELET
BASOS ABS: 0 10*3/uL (ref 0.0–0.1)
Basophils Relative: 0.8 % (ref 0.0–3.0)
EOS PCT: 0.9 % (ref 0.0–5.0)
Eosinophils Absolute: 0 10*3/uL (ref 0.0–0.7)
HCT: 39.5 % (ref 36.0–46.0)
HEMOGLOBIN: 13.1 g/dL (ref 12.0–15.0)
Lymphocytes Relative: 21.5 % (ref 12.0–46.0)
Lymphs Abs: 0.9 10*3/uL (ref 0.7–4.0)
MCHC: 33.2 g/dL (ref 30.0–36.0)
MCV: 94.4 fl (ref 78.0–100.0)
MONO ABS: 0.4 10*3/uL (ref 0.1–1.0)
MONOS PCT: 9 % (ref 3.0–12.0)
Neutro Abs: 2.8 10*3/uL (ref 1.4–7.7)
Neutrophils Relative %: 67.8 % (ref 43.0–77.0)
Platelets: 182 10*3/uL (ref 150.0–400.0)
RBC: 4.18 Mil/uL (ref 3.87–5.11)
RDW: 12.3 % (ref 11.5–15.5)
WBC: 4.1 10*3/uL (ref 4.0–10.5)

## 2018-07-16 LAB — URINALYSIS, ROUTINE W REFLEX MICROSCOPIC
Bilirubin Urine: NEGATIVE
HGB URINE DIPSTICK: NEGATIVE
Ketones, ur: NEGATIVE
Leukocytes, UA: NEGATIVE
NITRITE: NEGATIVE
RBC / HPF: NONE SEEN (ref 0–?)
Specific Gravity, Urine: 1.015 (ref 1.000–1.030)
Total Protein, Urine: NEGATIVE
Urine Glucose: >=1000 — AB
Urobilinogen, UA: 0.2 (ref 0.0–1.0)
pH: 5.5 (ref 5.0–8.0)

## 2018-07-16 LAB — LIPID PANEL
CHOLESTEROL: 203 mg/dL — AB (ref 0–200)
HDL: 73.2 mg/dL (ref >39.00)
LDL Cholesterol: 118 mg/dL — ABNORMAL HIGH (ref 0–99)
NONHDL: 130.03
Total CHOL/HDL Ratio: 3
Triglycerides: 60 mg/dL (ref 0.0–149.0)
VLDL: 12 mg/dL (ref 0.0–40.0)

## 2018-07-16 LAB — COMPREHENSIVE METABOLIC PANEL
ALBUMIN: 4.2 g/dL (ref 3.5–5.2)
ALK PHOS: 72 U/L (ref 39–117)
ALT: 18 U/L (ref 0–35)
AST: 16 U/L (ref 0–37)
BILIRUBIN TOTAL: 0.5 mg/dL (ref 0.2–1.2)
BUN: 21 mg/dL (ref 6–23)
CO2: 30 mEq/L (ref 19–32)
CREATININE: 0.9 mg/dL (ref 0.40–1.20)
Calcium: 9.7 mg/dL (ref 8.4–10.5)
Chloride: 104 mEq/L (ref 96–112)
GFR: 83.05 mL/min (ref >60.00)
GLUCOSE: 280 mg/dL — AB (ref 70–99)
POTASSIUM: 4.1 meq/L (ref 3.5–5.1)
Sodium: 141 mEq/L (ref 135–145)
TOTAL PROTEIN: 7.6 g/dL (ref 6.0–8.3)

## 2018-07-16 LAB — TSH: TSH: 0.59 u[IU]/mL (ref 0.35–4.50)

## 2018-07-16 LAB — MICROALBUMIN / CREATININE URINE RATIO
CREATININE, U: 97.7 mg/dL
MICROALB/CREAT RATIO: 0.7 mg/g (ref 0.0–30.0)

## 2018-07-16 LAB — HEMOGLOBIN A1C: Hgb A1c MFr Bld: 8.7 % — ABNORMAL HIGH (ref 4.6–6.5)

## 2018-07-16 MED ORDER — ATORVASTATIN CALCIUM 20 MG PO TABS: 20.0000 mg | ORAL_TABLET | Freq: Every day | ORAL | 1 refills | Status: DC

## 2018-07-16 MED ORDER — ASPIRIN EC 81 MG PO TBEC: 81.0000 mg | DELAYED_RELEASE_TABLET | Freq: Every day | ORAL | 1 refills | Status: DC

## 2018-07-16 MED ORDER — NEBIVOLOL HCL 10 MG PO TABS: 10.0000 mg | ORAL_TABLET | Freq: Every day | ORAL | 0 refills | Status: DC

## 2018-07-16 MED ORDER — SITAGLIPTIN PHOS-METFORMIN HCL 50-1000 MG PO TABS: 1.0000 | ORAL_TABLET | Freq: Two times a day (BID) | ORAL | 1 refills | Status: DC

## 2018-07-16 MED ORDER — DICLOFENAC 18 MG PO CAPS: 1.0000 | ORAL_CAPSULE | Freq: Three times a day (TID) | ORAL | 2 refills | Status: DC | PRN

## 2018-07-16 NOTE — Progress Notes (Signed)
Subjective:  Patient ID: Virginia Jimenez, female    DOB: 07-31-1961  Age: 57 y.o. MRN: [id number]  CC: Hypertension; Hyperlipidemia; Diabetes; and Osteoarthritis   HPI Virginia Jimenez presents for f/up - She is compliant with her current antihypertensives but is concerned that her blood pressure is not well controlled.  She denies any recent episodes of headache, blurred vision, chest pain, shortness of breath, palpitations, edema, or fatigue.  She complains of chronic pain in both knees and has been diagnosed with osteoarthritis.  She wants something for the pain.  He is concerned about the appearance of her toenails and wants to continue taking Lamisil to complete a 82-month course.  She is not currently taking any meds for diabetes.  She denies polys.  Outpatient Medications Prior to Visit  Medication Sig Dispense Refill  . Ascorbic Acid (VITAMIN C) 100 MG tablet Take 100 mg by mouth daily.    . chlorthalidone (HYGROTON) 25 MG tablet Take 1 tablet (25 mg total) by mouth daily. 90 tablet 1  . ferrous sulfate 325 (65 FE) MG EC tablet Take 325 mg by mouth 3 (three) times daily with meals.    . irbesartan (AVAPRO) 150 MG tablet Take 1 tablet (150 mg total) by mouth daily. 90 tablet 1  . traMADol (ULTRAM) 50 MG tablet Take 1 tablet (50 mg total) by mouth every 6 (six) hours as needed for severe pain. 75 tablet 2  . Vitamin D, Ergocalciferol, (DRISDOL) 50000 units CAPS capsule Take 1 capsule (50,000 Units total) by mouth every 7 (seven) days. 12 capsule 1  . aspirin EC 81 MG tablet Take 1 tablet (81 mg total) by mouth daily. 90 tablet 3  . atorvastatin (LIPITOR) 40 MG tablet Take 1 tablet (40 mg total) by mouth daily. 90 tablet 3  . Liraglutide -Weight Management (SAXENDA) 18 MG/3ML SOPN Inject 3 mg into the skin daily. 5 pen 5  . metFORMIN (GLUCOPHAGE-XR) 750 MG 24 hr tablet Take 2 tablets (1,500 mg total) by mouth daily with breakfast. 180 tablet 1  . terbinafine (LAMISIL) 250 MG tablet  Take 1 tablet (250 mg total) by mouth daily. 30 tablet 0   No facility-administered medications prior to visit.     ROS Review of Systems  Constitutional: Negative.  Negative for appetite change, diaphoresis, fatigue and unexpected weight change.  HENT: Negative.   Eyes: Negative for visual disturbance.  Respiratory: Negative for cough, chest tightness, shortness of breath and wheezing.   Cardiovascular: Negative for chest pain, palpitations and leg swelling.  Gastrointestinal: Negative for abdominal pain, constipation, diarrhea, nausea and vomiting.  Endocrine: Negative for polydipsia, polyphagia and polyuria.  Genitourinary: Negative.   Musculoskeletal: Positive for arthralgias. Negative for back pain and myalgias.  Skin: Negative.  Negative for color change, pallor and rash.  Neurological: Negative.  Negative for dizziness, weakness, light-headedness and headaches.  Hematological: Negative for adenopathy. Does not bruise/bleed easily.  Psychiatric/Behavioral: Negative.     Objective:  BP (!) 156/82 (BP Location: Left Arm, Patient Position: Sitting, Cuff Size: Large)   Pulse 88   Temp 98 F (36.7 C) (Oral)   Resp 16   Ht 5\' 5"  (1.651 m)   Wt 196 lb (88.9 kg)   LMP 06/01/2012   SpO2 98%   BMI 32.62 kg/m   BP Readings from Last 3 Encounters:  07/16/18 (!) 156/82  04/23/18 (!) 160/90  03/12/18 (!) 158/90    Wt Readings from Last 3 Encounters:  07/16/18 196 lb (88.9 kg)  04/23/18 197 lb (89.4 kg)  03/12/18 198 lb (89.8 kg)    Physical Exam  Constitutional: She is oriented to person, place, and time. No distress.  HENT:  Mouth/Throat: Oropharynx is clear and moist. No oropharyngeal exudate.  Eyes: Conjunctivae are normal. No scleral icterus.  Neck: Normal range of motion. Neck supple. No JVD present. No thyromegaly present.  Cardiovascular: Normal rate and regular rhythm. Exam reveals no gallop.  No murmur heard. Pulmonary/Chest: Effort normal and breath sounds  normal. No respiratory distress. She has no wheezes. She has no rales.  Abdominal: Soft. Bowel sounds are normal. She exhibits no mass. There is no hepatosplenomegaly. There is no tenderness.  Musculoskeletal: Normal range of motion. She exhibits no edema.       Right knee: She exhibits deformity. She exhibits normal range of motion, no swelling, no effusion, no ecchymosis and no bony tenderness. No tenderness found.       Left knee: She exhibits deformity. She exhibits normal range of motion, no swelling, no effusion, no erythema and no bony tenderness. No tenderness found.  Lymphadenopathy:    She has no cervical adenopathy.  Neurological: She is alert and oriented to person, place, and time.  Skin: Skin is warm and dry. She is not diaphoretic. No pallor.  Vitals reviewed.   Lab Results  Component Value Date   WBC 4.1 07/16/2018   HGB 13.1 07/16/2018   HCT 39.5 07/16/2018   PLT 182.0 07/16/2018   GLUCOSE 280 (H) 07/16/2018   CHOL 203 (H) 07/16/2018   TRIG 60.0 07/16/2018   HDL 73.20 07/16/2018   LDLDIRECT 138.6 04/15/2013   LDLCALC 118 (H) 07/16/2018   ALT 18 07/16/2018   AST 16 07/16/2018   NA 141 07/16/2018   K 4.1 07/16/2018   CL 104 07/16/2018   CREATININE 0.90 07/16/2018   BUN 21 07/16/2018   CO2 30 07/16/2018   TSH 0.59 07/16/2018   HGBA1C 8.7 (H) 07/16/2018   MICROALBUR <0.7 07/16/2018    Ct Abdomen Pelvis W Contrast  Result Date: 01/22/2018 CLINICAL DATA:  Generalized abdominal pain with bilateral arm numbness. EXAM: CT ABDOMEN AND PELVIS WITH CONTRAST TECHNIQUE: Multidetector CT imaging of the abdomen and pelvis was performed using the standard protocol following bolus administration of intravenous contrast. CONTRAST:  75 cc Omnipaque 300 COMPARISON:  Radiography yesterday. FINDINGS: Lower chest: Lung bases are clear.  No pleural or pericardial fluid. Hepatobiliary: Diffuse fatty change of the liver. No focal liver lesion. No calcified gallstones. Pancreas: Normal  Spleen: Normal Adrenals/Urinary Tract: Adrenal glands are normal. Kidneys are normal. Bladder is normal. Stomach/Bowel: No abnormality. No sign of obstruction or inflammatory disease. I do not see the appendix, either normal or abnormal. Vascular/Lymphatic: Aortic atherosclerosis, minimal. IVC is normal. No retroperitoneal adenopathy. Reproductive: Normal Other: No free fluid or air. Musculoskeletal: Ordinary lower lumbar degenerative changes. IMPRESSION: Diffuse fatty change of the liver. Otherwise unremarkable scan. No abnormality seen otherwise to explain abdominal pain. Electronically Signed   By: Nelson Chimes M.D.   On: 01/22/2018 17:05    Assessment & Plan:   Carolee was seen today for hypertension, hyperlipidemia, diabetes and osteoarthritis.  Diagnoses and all orders for this visit:  Essential hypertension, benign- Her blood pressure is not adequately well controlled.  I have asked her to add Bystolic to the ARB and thiazide diuretic.  Her labs are negative for secondary causes or endorgan damage. -     Comprehensive metabolic panel; Future -     CBC with Differential/Platelet;  Future -     Urinalysis, Routine w reflex microscopic; Future -     nebivolol (BYSTOLIC) 10 MG tablet; Take 1 tablet (10 mg total) by mouth daily.  Type II diabetes mellitus with manifestations ([place])- Her A1c is at 8.7%.  Her blood sugars are not well controlled due to noncompliance.  I have asked her to start taking metformin and a DPP 4 inhibitor. -     Comprehensive metabolic panel; Future -     Hemoglobin A1c; Future -     Microalbumin / creatinine urine ratio; Future -     sitaGLIPtin-metformin (JANUMET) 50-1000 MG tablet; Take 1 tablet by mouth 2 (two) times daily with a meal.  Hyperlipidemia with target LDL less than 100- She has not achieved her LDL goal.  I have asked her to start taking a statin for CV risk reduction. -     Lipid panel; Future -     Comprehensive metabolic panel; Future -     TSH;  Future -     aspirin EC 81 MG tablet; Take 1 tablet (81 mg total) by mouth daily. -     atorvastatin (LIPITOR) 20 MG tablet; Take 1 tablet (20 mg total) by mouth daily.  Need for influenza vaccination -     Flu Vaccine QUAD 36+ mos IM  Primary osteoarthritis involving multiple joints- She will start taking an NSAID for symptom relief. -     Diclofenac (ZORVOLEX) 18 MG CAPS; Take 1 capsule by mouth 3 (three) times daily with meals as needed.  Precordial chest pain  Onychomycosis of great toe- Her LFTs are normal.  She can complete the course of terbinafine. -     terbinafine (LAMISIL) 250 MG tablet; Take 1 tablet (250 mg total) by mouth daily.   I have discontinued Cleon Gustin. Weitz's atorvastatin, metFORMIN, and Liraglutide -Weight Management. I am also having her start on Diclofenac, nebivolol, sitaGLIPtin-metformin, and atorvastatin. Additionally, I am having her maintain her vitamin C, ferrous sulfate, chlorthalidone, irbesartan, Vitamin D (Ergocalciferol), traMADol, aspirin EC, and terbinafine.  Meds ordered this encounter  Medications  . Diclofenac (ZORVOLEX) 18 MG CAPS    Sig: Take 1 capsule by mouth 3 (three) times daily with meals as needed.    Dispense:  90 capsule    Refill:  2  . nebivolol (BYSTOLIC) 10 MG tablet    Sig: Take 1 tablet (10 mg total) by mouth daily.    Dispense:  84 tablet    Refill:  0  . sitaGLIPtin-metformin (JANUMET) 50-1000 MG tablet    Sig: Take 1 tablet by mouth 2 (two) times daily with a meal.    Dispense:  180 tablet    Refill:  1  . aspirin EC 81 MG tablet    Sig: Take 1 tablet (81 mg total) by mouth daily.    Dispense:  90 tablet    Refill:  1  . atorvastatin (LIPITOR) 20 MG tablet    Sig: Take 1 tablet (20 mg total) by mouth daily.    Dispense:  90 tablet    Refill:  1  . terbinafine (LAMISIL) 250 MG tablet    Sig: Take 1 tablet (250 mg total) by mouth daily.    Dispense:  30 tablet    Refill:  1     Follow-up: Return in about 3  months (around 10/16/2018).  Scarlette Calico, MD

## 2018-07-16 NOTE — Patient Instructions (Signed)

## 2018-07-17 MED ORDER — TERBINAFINE HCL 250 MG PO TABS: 250.0000 mg | ORAL_TABLET | Freq: Every day | ORAL | 1 refills | Status: DC

## 2018-07-29 ENCOUNTER — Telehealth: Payer: Self-pay | Admitting: Internal Medicine

## 2018-07-29 ENCOUNTER — Other Ambulatory Visit: Payer: Self-pay | Admitting: Internal Medicine

## 2018-07-29 DIAGNOSIS — M8949 Other hypertrophic osteoarthropathy, multiple sites: Secondary | ICD-10-CM

## 2018-07-29 DIAGNOSIS — M150 Primary generalized (osteo)arthritis: Principal | ICD-10-CM

## 2018-07-29 DIAGNOSIS — M159 Polyosteoarthritis, unspecified: Secondary | ICD-10-CM

## 2018-07-29 MED ORDER — DICLOFENAC 18 MG PO CAPS: 1.0000 | ORAL_CAPSULE | Freq: Three times a day (TID) | ORAL | 2 refills | Status: DC | PRN

## 2018-07-29 NOTE — Telephone Encounter (Signed)
Copied from [place] [phone]. Topic: Quick Communication - See Telephone Encounter >> Jul 29, 2018 12:30 PM Antonieta Iba C wrote: CRM for notification. See Telephone encounter for: 07/29/18.   Pt called in with 2 concerns.   Pt received a Rx for sitaGLIPtin-metformin (JANUMET) 50-1000 MG tablet, pt says that medication is to expensive Also, pt says that she received samples for Zoverlax (pt isn't sure of name or spelling) she said that it helped but she doesn't have anymore. Pt says that she would like to have a Rx for medication if possible because she is still having inflammation.   Pharmacy: [facility] [address], [place] [phone] (Phone) [phone] (Fax)

## 2018-07-29 NOTE — Telephone Encounter (Signed)
Pt is requesting a refill for Zorvolex. Okay to send?   PA is being done for the Janumet.

## 2018-07-29 NOTE — Telephone Encounter (Signed)
RX sent

## 2018-07-29 NOTE — Telephone Encounter (Signed)
Janumet 50-1000MG   PA initiated via CoverMyMeds. Key: [id number]

## 2018-08-13 ENCOUNTER — Ambulatory Visit: Payer: BLUE CROSS/BLUE SHIELD | Admitting: Women's Health

## 2018-08-13 ENCOUNTER — Encounter: Payer: Self-pay | Admitting: Women's Health

## 2018-08-13 VITALS — BP 138/80 | Ht 65.0 in | Wt 200.0 lb

## 2018-08-13 DIAGNOSIS — Z13820 Encounter for screening for osteoporosis: Secondary | ICD-10-CM

## 2018-08-13 DIAGNOSIS — Z01419 Encounter for gynecological examination (general) (routine) without abnormal findings: Secondary | ICD-10-CM | POA: Diagnosis not present

## 2018-08-13 DIAGNOSIS — N898 Other specified noninflammatory disorders of vagina: Secondary | ICD-10-CM | POA: Diagnosis not present

## 2018-08-13 LAB — WET PREP FOR TRICH, YEAST, CLUE

## 2018-08-13 LAB — HM PAP SMEAR

## 2018-08-13 MED ORDER — METRONIDAZOLE 500 MG PO TABS: 500.0000 mg | ORAL_TABLET | Freq: Two times a day (BID) | ORAL | 0 refills | Status: DC

## 2018-08-13 NOTE — Patient Instructions (Addendum)
Health Maintenance for Postmenopausal Women Menopause is a normal process in which your reproductive ability comes to an end. This process happens gradually over a span of months to years, usually between the ages of 22 and 9. Menopause is complete when you have missed 12 consecutive menstrual periods. It is important to talk with your health care provider about some of the most common conditions that affect postmenopausal women, such as heart disease, cancer, and bone loss (osteoporosis). Adopting a healthy lifestyle and getting preventive care can help to promote your health and wellness. Those actions can also lower your chances of developing some of these common conditions. What should I know about menopause? During menopause, you may experience a number of symptoms, such as:  Moderate-to-severe hot flashes.  Night sweats.  Decrease in sex drive.  Mood swings.  Headaches.  Tiredness.  Irritability.  Memory problems.  Insomnia.  Choosing to treat or not to treat menopausal changes is an individual decision that you make with your health care provider. What should I know about hormone replacement therapy and supplements? Hormone therapy products are effective for treating symptoms that are associated with menopause, such as hot flashes and night sweats. Hormone replacement carries certain risks, especially as you become older. If you are thinking about using estrogen or estrogen with progestin treatments, discuss the benefits and risks with your health care provider. What should I know about heart disease and stroke? Heart disease, heart attack, and stroke become more likely as you age. This may be due, in part, to the hormonal changes that your body experiences during menopause. These can affect how your body processes dietary fats, triglycerides, and cholesterol. Heart attack and stroke are both medical emergencies. There are many things that you can do to help prevent heart disease  and stroke:  Have your blood pressure checked at least every 1-2 years. High blood pressure causes heart disease and increases the risk of stroke.  If you are 53-22 years old, ask your health care provider if you should take aspirin to prevent a heart attack or a stroke.  Do not use any tobacco products, including cigarettes, chewing tobacco, or electronic cigarettes. If you need help quitting, ask your health care provider.  It is important to eat a healthy diet and maintain a healthy weight. ? Be sure to include plenty of vegetables, fruits, low-fat dairy products, and lean protein. ? Avoid eating foods that are high in solid fats, added sugars, or salt (sodium).  Get regular exercise. This is one of the most important things that you can do for your health. ? Try to exercise for at least 150 minutes each week. The type of exercise that you do should increase your heart rate and make you sweat. This is known as moderate-intensity exercise. ? Try to do strengthening exercises at least twice each week. Do these in addition to the moderate-intensity exercise.  Know your numbers.Ask your health care provider to check your cholesterol and your blood glucose. Continue to have your blood tested as directed by your health care provider.  What should I know about cancer screening? There are several types of cancer. Take the following steps to reduce your risk and to catch any cancer development as early as possible. Breast Cancer  Practice breast self-awareness. ? This means understanding how your breasts normally appear and feel. ? It also means doing regular breast self-exams. Let your health care provider know about any changes, no matter how small.  If you are 40  or older, have a clinician do a breast exam (clinical breast exam or CBE) every year. Depending on your age, family history, and medical history, it may be recommended that you also have a yearly breast X-ray (mammogram).  If you  have a family history of breast cancer, talk with your health care provider about genetic screening.  If you are at high risk for breast cancer, talk with your health care provider about having an MRI and a mammogram every year.  Breast cancer (BRCA) gene test is recommended for women who have family members with BRCA-related cancers. Results of the assessment will determine the need for genetic counseling and BRCA1 and for BRCA2 testing. BRCA-related cancers include these types: ? Breast. This occurs in males or females. ? Ovarian. ? Tubal. This may also be called fallopian tube cancer. ? Cancer of the abdominal or pelvic lining (peritoneal cancer). ? Prostate. ? Pancreatic.  Cervical, Uterine, and Ovarian Cancer Your health care provider may recommend that you be screened regularly for cancer of the pelvic organs. These include your ovaries, uterus, and vagina. This screening involves a pelvic exam, which includes checking for microscopic changes to the surface of your cervix (Pap test).  For women ages 21-65, health care providers may recommend a pelvic exam and a Pap test every three years. For women ages 79-65, they may recommend the Pap test and pelvic exam, combined with testing for human papilloma virus (HPV), every five years. Some types of HPV increase your risk of cervical cancer. Testing for HPV may also be done on women of any age who have unclear Pap test results.  Other health care providers may not recommend any screening for nonpregnant women who are considered low risk for pelvic cancer and have no symptoms. Ask your health care provider if a screening pelvic exam is right for you.  If you have had past treatment for cervical cancer or a condition that could lead to cancer, you need Pap tests and screening for cancer for at least 20 years after your treatment. If Pap tests have been discontinued for you, your risk factors (such as having a new sexual partner) need to be  reassessed to determine if you should start having screenings again. Some women have medical problems that increase the chance of getting cervical cancer. In these cases, your health care provider may recommend that you have screening and Pap tests more often.  If you have a family history of uterine cancer or ovarian cancer, talk with your health care provider about genetic screening.  If you have vaginal bleeding after reaching menopause, tell your health care provider.  There are currently no reliable tests available to screen for ovarian cancer.  Lung Cancer Lung cancer screening is recommended for adults 69-62 years old who are at high risk for lung cancer because of a history of smoking. A yearly low-dose CT scan of the lungs is recommended if you:  Currently smoke.  Have a history of at least 30 pack-years of smoking and you currently smoke or have quit within the past 15 years. A pack-year is smoking an average of one pack of cigarettes per day for one year.  Yearly screening should:  Continue until it has been 15 years since you quit.  Stop if you develop a health problem that would prevent you from having lung cancer treatment.  Colorectal Cancer  This type of cancer can be detected and can often be prevented.  Routine colorectal cancer screening usually begins at  age 42 and continues through age 45.  If you have risk factors for colon cancer, your health care provider may recommend that you be screened at an earlier age.  If you have a family history of colorectal cancer, talk with your health care provider about genetic screening.  Your health care provider may also recommend using home test kits to check for hidden blood in your stool.  A small camera at the end of a tube can be used to examine your colon directly (sigmoidoscopy or colonoscopy). This is done to check for the earliest forms of colorectal cancer.  Direct examination of the colon should be repeated every  5-10 years until age 71. However, if early forms of precancerous polyps or small growths are found or if you have a family history or genetic risk for colorectal cancer, you may need to be screened more often.  Skin Cancer  Check your skin from head to toe regularly.  Monitor any moles. Be sure to tell your health care provider: ? About any new moles or changes in moles, especially if there is a change in a mole's shape or color. ? If you have a mole that is larger than the size of a pencil eraser.  If any of your family members has a history of skin cancer, especially at a  age, talk with your health care provider about genetic screening.  Always use sunscreen. Apply sunscreen liberally and repeatedly throughout the day.  Whenever you are outside, protect yourself by wearing long sleeves, pants, a wide-brimmed hat, and sunglasses.  What should I know about osteoporosis? Osteoporosis is a condition in which bone destruction happens more quickly than new bone creation. After menopause, you may be at an increased risk for osteoporosis. To help prevent osteoporosis or the bone fractures that can happen because of osteoporosis, the following is recommended:  If you are 46-71 years old, get at least 1,000 mg of calcium and at least 600 mg of vitamin D per day.  If you are older than age 55 but er than age 65, get at least 1,200 mg of calcium and at least 600 mg of vitamin D per day.  If you are older than age 54, get at least 1,200 mg of calcium and at least 800 mg of vitamin D per day.  Smoking and excessive alcohol intake increase the risk of osteoporosis. Eat foods that are rich in calcium and vitamin D, and do weight-bearing exercises several times each week as directed by your health care provider. What should I know about how menopause affects my mental health? Depression may occur at any age, but it is more common as you become older. Common symptoms of depression  include:  Low or sad mood.  Changes in sleep patterns.  Changes in appetite or eating patterns.  Feeling an overall lack of motivation or enjoyment of activities that you previously enjoyed.  Frequent crying spells.  Talk with your health care provider if you think that you are experiencing depression. What should I know about immunizations? It is important that you get and maintain your immunizations. These include:  Tetanus, diphtheria, and pertussis (Tdap) booster vaccine.  Influenza every year before the flu season begins.  Pneumonia vaccine.  Shingles vaccine.  Your health care provider may also recommend other immunizations. This information is not intended to replace advice given to you by your health care provider. Make sure you discuss any questions you have with your health care provider. Document Released: 10/26/2005  Document Revised: 03/23/2016 Document Reviewed: 06/07/2015 Elsevier Interactive Patient Education  2018 [employer]  Bacterial Vaginosis Bacterial vaginosis is an infection of the vagina. It happens when too many germs (bacteria) grow in the vagina. This infection puts you at risk for infections from sex (STIs). Treating this infection can lower your risk for some STIs. You should also treat this if you are pregnant. It can cause your baby to be born early. Follow these instructions at home: Medicines  Take over-the-counter and prescription medicines only as told by your doctor.  Take or use your antibiotic medicine as told by your doctor. Do not stop taking or using it even if you start to feel better. General instructions  If you your sexual partner is a woman, tell her that you have this infection. She needs to get treatment if she has symptoms. If you have a female partner, he does not need to be treated.  During treatment: ? Avoid sex. ? Do not douche. ? Avoid alcohol as told. ? Avoid breastfeeding as told.  Drink enough fluid to keep your  pee (urine) clear or pale yellow.  Keep your vagina and butt (rectum) clean. ? Wash the area with warm water every day. ? Wipe from front to back after you use the toilet.  Keep all follow-up visits as told by your doctor. This is important. Preventing this condition  Do not douche.  Use only warm water to wash around your vagina.  Use protection when you have sex. This includes: ? Latex condoms. ? Dental dams.  Limit how many people you have sex with. It is best to only have sex with the same person (be monogamous).  Get tested for STIs. Have your partner get tested.  Wear underwear that is cotton or lined with cotton.  Avoid tight pants and pantyhose. This is most important in summer.  Do not use any products that have nicotine or tobacco in them. These include cigarettes and e-cigarettes. If you need help quitting, ask your doctor.  Do not use illegal drugs.  Limit how much alcohol you drink. Contact a doctor if:  Your symptoms do not get better, even after you are treated.  You have more discharge or pain when you pee (urinate).  You have a fever.  You have pain in your belly (abdomen).  You have pain with sex.  Your bleed from your vagina between periods. Summary  This infection happens when too many germs (bacteria) grow in the vagina.  Treating this condition can lower your risk for some infections from sex (STIs).  You should also treat this if you are pregnant. It can cause early (premature) birth.  Do not stop taking or using your antibiotic medicine even if you start to feel better. This information is not intended to replace advice given to you by your health care provider. Make sure you discuss any questions you have with your health care provider. Document Released: 06/12/2008 Document Revised: 05/19/2016 Document Reviewed: 05/19/2016 Elsevier Interactive Patient Education  2017 [employer].

## 2018-08-13 NOTE — Addendum Note (Signed)
Addended by: Lorine Bears on: 08/13/2018 12:18 PM   Modules accepted: Orders
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
## 2018-08-15 LAB — PAP, TP IMAGING W/ HPV RNA, RFLX HPV TYPE 16,18/45: HPV DNA High Risk: NOT DETECTED

## 2018-11-12 ENCOUNTER — Ambulatory Visit: Payer: BLUE CROSS/BLUE SHIELD | Admitting: Internal Medicine

## 2018-12-31 ENCOUNTER — Ambulatory Visit: Payer: BLUE CROSS/BLUE SHIELD | Admitting: Internal Medicine

## 2019-01-13 ENCOUNTER — Ambulatory Visit: Payer: BLUE CROSS/BLUE SHIELD | Admitting: Internal Medicine

## 2019-01-13 ENCOUNTER — Encounter: Payer: Self-pay | Admitting: Internal Medicine

## 2019-01-13 DIAGNOSIS — J020 Streptococcal pharyngitis: Secondary | ICD-10-CM | POA: Insufficient documentation

## 2019-01-13 MED ORDER — AMOXICILLIN 875 MG PO TABS: 875.0000 mg | ORAL_TABLET | Freq: Two times a day (BID) | ORAL | 0 refills | Status: AC

## 2019-01-13 NOTE — Progress Notes (Signed)
Virtual Visit via Video Note  I connected with Virginia Jimenez on 01/13/19 at  9:30 AM EDT by a video enabled telemedicine application and verified that I am speaking with the correct person using two identifiers.   I discussed the limitations of evaluation and management by telemedicine and the availability of in person appointments. The patient expressed understanding and agreed to proceed.  History of Present Illness: She checked in for virtual visit.  She was not willing to come in for an in person visit due to the COVID-19 pandemic.  She complains of a 5-day history of sore throat, fatigue, and lymphadenopathy in the front of her neck.  She complains of odynophagia but denies dysphagia.  She is taking ibuprofen for symptom relief.  She denies abd pain, N/V, cough, fever, chills, or rash.    Observations/Objective: She was in no acute distress.  She showed no trismus or stridor.  She was not able to check a temperature.  She was calm, cooperative, and appropriate.  Her speech was normal.  Lab Results  Component Value Date   WBC 4.1 07/16/2018   HGB 13.1 07/16/2018   HCT 39.5 07/16/2018   PLT 182.0 07/16/2018   GLUCOSE 280 (H) 07/16/2018   CHOL 203 (H) 07/16/2018   TRIG 60.0 07/16/2018   HDL 73.20 07/16/2018   LDLDIRECT 138.6 04/15/2013   LDLCALC 118 (H) 07/16/2018   ALT 18 07/16/2018   AST 16 07/16/2018   NA 141 07/16/2018   K 4.1 07/16/2018   CL 104 07/16/2018   CREATININE 0.90 07/16/2018   BUN 21 07/16/2018   CO2 30 07/16/2018   TSH 0.59 07/16/2018   HGBA1C 8.7 (H) 07/16/2018   MICROALBUR <0.7 07/16/2018     Assessment and Plan: I was not able to test her for strep because she would not come in for an in person visit.  Her symptoms are suspicious for strep so I have that she take a 10-day course of amoxicillin.  She will continue to control the pain with a combination of Tylenol and Advil.   Follow Up Instructions: She will let me know if she develops any new or  worsening symptoms.  She agrees to comply with the antibiotic regimen.    I discussed the assessment and treatment plan with the patient. The patient was provided an opportunity to ask questions and all were answered. The patient agreed with the plan and demonstrated an understanding of the instructions.   The patient was advised to call back or seek an in-person evaluation if the symptoms worsen or if the condition fails to improve as anticipated.  I provided 25 minutes of non-face-to-face time during this encounter.   Scarlette Calico, MD

## 2019-01-15 ENCOUNTER — Telehealth: Payer: Self-pay

## 2019-01-15 NOTE — Telephone Encounter (Signed)
Lvm for pt informing that letter was complete and she may pick up at her convenience.

## 2019-01-15 NOTE — Telephone Encounter (Signed)
Copied from [place] [id number]. Topic: General - Other >> Jan 14, 2019  2:12 PM Leward Quan A wrote: Reason for CRM: Patient called to say that she saw Dr Ronnald Ramp on 01/13/2019 and been out of work for one week but want to go back on 01/16/2019 and need a note to return. She would like to know if she can pick it up or can it be emailed to her. Please call patient at Ph# [phone]

## 2019-02-25 ENCOUNTER — Ambulatory Visit: Payer: BLUE CROSS/BLUE SHIELD | Admitting: Internal Medicine

## 2019-04-01 ENCOUNTER — Other Ambulatory Visit (INDEPENDENT_AMBULATORY_CARE_PROVIDER_SITE_OTHER): Payer: BC Managed Care – PPO

## 2019-04-01 ENCOUNTER — Telehealth: Payer: Self-pay | Admitting: Internal Medicine

## 2019-04-01 ENCOUNTER — Encounter: Payer: Self-pay | Admitting: Internal Medicine

## 2019-04-01 ENCOUNTER — Other Ambulatory Visit: Payer: Self-pay

## 2019-04-01 ENCOUNTER — Ambulatory Visit (INDEPENDENT_AMBULATORY_CARE_PROVIDER_SITE_OTHER): Payer: BC Managed Care – PPO | Admitting: Internal Medicine

## 2019-04-01 VITALS — BP 166/84 | HR 73 | Temp 98.2°F | Ht 65.0 in | Wt 206.0 lb

## 2019-04-01 DIAGNOSIS — R220 Localized swelling, mass and lump, head: Secondary | ICD-10-CM

## 2019-04-01 DIAGNOSIS — Z0000 Encounter for general adult medical examination without abnormal findings: Secondary | ICD-10-CM | POA: Diagnosis not present

## 2019-04-01 DIAGNOSIS — M278 Other specified diseases of jaws: Secondary | ICD-10-CM

## 2019-04-01 DIAGNOSIS — E785 Hyperlipidemia, unspecified: Secondary | ICD-10-CM

## 2019-04-01 DIAGNOSIS — Z1231 Encounter for screening mammogram for malignant neoplasm of breast: Secondary | ICD-10-CM

## 2019-04-01 DIAGNOSIS — K219 Gastro-esophageal reflux disease without esophagitis: Secondary | ICD-10-CM

## 2019-04-01 DIAGNOSIS — E118 Type 2 diabetes mellitus with unspecified complications: Secondary | ICD-10-CM

## 2019-04-01 DIAGNOSIS — Z1159 Encounter for screening for other viral diseases: Secondary | ICD-10-CM | POA: Diagnosis not present

## 2019-04-01 DIAGNOSIS — I10 Essential (primary) hypertension: Secondary | ICD-10-CM

## 2019-04-01 DIAGNOSIS — E559 Vitamin D deficiency, unspecified: Secondary | ICD-10-CM | POA: Diagnosis not present

## 2019-04-01 DIAGNOSIS — M170 Bilateral primary osteoarthritis of knee: Secondary | ICD-10-CM

## 2019-04-01 LAB — CBC WITH DIFFERENTIAL/PLATELET
Basophils Absolute: 0 10*3/uL (ref 0.0–0.1)
Basophils Relative: 0.7 % (ref 0.0–3.0)
Eosinophils Absolute: 0.1 10*3/uL (ref 0.0–0.7)
Eosinophils Relative: 2.2 % (ref 0.0–5.0)
HCT: 39 % (ref 36.0–46.0)
Hemoglobin: 12.9 g/dL (ref 12.0–15.0)
Lymphocytes Relative: 25.3 % (ref 12.0–46.0)
Lymphs Abs: 0.9 10*3/uL (ref 0.7–4.0)
MCHC: 33.2 g/dL (ref 30.0–36.0)
MCV: 94.5 fl (ref 78.0–100.0)
Monocytes Absolute: 0.3 10*3/uL (ref 0.1–1.0)
Monocytes Relative: 8.9 % (ref 3.0–12.0)
Neutro Abs: 2.2 10*3/uL (ref 1.4–7.7)
Neutrophils Relative %: 62.9 % (ref 43.0–77.0)
Platelets: 210 10*3/uL (ref 150.0–400.0)
RBC: 4.12 Mil/uL (ref 3.87–5.11)
RDW: 12.7 % (ref 11.5–15.5)
WBC: 3.5 10*3/uL — ABNORMAL LOW (ref 4.0–10.5)

## 2019-04-01 LAB — HEPATIC FUNCTION PANEL
ALT: 18 U/L (ref 0–35)
AST: 14 U/L (ref 0–37)
Albumin: 4.1 g/dL (ref 3.5–5.2)
Alkaline Phosphatase: 89 U/L (ref 39–117)
Bilirubin, Direct: 0.1 mg/dL (ref 0.0–0.3)
Total Bilirubin: 0.5 mg/dL (ref 0.2–1.2)
Total Protein: 7.8 g/dL (ref 6.0–8.3)

## 2019-04-01 LAB — LIPID PANEL
Cholesterol: 212 mg/dL — ABNORMAL HIGH (ref 0–200)
HDL: 73.3 mg/dL (ref >39.00)
LDL Cholesterol: 128 mg/dL — ABNORMAL HIGH (ref 0–99)
NonHDL: 138.69
Total CHOL/HDL Ratio: 3
Triglycerides: 52 mg/dL (ref 0.0–149.0)
VLDL: 10.4 mg/dL (ref 0.0–40.0)

## 2019-04-01 LAB — BASIC METABOLIC PANEL
BUN: 19 mg/dL (ref 6–23)
CO2: 29 mEq/L (ref 19–32)
Calcium: 9.5 mg/dL (ref 8.4–10.5)
Chloride: 102 mEq/L (ref 96–112)
Creatinine, Ser: 0.78 mg/dL (ref 0.40–1.20)
GFR: 91.94 mL/min (ref >60.00)
Glucose, Bld: 297 mg/dL — ABNORMAL HIGH (ref 70–99)
Potassium: 4.2 mEq/L (ref 3.5–5.1)
Sodium: 139 mEq/L (ref 135–145)

## 2019-04-01 LAB — HEMOGLOBIN A1C: Hgb A1c MFr Bld: 10.6 % — ABNORMAL HIGH (ref 4.6–6.5)

## 2019-04-01 LAB — VITAMIN D 25 HYDROXY (VIT D DEFICIENCY, FRACTURES): VITD: 34 ng/mL (ref 30.00–100.00)

## 2019-04-01 MED ORDER — ROSUVASTATIN CALCIUM 20 MG PO TABS: 20.0000 mg | ORAL_TABLET | Freq: Every day | ORAL | 1 refills | Status: DC

## 2019-04-01 MED ORDER — CHLORTHALIDONE 25 MG PO TABS: 25.0000 mg | ORAL_TABLET | Freq: Every day | ORAL | 0 refills | Status: DC

## 2019-04-01 MED ORDER — SOLIQUA 100-33 UNT-MCG/ML ~~LOC~~ SOPN: 18.0000 [IU] | PEN_INJECTOR | Freq: Every day | SUBCUTANEOUS | 0 refills | Status: DC

## 2019-04-01 MED ORDER — NEBIVOLOL HCL 10 MG PO TABS: 10.0000 mg | ORAL_TABLET | Freq: Every day | ORAL | 0 refills | Status: DC

## 2019-04-01 MED ORDER — IRBESARTAN 150 MG PO TABS: 150.0000 mg | ORAL_TABLET | Freq: Every day | ORAL | 0 refills | Status: DC

## 2019-04-01 MED ORDER — TRAMADOL HCL 50 MG PO TABS: 50.0000 mg | ORAL_TABLET | Freq: Four times a day (QID) | ORAL | 2 refills | Status: DC | PRN

## 2019-04-01 MED ORDER — METFORMIN HCL ER 750 MG PO TB24: 1500.0000 mg | ORAL_TABLET | Freq: Every day | ORAL | 1 refills | Status: DC

## 2019-04-01 NOTE — Telephone Encounter (Signed)
Result note read to patient; verbalizes understanding. Agreeable to [place] education. Please call pt to set up appt.  CB# [phone] [place] encounter as result note was not routed to [facility].

## 2019-04-01 NOTE — Patient Instructions (Signed)

## 2019-04-01 NOTE — Progress Notes (Signed)
Subjective:  Patient ID: Virginia Jimenez, female    DOB: 1961-04-06  Age: 58 y.o. MRN: [id number]  CC: Annual Exam, Hypertension, Diabetes, and Hyperlipidemia   HPI Virginia Jimenez presents for a CPX.  She complains of a 2-week history of painless lump in her right jaw adjacent and just below the ear.  She complains that her blood pressure and blood sugars have been well controlled.  She has not been consistently taking any meds to treat this.  She complains of weight gain and tells me she has not been working on her lifestyle modifications.  She complains of chronic, bilateral knee pain.  She is taking tramadol, Tylenol and Aleve to control the pain.  Outpatient Medications Prior to Visit  Medication Sig Dispense Refill  . Ascorbic Acid (VITAMIN C) 100 MG tablet Take 100 mg by mouth daily.    Marland Kitchen aspirin EC 81 MG tablet Take 1 tablet (81 mg total) by mouth daily. 90 tablet 1  . ferrous sulfate 325 (65 FE) MG EC tablet Take 325 mg by mouth 3 (three) times daily with meals.    . Vitamin D, Ergocalciferol, (DRISDOL) 50000 units CAPS capsule Take 1 capsule (50,000 Units total) by mouth every 7 (seven) days. 12 capsule 1  . atorvastatin (LIPITOR) 20 MG tablet Take 1 tablet (20 mg total) by mouth daily. 90 tablet 1  . chlorthalidone (HYGROTON) 25 MG tablet Take 1 tablet (25 mg total) by mouth daily. 90 tablet 1  . irbesartan (AVAPRO) 150 MG tablet Take 1 tablet (150 mg total) by mouth daily. 90 tablet 1  . nebivolol (BYSTOLIC) 10 MG tablet Take 1 tablet (10 mg total) by mouth daily. 84 tablet 0  . sitaGLIPtin-metformin (JANUMET) 50-1000 MG tablet Take 1 tablet by mouth 2 (two) times daily with a meal. 180 tablet 1  . traMADol (ULTRAM) 50 MG tablet Take 1 tablet (50 mg total) by mouth every 6 (six) hours as needed for severe pain. 75 tablet 2  . Diclofenac (ZORVOLEX) 18 MG CAPS Take 1 capsule by mouth 3 (three) times daily with meals as needed. (Patient not taking: Reported on 04/01/2019) 90  capsule 2  . metroNIDAZOLE (FLAGYL) 500 MG tablet Take 1 tablet (500 mg total) by mouth 2 (two) times daily. (Patient not taking: Reported on 04/01/2019) 14 tablet 0  . terbinafine (LAMISIL) 250 MG tablet Take 1 tablet (250 mg total) by mouth daily. (Patient not taking: Reported on 04/01/2019) 30 tablet 1   No facility-administered medications prior to visit.     ROS Review of Systems  Constitutional: Positive for unexpected weight change (wt gain). Negative for diaphoresis and fatigue.  HENT: Negative.  Negative for nosebleeds, sore throat, trouble swallowing and voice change.   Eyes: Negative for visual disturbance.  Respiratory: Negative for cough, chest tightness, shortness of breath and wheezing.   Cardiovascular: Negative for chest pain, palpitations and leg swelling.  Gastrointestinal: Negative for abdominal pain, constipation, diarrhea, nausea and vomiting.  Endocrine: Positive for polydipsia, polyphagia and polyuria.  Genitourinary: Negative.  Negative for difficulty urinating, dysuria and urgency.  Musculoskeletal: Positive for arthralgias. Negative for myalgias.  Skin: Negative.  Negative for color change and rash.  Neurological: Negative.  Negative for dizziness, weakness and light-headedness.  Hematological: Negative for adenopathy. Does not bruise/bleed easily.  Psychiatric/Behavioral: Negative.     Objective:  BP (!) 166/84 (BP Location: Left Arm, Patient Position: Sitting, Cuff Size: Large) Comment: BP (R) 166/84 (L) 164/82  Pulse 73   Temp  98.2 F (36.8 C) (Oral)   Ht 5\' 5"  (1.651 m)   Wt 206 lb (93.4 kg)   LMP 06/01/2012   SpO2 99%   BMI 34.28 kg/m   BP Readings from Last 3 Encounters:  04/01/19 (!) 166/84  08/13/18 138/80  07/16/18 (!) 156/82    Wt Readings from Last 3 Encounters:  04/01/19 206 lb (93.4 kg)  08/13/18 200 lb (90.7 kg)  07/16/18 196 lb (88.9 kg)    Physical Exam Vitals signs reviewed.  Constitutional:      General: She is not in acute  distress.    Appearance: She is obese. She is not ill-appearing, toxic-appearing or diaphoretic.  HENT:     Head:      Mouth/Throat:     Mouth: Mucous membranes are moist.  Eyes:     General: No scleral icterus.    Conjunctiva/sclera: Conjunctivae normal.  Neck:     Musculoskeletal: Normal range of motion. No neck rigidity or muscular tenderness.  Cardiovascular:     Rate and Rhythm: Normal rate and regular rhythm.     Heart sounds: No murmur.  Pulmonary:     Effort: Pulmonary effort is normal.     Breath sounds: No stridor. No wheezing, rhonchi or rales.  Abdominal:     General: Abdomen is protuberant. Bowel sounds are normal. There is no distension.     Palpations: There is no hepatomegaly or splenomegaly.     Tenderness: There is no abdominal tenderness.     Hernia: No hernia is present.  Musculoskeletal: Normal range of motion.        General: No swelling.     Right lower leg: No edema.     Left lower leg: No edema.  Lymphadenopathy:     Cervical: No cervical adenopathy.  Skin:    General: Skin is warm and dry.     Coloration: Skin is not pale.     Findings: No rash.  Neurological:     General: No focal deficit present.     Mental Status: She is alert and oriented to person, place, and time. Mental status is at baseline.  Psychiatric:        Mood and Affect: Mood normal.        Behavior: Behavior normal.     Lab Results  Component Value Date   WBC 3.5 (L) 04/01/2019   HGB 12.9 04/01/2019   HCT 39.0 04/01/2019   PLT 210.0 04/01/2019   GLUCOSE 297 (H) 04/01/2019   CHOL 212 (H) 04/01/2019   TRIG 52.0 04/01/2019   HDL 73.30 04/01/2019   LDLDIRECT 138.6 04/15/2013   LDLCALC 128 (H) 04/01/2019   ALT 18 04/01/2019   AST 14 04/01/2019   NA 139 04/01/2019   K 4.2 04/01/2019   CL 102 04/01/2019   CREATININE 0.78 04/01/2019   BUN 19 04/01/2019   CO2 29 04/01/2019   TSH 0.59 07/16/2018   HGBA1C 10.6 (H) 04/01/2019   MICROALBUR <0.7 07/16/2018    Ct Abdomen  Pelvis W Contrast  Result Date: 01/22/2018 CLINICAL DATA:  Generalized abdominal pain with bilateral arm numbness. EXAM: CT ABDOMEN AND PELVIS WITH CONTRAST TECHNIQUE: Multidetector CT imaging of the abdomen and pelvis was performed using the standard protocol following bolus administration of intravenous contrast. CONTRAST:  75 cc Omnipaque 300 COMPARISON:  Radiography yesterday. FINDINGS: Lower chest: Lung bases are clear.  No pleural or pericardial fluid. Hepatobiliary: Diffuse fatty change of the liver. No focal liver lesion. No calcified gallstones. Pancreas:  Normal Spleen: Normal Adrenals/Urinary Tract: Adrenal glands are normal. Kidneys are normal. Bladder is normal. Stomach/Bowel: No abnormality. No sign of obstruction or inflammatory disease. I do not see the appendix, either normal or abnormal. Vascular/Lymphatic: Aortic atherosclerosis, minimal. IVC is normal. No retroperitoneal adenopathy. Reproductive: Normal Other: No free fluid or air. Musculoskeletal: Ordinary lower lumbar degenerative changes. IMPRESSION: Diffuse fatty change of the liver. Otherwise unremarkable scan. No abnormality seen otherwise to explain abdominal pain. Electronically Signed   By: Nelson Chimes M.D.   On: 01/22/2018 17:05    Assessment & Plan:   Virginia Jimenez was seen today for annual exam, hypertension, diabetes and hyperlipidemia.  Diagnoses and all orders for this visit:  Essential hypertension, benign- Her blood sugar is not adequately well controlled.  I have asked her to start taking irbesartan, nebivolol, and chlorthalidone. -     Basic metabolic panel; Future -     irbesartan (AVAPRO) 150 MG tablet; Take 1 tablet (150 mg total) by mouth daily. -     nebivolol (BYSTOLIC) 10 MG tablet; Take 1 tablet (10 mg total) by mouth daily. -     chlorthalidone (HYGROTON) 25 MG tablet; Take 1 tablet (25 mg total) by mouth daily.  Type II diabetes mellitus with manifestations ([place])- Her A1c is up to 10.6%.  I have asked her to  restart metformin and to start using a basal insulin and a GLP-1 agonist. -     Basic metabolic panel; Future -     Hemoglobin A1c; Future -     irbesartan (AVAPRO) 150 MG tablet; Take 1 tablet (150 mg total) by mouth daily. -     metFORMIN (GLUCOPHAGE-XR) 750 MG 24 hr tablet; Take 2 tablets (1,500 mg total) by mouth daily with breakfast. -     Amb Referral to Nutrition and Diabetic E -     Ambulatory referral to Endocrinology -     Insulin Glargine-Lixisenatide (SOLIQUA) 100-33 UNT-MCG/ML SOPN; Inject 18 Units into the skin daily.  Hyperlipidemia with target LDL less than 100- She has not achieved her LDL goal.  I have asked her to start taking a statin for CV risk reduction. -     Hepatic function panel; Future -     rosuvastatin (CRESTOR) 20 MG tablet; Take 1 tablet (20 mg total) by mouth daily.  Vitamin D deficiency disease -     VITAMIN D 25 Hydroxy (Vit-D Deficiency, Fractures); Future  Gastroesophageal reflux disease without esophagitis -     CBC with Differential/Platelet; Future  Routine general medical examination at a health care facility- Exam completed, labs reviewed, vaccines reviewed, screening for colon cancer is up-to-date, screening mammogram ordered.  Pap is up-to-date. -     Lipid panel; Future -     HIV Antibody (routine testing w rflx); Future  Need for hepatitis C screening test -     Hepatitis C antibody; Future  Primary osteoarthritis of both knees -     traMADol (ULTRAM) 50 MG tablet; Take 1 tablet (50 mg total) by mouth every 6 (six) hours as needed for severe pain.  Visit for screening mammogram -     MM DIGITAL SCREENING BILATERAL; Future  Jaw mass- I have asked her to undergo a CT with contrast to see if there is concern for parotid gland cancer, lymphoma, infection, or cystic lesion.  Localized swelling, mass, and lump of head- See above -     CT Maxillofacial W/Cm; Future  Type 2 diabetes mellitus with complication, without long-term current  use of  insulin ([facility]) -     irbesartan (AVAPRO) 150 MG tablet; Take 1 tablet (150 mg total) by mouth daily.   I have discontinued Cleon Gustin. Tilson's sitaGLIPtin-metformin, atorvastatin, terbinafine, Diclofenac, and metroNIDAZOLE. I am also having her start on rosuvastatin, metFORMIN, and Soliqua. Additionally, I am having her maintain her vitamin C, ferrous sulfate, Vitamin D (Ergocalciferol), aspirin EC, traMADol, irbesartan, nebivolol, and chlorthalidone.  Meds ordered this encounter  Medications  . traMADol (ULTRAM) 50 MG tablet    Sig: Take 1 tablet (50 mg total) by mouth every 6 (six) hours as needed for severe pain.    Dispense:  75 tablet    Refill:  2  . irbesartan (AVAPRO) 150 MG tablet    Sig: Take 1 tablet (150 mg total) by mouth daily.    Dispense:  30 tablet    Refill:  0  . nebivolol (BYSTOLIC) 10 MG tablet    Sig: Take 1 tablet (10 mg total) by mouth daily.    Dispense:  30 tablet    Refill:  0  . chlorthalidone (HYGROTON) 25 MG tablet    Sig: Take 1 tablet (25 mg total) by mouth daily.    Dispense:  30 tablet    Refill:  0  . rosuvastatin (CRESTOR) 20 MG tablet    Sig: Take 1 tablet (20 mg total) by mouth daily.    Dispense:  90 tablet    Refill:  1  . metFORMIN (GLUCOPHAGE-XR) 750 MG 24 hr tablet    Sig: Take 2 tablets (1,500 mg total) by mouth daily with breakfast.    Dispense:  180 tablet    Refill:  1  . Insulin Glargine-Lixisenatide (SOLIQUA) 100-33 UNT-MCG/ML SOPN    Sig: Inject 18 Units into the skin daily.    Dispense:  3 mL    Refill:  0     Follow-up: Return in about 3 weeks (around 04/22/2019).  Scarlette Calico, MD

## 2019-04-02 ENCOUNTER — Encounter: Payer: Self-pay | Admitting: Internal Medicine

## 2019-04-02 LAB — HIV ANTIBODY (ROUTINE TESTING W REFLEX): HIV 1&2 Ab, 4th Generation: NONREACTIVE

## 2019-04-02 LAB — HEPATITIS C ANTIBODY
Hepatitis C Ab: NONREACTIVE
SIGNAL TO CUT-OFF: 0.05 (ref <1.00)

## 2019-04-06 ENCOUNTER — Other Ambulatory Visit: Payer: Self-pay | Admitting: Internal Medicine

## 2019-04-06 DIAGNOSIS — I10 Essential (primary) hypertension: Secondary | ICD-10-CM

## 2019-04-06 MED ORDER — CARVEDILOL 3.125 MG PO TABS: 3.1250 mg | ORAL_TABLET | Freq: Two times a day (BID) | ORAL | 0 refills | Status: DC

## 2019-04-17 ENCOUNTER — Inpatient Hospital Stay: Admission: RE | Admit: 2019-04-17 | Payer: BC Managed Care – PPO | Source: Ambulatory Visit

## 2019-04-21 ENCOUNTER — Other Ambulatory Visit: Payer: Self-pay | Admitting: Internal Medicine

## 2019-04-21 DIAGNOSIS — I10 Essential (primary) hypertension: Secondary | ICD-10-CM

## 2019-04-22 ENCOUNTER — Ambulatory Visit: Payer: Self-pay | Admitting: Family Medicine

## 2019-05-12 ENCOUNTER — Other Ambulatory Visit: Payer: Self-pay | Admitting: Internal Medicine

## 2019-05-12 DIAGNOSIS — E118 Type 2 diabetes mellitus with unspecified complications: Secondary | ICD-10-CM

## 2019-05-12 DIAGNOSIS — I10 Essential (primary) hypertension: Secondary | ICD-10-CM

## 2019-05-22 ENCOUNTER — Ambulatory Visit: Payer: Self-pay | Admitting: Family Medicine

## 2019-05-27 ENCOUNTER — Ambulatory Visit: Payer: BC Managed Care – PPO

## 2019-06-09 ENCOUNTER — Encounter: Payer: Self-pay | Admitting: Internal Medicine

## 2019-06-16 ENCOUNTER — Encounter: Payer: Self-pay | Admitting: Gynecology

## 2019-07-01 IMAGING — CT CT ABD-PELV W/ CM
2 of 5 series · 17 of 46 positions shown, 19 images · IV contrast (Omni 300)
Comparison: Radiography yesterday.

CLINICAL DATA: Generalized abdominal pain with bilateral arm
numbness.

EXAM:
CT ABDOMEN AND PELVIS WITH CONTRAST
TECHNIQUE: Multidetector CT imaging of the abdomen and pelvis was performed
using the standard protocol following bolus administration of
intravenous contrast.
CONTRAST:  75 cc Omnipaque 300

[Series 3: a/p w/ 5mm · axial · 0.77mm/px · z∈[+863,+1238]mm · 14 of 87 slices shown, 16 images]
[im 6/87  soft-tissue]
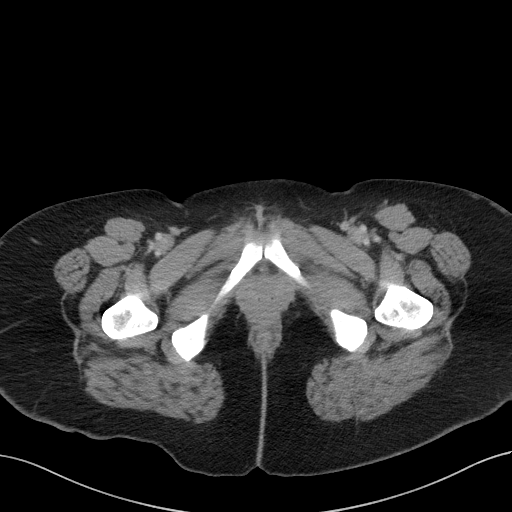
[im 6/87  bone]
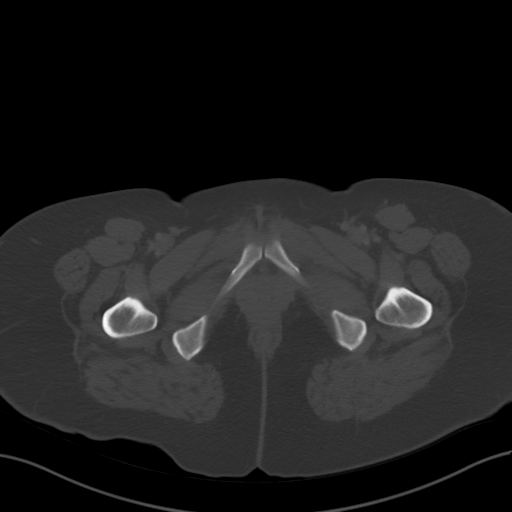
[im 11/87  soft-tissue]
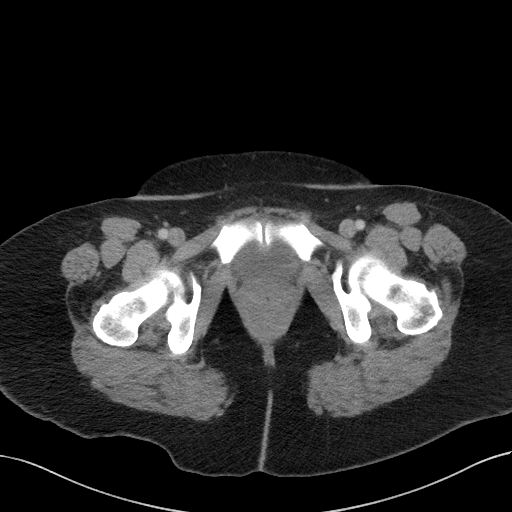
[im 16/87  soft-tissue]
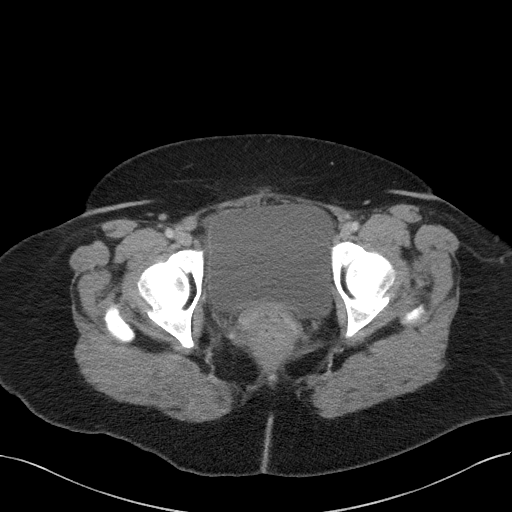
[im 26/87  soft-tissue]
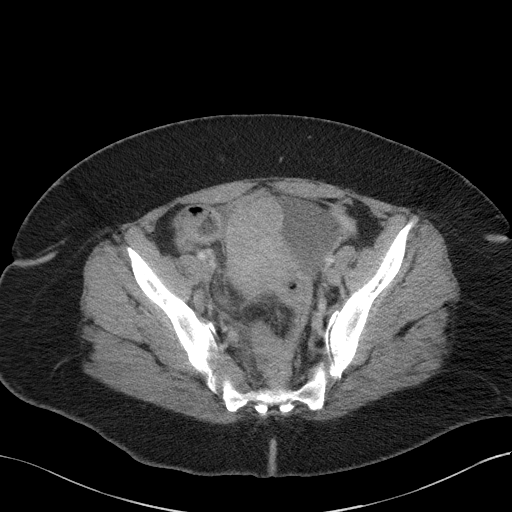
[im 31/87  soft-tissue]
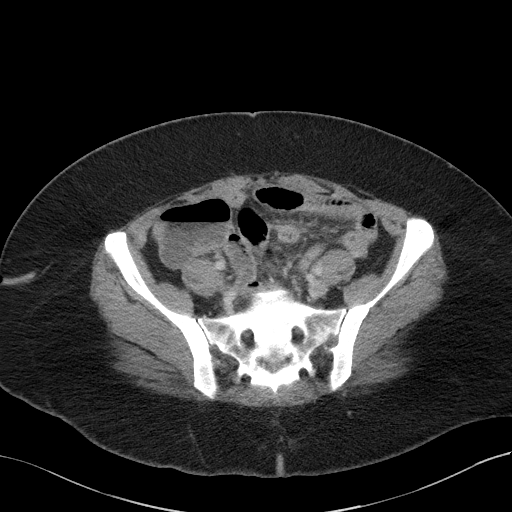
[im 36/87  soft-tissue]
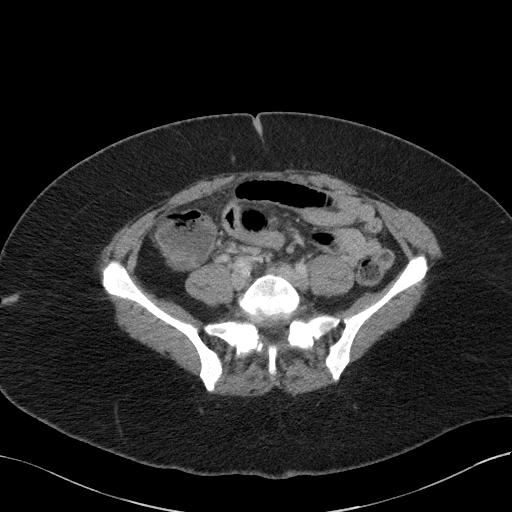
[im 41/87  soft-tissue]
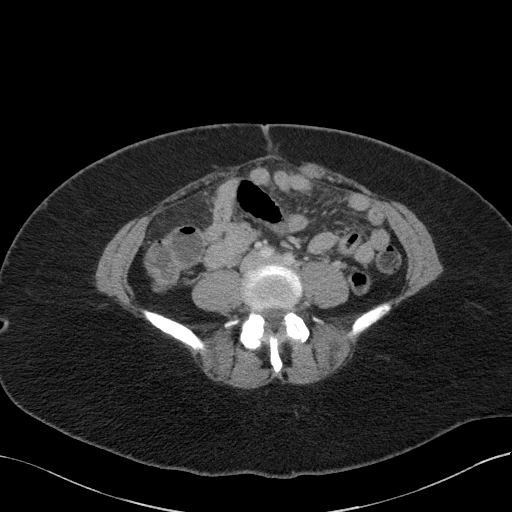
[im 46/87  soft-tissue]
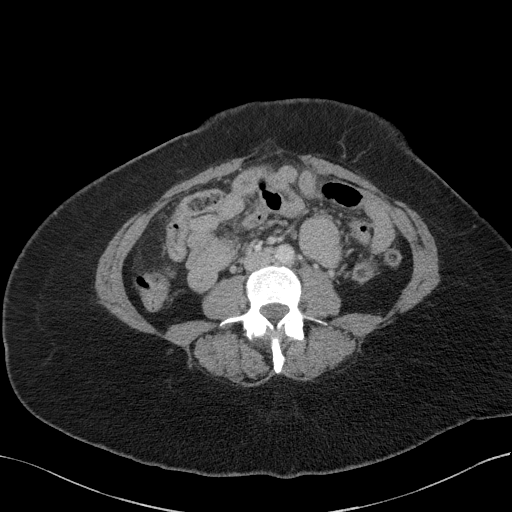
[im 51/87  soft-tissue]
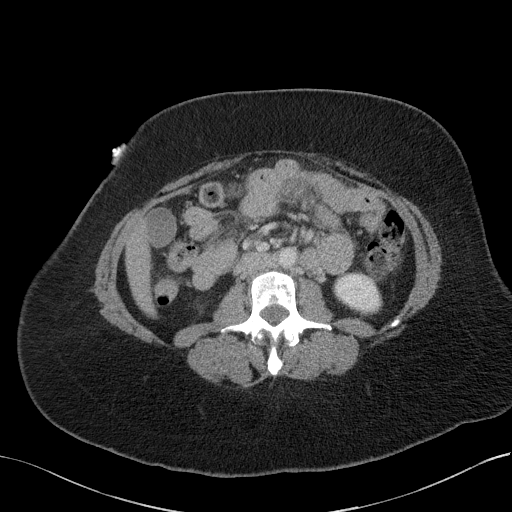
[im 51/87  bone]
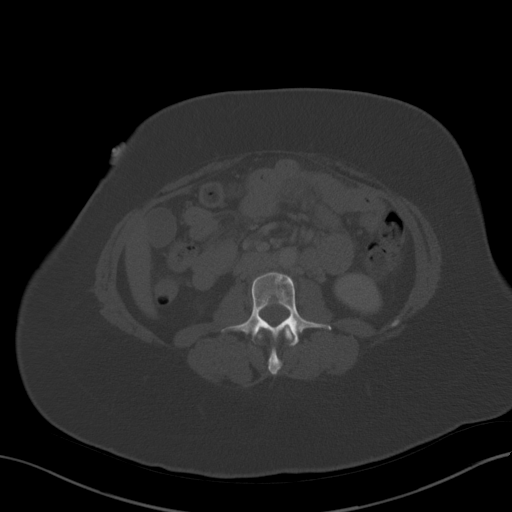
[im 56/87  soft-tissue]
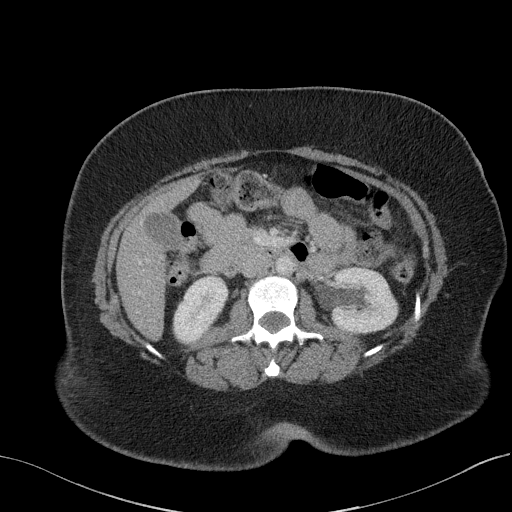
[im 66/87  soft-tissue]
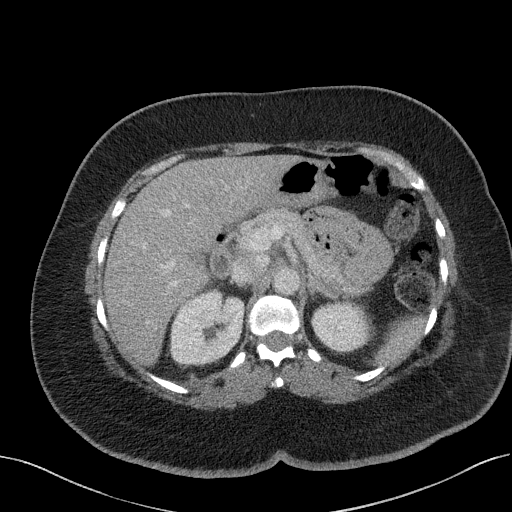
[im 71/87  soft-tissue]
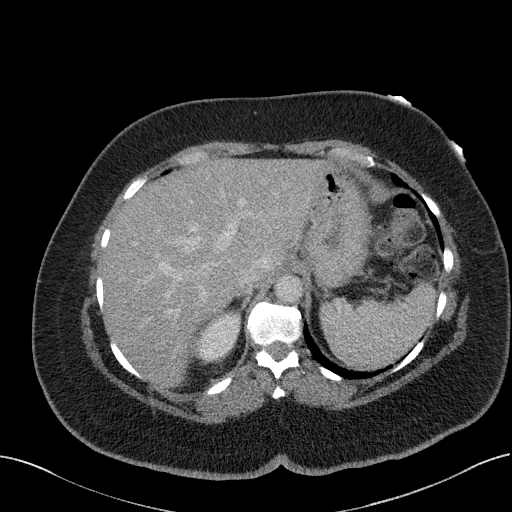
[im 76/87  soft-tissue]
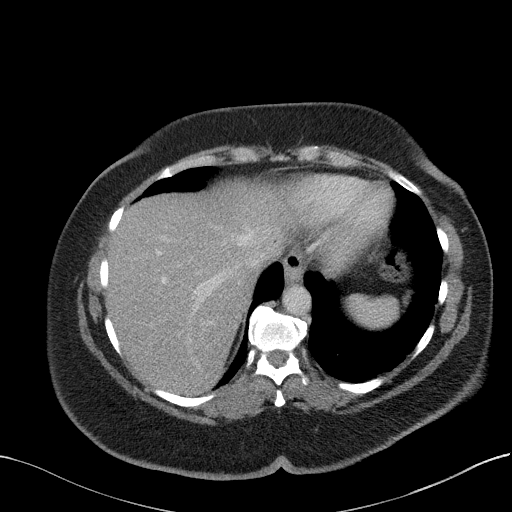
[im 81/87  soft-tissue]
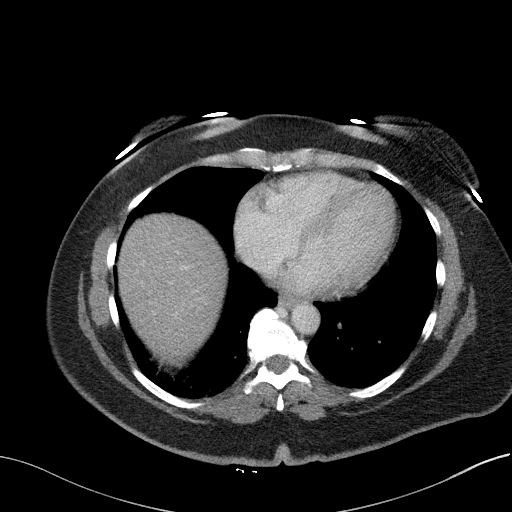

[Series 6: a/p w/ cor · coronal · 0.76mm/px · 3 of 151 slices shown]
[im 51/151  soft-tissue]
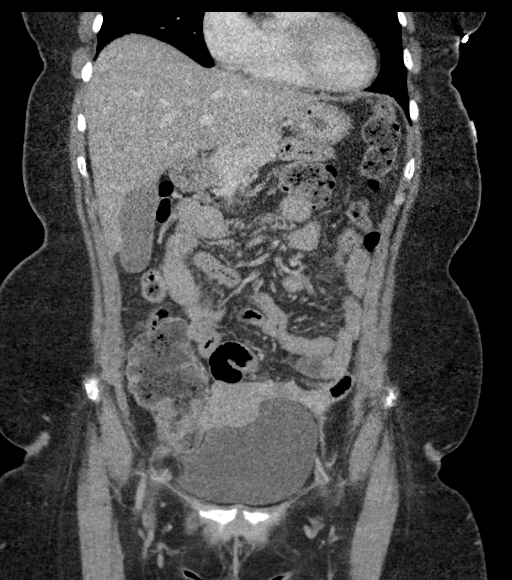
[im 67/151  soft-tissue]
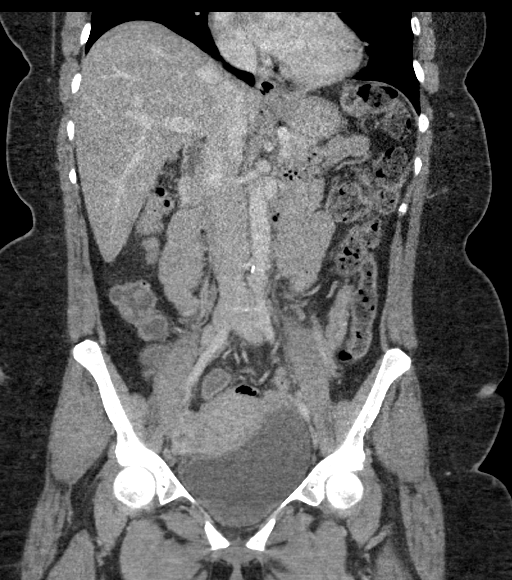
[im 84/151  soft-tissue]
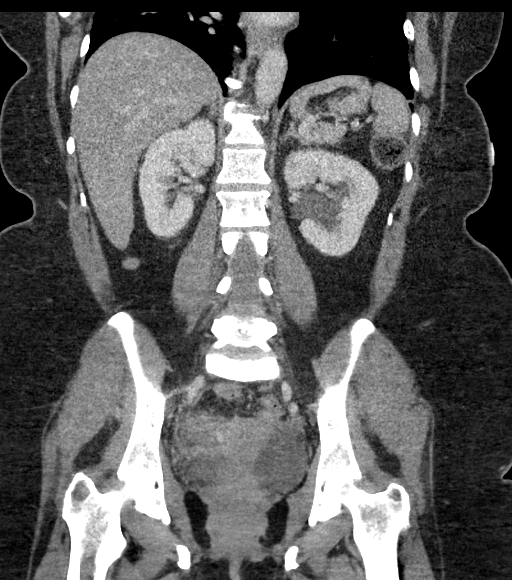

[17 of 46 positions shown; findings below may reference images not displayed]

FINDINGS: Lower chest: Lung bases are clear.  No pleural or pericardial fluid.

Hepatobiliary: Diffuse fatty change of the liver. No focal liver
lesion. No calcified gallstones.

Pancreas: Normal

Spleen: Normal

Adrenals/Urinary Tract: Adrenal glands are normal. Kidneys are
normal. Bladder is normal.

Stomach/Bowel: No abnormality. No sign of obstruction or
inflammatory disease. I do not see the appendix, either normal or
abnormal.

Vascular/Lymphatic: Aortic atherosclerosis, minimal. IVC is normal.
No retroperitoneal adenopathy.

Reproductive: Normal

Other: No free fluid or air.

Musculoskeletal: Ordinary lower lumbar degenerative changes.
IMPRESSION: Diffuse fatty change of the liver. Otherwise unremarkable scan. No
abnormality seen otherwise to explain abdominal pain.

## 2019-07-08 ENCOUNTER — Ambulatory Visit
Admission: RE | Admit: 2019-07-08 | Discharge: 2019-07-08 | Disposition: A | Discharge status: Home / Self Care | Payer: BC Managed Care – PPO | Source: Ambulatory Visit | Attending: Internal Medicine | Admitting: Internal Medicine

## 2019-07-08 ENCOUNTER — Encounter: Payer: Self-pay | Admitting: Family Medicine

## 2019-07-08 ENCOUNTER — Other Ambulatory Visit: Payer: Self-pay

## 2019-07-08 ENCOUNTER — Ambulatory Visit: Payer: BC Managed Care – PPO | Admitting: Family Medicine

## 2019-07-08 ENCOUNTER — Ambulatory Visit: Payer: Self-pay

## 2019-07-08 VITALS — BP 132/78 | HR 91 | Ht 65.0 in | Wt 187.4 lb

## 2019-07-08 DIAGNOSIS — M170 Bilateral primary osteoarthritis of knee: Secondary | ICD-10-CM

## 2019-07-08 DIAGNOSIS — Z1231 Encounter for screening mammogram for malignant neoplasm of breast: Secondary | ICD-10-CM | POA: Diagnosis not present

## 2019-07-08 NOTE — Patient Instructions (Signed)
Orthovisc  See me in 6 weeks if not feeling better.

## 2019-07-08 NOTE — Assessment & Plan Note (Signed)
Patient given bilateral injections discussed icing regimen and home exercises.  Encouraged her to continue with the weight loss which patient has been doing remarkably well.  We discussed icing regimen, topical anti-inflammatories, discussed vitamin D supplementation.  Follow-up again in 4 to 8 weeks

## 2019-07-08 NOTE — Progress Notes (Signed)
[facility] [address] [place], [place] [address] Phone: [phone] Subjective:      CC: Bilateral knee injections  [id number]    I, Molly Weber, LAT, ATC am serving as scribe for Dr. Hulan Saas.  03/12/18: Knee injections given today.  Bilateral.  Tolerated the procedure well.  Discussed icing regimen and home exercise.  Discussed which activities to do which wants to avoid.  We discussed the possibility of Visco supplementation will see if we can get prior approval.  Follow-up with me again in 4 weeks  Update- 07/08/19: Virginia Jimenez is a 58 y.o. female coming in with complaint of B knee pain (R>L).  Pt states knee pain flared up about a month ago w/ no new MOI.  Pt rates her pain currently at an 8/10 and describes it as aching and throbbing.  She reports mechanical symptoms in her L knee.  Pt states that she is taking Aleve and Tramadol.      Past Medical History:  Diagnosis Date  . Depression   . Diabetes mellitus   . GERD (gastroesophageal reflux disease)   . Hyperlipidemia   . Hypertension    Past Surgical History:  Procedure Laterality Date  . COLONOSCOPY    . TUBAL LIGATION     Social History   Socioeconomic History  . Marital status: Divorced    Spouse name: Not on file  . Number of children: Not on file  . Years of education: Not on file  . Highest education level: Not on file  Occupational History  . Not on file  Social Needs  . Financial resource strain: Not on file  . Food insecurity    Worry: Not on file    Inability: Not on file  . Transportation needs    Medical: Not on file    Non-medical: Not on file  Tobacco Use  . Smoking status: Never Smoker  . Smokeless tobacco: Never Used  Substance and Sexual Activity  . Alcohol use: Not Currently  . Drug use: No  . Sexual activity: Not Currently    Partners: Male    Birth control/protection: Surgical    Comment: intercourse age 31, more than5 sexual  partners,des neg  Lifestyle  . Physical activity    Days per week: Not on file    Minutes per session: Not on file  . Stress: Not on file  Relationships  . Social Herbalist on phone: Not on file    Gets together: Not on file    Attends religious service: Not on file    Active member of club or organization: Not on file    Attends meetings of clubs or organizations: Not on file    Relationship status: Not on file  Other Topics Concern  . Not on file  Social History Narrative  . Not on file   No Known Allergies Family History  Problem Relation Age of Onset  . Lung cancer Mother   . Hypertension Mother   . Cancer Mother        lung  . Arthritis Father   . Heart disease Father   . Hypertension Father   . Diabetes Father   . Cancer Paternal Grandmother        colon  . Early death Neg Hx   . Kidney disease Neg Hx   . Stroke Neg Hx   . Colon polyps Neg Hx   . Esophageal cancer Neg Hx   .  Rectal cancer Neg Hx   . Stomach cancer Neg Hx   . Colon cancer Neg Hx     Current Outpatient Medications (Endocrine & Metabolic):  Marland Kitchen  Insulin Glargine-Lixisenatide (SOLIQUA) 100-33 UNT-MCG/ML SOPN, Inject 18 Units into the skin daily. .  metFORMIN (GLUCOPHAGE-XR) 750 MG 24 hr tablet, Take 2 tablets (1,500 mg total) by mouth daily with breakfast.  Current Outpatient Medications (Cardiovascular):  .  carvedilol (COREG) 3.125 MG tablet, Take 1 tablet (3.125 mg total) by mouth 2 (two) times daily with a meal. .  chlorthalidone (HYGROTON) 25 MG tablet, Take 1 tablet (25 mg total) by mouth daily. .  irbesartan (AVAPRO) 150 MG tablet, TAKE ONE TABLET BY MOUTH DAILY .  rosuvastatin (CRESTOR) 20 MG tablet, Take 1 tablet (20 mg total) by mouth daily.   Current Outpatient Medications (Analgesics):  .  aspirin EC 81 MG tablet, Take 1 tablet (81 mg total) by mouth daily. .  traMADol (ULTRAM) 50 MG tablet, Take 1 tablet (50 mg total) by mouth every 6 (six) hours as needed for severe  pain.  Current Outpatient Medications (Hematological):  .  ferrous sulfate 325 (65 FE) MG EC tablet, Take 325 mg by mouth 3 (three) times daily with meals.  Current Outpatient Medications (Other):  Marland Kitchen  Ascorbic Acid (VITAMIN C) 100 MG tablet, Take 100 mg by mouth daily. .  Vitamin D, Ergocalciferol, (DRISDOL) 50000 units CAPS capsule, Take 1 capsule (50,000 Units total) by mouth every 7 (seven) days. (Patient not taking: Reported on 07/08/2019)    Past medical history, social, surgical and family history all reviewed in electronic medical record.  No pertanent information unless stated regarding to the chief complaint.   Review of Systems:  No headache, visual changes, nausea, vomiting, diarrhea, constipation, dizziness, abdominal pain, skin rash, fevers, chills, night sweats, weight loss, swollen lymph nodes, body aches, joint swelling,chest pain, shortness of breath, mood changes.  Mild positive muscle aches  Objective  Blood pressure 132/78, pulse 91, height 5\' 5"  (1.651 m), weight 187 lb 6.4 oz (85 kg), last menstrual period 06/01/2012, SpO2 97 %.    General: No apparent distress alert and oriented x3 mood and affect normal, dressed appropriately.  HEENT: Pupils equal, extraocular movements intact  Respiratory: Patient's speak in full sentences and does not appear short of breath  Cardiovascular: Trace lower extremity edema, non tender, no erythema  Skin: Warm dry intact with no signs of infection or rash on extremities or on axial skeleton.  Abdomen: Soft nontender  Neuro: Cranial nerves II through XII are intact, neurovascularly intact in all extremities with 2+ DTRs and 2+ pulses.  Lymph: No lymphadenopathy of posterior or anterior cervical chain or axillae bilaterally.  Gait normal with good balance and coordination.  MSK:  tender with full range of motion and good stability and symmetric strength and tone of shoulders, elbows, wrist, hip and ankles bilaterally.  Knee: Bilateral  valgus deformity noted.  Abnormal thigh to calf ratio.  Tender to palpation over medial and PF joint line.  ROM full in flexion and extension and lower leg rotation. instability with valgus force.  painful patellar compression. Patellar glide with moderate crepitus. Patellar and quadriceps tendons unremarkable. Hamstring and quadriceps strength is normal.   After informed written and verbal consent, patient was seated on exam table. Right knee was prepped with alcohol swab and utilizing anterolateral approach, patient's right knee space was injected with 4:1  marcaine 0.5%: Kenalog 40mg /dL. Patient tolerated the procedure well without immediate complications.  After informed written and verbal consent, patient was seated on exam table. Left knee was prepped with alcohol swab and utilizing anterolateral approach, patient's left knee space was injected with 4:1  marcaine 0.5%: Kenalog 40mg /dL. Patient tolerated the procedure well without immediate complications.    Impression and Recommendations:     This case required medical decision making of moderate complexity. The above documentation has been reviewed and is accurate and complete Lyndal Pulley, DO       Note: This dictation was prepared with Dragon dictation along with smaller phrase technology. Any transcriptional errors that result from this process are unintentional.

## 2019-07-09 ENCOUNTER — Other Ambulatory Visit: Payer: Self-pay | Admitting: Internal Medicine

## 2019-07-09 DIAGNOSIS — I10 Essential (primary) hypertension: Secondary | ICD-10-CM

## 2019-07-09 LAB — HM MAMMOGRAPHY

## 2019-07-10 ENCOUNTER — Other Ambulatory Visit: Payer: Self-pay | Admitting: Internal Medicine

## 2019-07-10 DIAGNOSIS — R928 Other abnormal and inconclusive findings on diagnostic imaging of breast: Secondary | ICD-10-CM

## 2019-07-17 ENCOUNTER — Other Ambulatory Visit: Payer: Self-pay | Admitting: Internal Medicine

## 2019-07-17 ENCOUNTER — Other Ambulatory Visit: Payer: Self-pay

## 2019-07-17 ENCOUNTER — Ambulatory Visit
Admission: RE | Admit: 2019-07-17 | Discharge: 2019-07-17 | Disposition: A | Discharge status: Home / Self Care | Payer: BC Managed Care – PPO | Source: Ambulatory Visit | Attending: Internal Medicine | Admitting: Internal Medicine

## 2019-07-17 DIAGNOSIS — R921 Mammographic calcification found on diagnostic imaging of breast: Secondary | ICD-10-CM

## 2019-07-17 DIAGNOSIS — R928 Other abnormal and inconclusive findings on diagnostic imaging of breast: Secondary | ICD-10-CM | POA: Diagnosis not present

## 2019-07-17 LAB — HM MAMMOGRAPHY

## 2019-08-19 ENCOUNTER — Encounter: Payer: BLUE CROSS/BLUE SHIELD | Admitting: Women's Health

## 2019-08-19 ENCOUNTER — Ambulatory Visit: Payer: BC Managed Care – PPO | Admitting: Family Medicine

## 2019-08-25 ENCOUNTER — Other Ambulatory Visit: Payer: Self-pay | Admitting: Internal Medicine

## 2019-08-25 DIAGNOSIS — E118 Type 2 diabetes mellitus with unspecified complications: Secondary | ICD-10-CM

## 2019-08-25 DIAGNOSIS — I10 Essential (primary) hypertension: Secondary | ICD-10-CM

## 2019-08-26 ENCOUNTER — Other Ambulatory Visit: Payer: Self-pay | Admitting: Internal Medicine

## 2019-09-23 ENCOUNTER — Telehealth: Payer: Self-pay

## 2019-09-23 NOTE — Telephone Encounter (Signed)
Patient called to cancel her appointment tomorrow as she could not make it. Patient had asked me if the Orthovisc was covered, stated that she was told we were going to look into that. She really wants to get it as her knees are hurting really bad.

## 2019-09-24 ENCOUNTER — Ambulatory Visit: Payer: BC Managed Care – PPO | Admitting: Family Medicine

## 2019-09-29 NOTE — Telephone Encounter (Signed)
Synvisc injections approved. Please call to schedule pt for an appt.

## 2019-09-29 NOTE — Telephone Encounter (Signed)
Left message for patient to call back to schedule.  °

## 2019-09-30 ENCOUNTER — Other Ambulatory Visit: Payer: Self-pay | Admitting: Internal Medicine

## 2019-09-30 DIAGNOSIS — E785 Hyperlipidemia, unspecified: Secondary | ICD-10-CM

## 2019-10-14 ENCOUNTER — Other Ambulatory Visit: Payer: Self-pay | Admitting: Internal Medicine

## 2019-10-14 DIAGNOSIS — I10 Essential (primary) hypertension: Secondary | ICD-10-CM

## 2019-10-27 ENCOUNTER — Other Ambulatory Visit: Payer: Self-pay | Admitting: Internal Medicine

## 2019-10-27 DIAGNOSIS — M170 Bilateral primary osteoarthritis of knee: Secondary | ICD-10-CM

## 2019-11-23 ENCOUNTER — Ambulatory Visit: Payer: BC Managed Care – PPO | Admitting: Internal Medicine

## 2019-11-23 DIAGNOSIS — Z0289 Encounter for other administrative examinations: Secondary | ICD-10-CM

## 2019-12-30 LAB — HM DIABETES EYE EXAM

## 2020-01-20 ENCOUNTER — Ambulatory Visit: Payer: BC Managed Care – PPO | Admitting: Internal Medicine

## 2020-01-20 ENCOUNTER — Other Ambulatory Visit: Payer: Self-pay

## 2020-01-20 ENCOUNTER — Encounter: Payer: Self-pay | Admitting: Internal Medicine

## 2020-01-20 VITALS — BP 136/78 | HR 90 | Temp 98.2°F | Resp 16 | Ht 65.0 in | Wt 187.0 lb

## 2020-01-20 DIAGNOSIS — E559 Vitamin D deficiency, unspecified: Secondary | ICD-10-CM

## 2020-01-20 DIAGNOSIS — I10 Essential (primary) hypertension: Secondary | ICD-10-CM

## 2020-01-20 DIAGNOSIS — E785 Hyperlipidemia, unspecified: Secondary | ICD-10-CM

## 2020-01-20 DIAGNOSIS — E118 Type 2 diabetes mellitus with unspecified complications: Secondary | ICD-10-CM

## 2020-01-20 LAB — CBC WITH DIFFERENTIAL/PLATELET
Basophils Absolute: 0 10*3/uL (ref 0.0–0.1)
Basophils Relative: 0.7 % (ref 0.0–3.0)
Eosinophils Absolute: 0.1 10*3/uL (ref 0.0–0.7)
Eosinophils Relative: 1.6 % (ref 0.0–5.0)
HCT: 39.2 % (ref 36.0–46.0)
Hemoglobin: 13.2 g/dL (ref 12.0–15.0)
Lymphocytes Relative: 34.3 % (ref 12.0–46.0)
Lymphs Abs: 1.1 10*3/uL (ref 0.7–4.0)
MCHC: 33.6 g/dL (ref 30.0–36.0)
MCV: 94.8 fl (ref 78.0–100.0)
Monocytes Absolute: 0.3 10*3/uL (ref 0.1–1.0)
Monocytes Relative: 10.6 % (ref 3.0–12.0)
Neutro Abs: 1.7 10*3/uL (ref 1.4–7.7)
Neutrophils Relative %: 52.8 % (ref 43.0–77.0)
Platelets: 177 10*3/uL (ref 150.0–400.0)
RBC: 4.13 Mil/uL (ref 3.87–5.11)
RDW: 12.4 % (ref 11.5–15.5)
WBC: 3.2 10*3/uL — ABNORMAL LOW (ref 4.0–10.5)

## 2020-01-20 LAB — URINALYSIS, ROUTINE W REFLEX MICROSCOPIC
Bilirubin Urine: NEGATIVE
Hgb urine dipstick: NEGATIVE
Ketones, ur: NEGATIVE
Leukocytes,Ua: NEGATIVE
Nitrite: NEGATIVE
RBC / HPF: NONE SEEN (ref 0–?)
Specific Gravity, Urine: >=1.03 — AB (ref 1.000–1.030)
Total Protein, Urine: NEGATIVE
Urine Glucose: 100 — AB
Urobilinogen, UA: 0.2 (ref 0.0–1.0)
pH: 5.5 (ref 5.0–8.0)

## 2020-01-20 LAB — BASIC METABOLIC PANEL
BUN: 26 mg/dL — ABNORMAL HIGH (ref 6–23)
CO2: 31 mEq/L (ref 19–32)
Calcium: 9.7 mg/dL (ref 8.4–10.5)
Chloride: 100 mEq/L (ref 96–112)
Creatinine, Ser: 0.89 mg/dL (ref 0.40–1.20)
GFR: 78.73 mL/min (ref >60.00)
Glucose, Bld: 221 mg/dL — ABNORMAL HIGH (ref 70–99)
Potassium: 4.3 mEq/L (ref 3.5–5.1)
Sodium: 137 mEq/L (ref 135–145)

## 2020-01-20 LAB — VITAMIN D 25 HYDROXY (VIT D DEFICIENCY, FRACTURES): VITD: 40.97 ng/mL (ref 30.00–100.00)

## 2020-01-20 LAB — POCT GLYCOSYLATED HEMOGLOBIN (HGB A1C): Hemoglobin A1C: 9.9 % — AB (ref 4.0–5.6)

## 2020-01-20 LAB — LIPID PANEL
Cholesterol: 137 mg/dL (ref 0–200)
HDL: 66.9 mg/dL (ref >39.00)
LDL Cholesterol: 60 mg/dL (ref 0–99)
NonHDL: 70.19
Total CHOL/HDL Ratio: 2
Triglycerides: 52 mg/dL (ref 0.0–149.0)
VLDL: 10.4 mg/dL (ref 0.0–40.0)

## 2020-01-20 LAB — MICROALBUMIN / CREATININE URINE RATIO
Creatinine,U: 157.6 mg/dL
Microalb Creat Ratio: 1 mg/g (ref 0.0–30.0)
Microalb, Ur: 1.6 mg/dL (ref 0.0–1.9)

## 2020-01-20 MED ORDER — METFORMIN HCL ER 750 MG PO TB24: 1500.0000 mg | ORAL_TABLET | Freq: Every day | ORAL | 1 refills | Status: DC

## 2020-01-20 MED ORDER — CARVEDILOL 3.125 MG PO TABS: 3.1250 mg | ORAL_TABLET | Freq: Two times a day (BID) | ORAL | 1 refills | Status: DC

## 2020-01-20 MED ORDER — IRBESARTAN 150 MG PO TABS: 150.0000 mg | ORAL_TABLET | Freq: Every day | ORAL | 0 refills | Status: DC

## 2020-01-20 MED ORDER — PEN NEEDLES 32G X 6 MM MISC: 0 refills | Status: DC

## 2020-01-20 MED ORDER — ACCU-CHEK GUIDE ME W/DEVICE KIT: 1.0000 | PACK | Freq: Three times a day (TID) | 0 refills | Status: DC

## 2020-01-20 MED ORDER — ACCU-CHEK GUIDE VI STRP: ORAL_STRIP | 0 refills | Status: DC

## 2020-01-20 MED ORDER — IRBESARTAN 150 MG PO TABS: 150.0000 mg | ORAL_TABLET | Freq: Every day | ORAL | 1 refills | Status: DC

## 2020-01-20 MED ORDER — SOLIQUA 100-33 UNT-MCG/ML ~~LOC~~ SOPN: 18.0000 [IU] | PEN_INJECTOR | Freq: Every day | SUBCUTANEOUS | 0 refills | Status: DC

## 2020-01-20 MED ORDER — ROSUVASTATIN CALCIUM 20 MG PO TABS: 20.0000 mg | ORAL_TABLET | Freq: Every day | ORAL | 1 refills | Status: DC

## 2020-01-20 NOTE — Progress Notes (Signed)
Subjective:  Patient ID: Virginia Jimenez, female    DOB: 09-Feb-1961  Age: 59 y.o. MRN: [id number]  CC: Hyperlipidemia, Hypertension, and Diabetes  This visit occurred during the SARS-CoV-2 public health emergency.  Safety protocols were in place, including screening questions prior to the visit, additional usage of staff PPE, and extensive cleaning of exam room while observing appropriate contact time as indicated for disinfecting solutions.   HPI Virginia Jimenez presents for f/up - She does not monitor her blood pressure or her blood sugar.  According to prescription refills she would be out of chlorthalidone, soliqua, Metformin, and rosuvastatin.  She denies polys.  She is active and denies any recent episodes of headache, blurred vision, chest pain, shortness of breath, palpitations, edema, or fatigue.  Outpatient Medications Prior to Visit  Medication Sig Dispense Refill  . Ascorbic Acid (VITAMIN C) 100 MG tablet Take 100 mg by mouth daily.    Marland Kitchen aspirin EC 81 MG tablet Take 1 tablet (81 mg total) by mouth daily. 90 tablet 1  . ferrous sulfate 325 (65 FE) MG EC tablet Take 325 mg by mouth 3 (three) times daily with meals.    . traMADol (ULTRAM) 50 MG tablet TAKE ONE TABLET BY MOUTH EVERY 6 HOURS AS NEEDED FOR SEVERE PAIN 75 tablet 1  . Vitamin D, Ergocalciferol, (DRISDOL) 50000 units CAPS capsule Take 1 capsule (50,000 Units total) by mouth every 7 (seven) days. 12 capsule 1  . carvedilol (COREG) 3.125 MG tablet TAKE ONE TABLET BY MOUTH TWICE A DAY WITH A MEAL 180 tablet 0  . chlorthalidone (HYGROTON) 25 MG tablet Take 1 tablet (25 mg total) by mouth daily. 30 tablet 0  . Insulin Glargine-Lixisenatide (SOLIQUA) 100-33 UNT-MCG/ML SOPN Inject 18 Units into the skin daily. 3 mL 0  . irbesartan (AVAPRO) 150 MG tablet TAKE ONE TABLET BY MOUTH DAILY 90 tablet 0  . metFORMIN (GLUCOPHAGE-XR) 750 MG 24 hr tablet Take 2 tablets (1,500 mg total) by mouth daily with breakfast. 180 tablet 1  .  rosuvastatin (CRESTOR) 20 MG tablet Take 1 tablet (20 mg total) by mouth daily. 90 tablet 1   No facility-administered medications prior to visit.    ROS Review of Systems  Constitutional: Negative.  Negative for diaphoresis, fatigue and unexpected weight change.  HENT: Negative.   Respiratory: Negative.  Negative for cough, chest tightness, shortness of breath and wheezing.   Cardiovascular: Negative for chest pain, palpitations and leg swelling.  Gastrointestinal: Negative for abdominal pain, constipation, diarrhea, nausea and vomiting.  Endocrine: Negative.  Negative for polydipsia, polyphagia and polyuria.  Genitourinary: Negative.  Negative for difficulty urinating and dysuria.  Musculoskeletal: Negative.  Negative for arthralgias and myalgias.  Skin: Negative.  Negative for color change, pallor and rash.  Neurological: Negative.  Negative for dizziness, weakness and light-headedness.  Hematological: Negative for adenopathy. Does not bruise/bleed easily.  Psychiatric/Behavioral: Negative.     Objective:  BP 136/78 (BP Location: Left Arm, Patient Position: Sitting, Cuff Size: Large)   Pulse 90   Temp 98.2 F (36.8 C) (Oral)   Resp 16   Ht _0  (1.651 m)   Wt 187 lb (84.8 kg)   LMP 06/01/2012   SpO2 98%   BMI 31.12 kg/m   BP Readings from Last 3 Encounters:  01/20/20 136/78  07/08/19 132/78  04/01/19 (!) 166/84    Wt Readings from Last 3 Encounters:  01/20/20 187 lb (84.8 kg)  07/08/19 187 lb 6.4 oz (85 kg)  04/01/19  206 lb (93.4 kg)    Physical Exam Vitals reviewed.  Constitutional:      Appearance: Normal appearance.  HENT:     Nose: Nose normal.     Mouth/Throat:     Mouth: Mucous membranes are moist.  Eyes:     General: No scleral icterus.    Conjunctiva/sclera: Conjunctivae normal.  Cardiovascular:     Rate and Rhythm: Normal rate and regular rhythm.     Heart sounds: No murmur.  Pulmonary:     Effort: Pulmonary effort is normal.     Breath  sounds: No stridor. No wheezing, rhonchi or rales.  Abdominal:     General: Abdomen is protuberant. Bowel sounds are normal. There is no distension.     Palpations: Abdomen is soft. There is no hepatomegaly, splenomegaly or mass.     Tenderness: There is no abdominal tenderness.  Musculoskeletal:        General: Normal range of motion.     Cervical back: Neck supple.     Right lower leg: No edema.     Left lower leg: No edema.  Lymphadenopathy:     Cervical: No cervical adenopathy.  Skin:    General: Skin is warm and dry.     Coloration: Skin is not pale.  Neurological:     General: No focal deficit present.     Mental Status: She is alert.  Psychiatric:        Mood and Affect: Mood normal.        Behavior: Behavior normal.     Lab Results  Component Value Date   WBC 3.2 (L) 01/20/2020   HGB 13.2 01/20/2020   HCT 39.2 01/20/2020   PLT 177.0 01/20/2020   GLUCOSE 221 (H) 01/20/2020   CHOL 137 01/20/2020   TRIG 52.0 01/20/2020   HDL 66.90 01/20/2020   LDLDIRECT 138.6 04/15/2013   LDLCALC 60 01/20/2020   ALT 18 04/01/2019   AST 14 04/01/2019   NA 137 01/20/2020   K 4.3 01/20/2020   CL 100 01/20/2020   CREATININE 0.89 01/20/2020   BUN 26 (H) 01/20/2020   CO2 31 01/20/2020   TSH 0.59 07/16/2018   HGBA1C 9.9 (A) 01/20/2020   MICROALBUR 1.6 01/20/2020    MM Digital Diagnostic Unilat L  Result Date: 07/17/2019 CLINICAL DATA:  Recall from 2D screening mammography, calcifications involving the UPPER OUTER QUADRANT of the LEFT breast. EXAM: DIGITAL DIAGNOSTIC LEFT MAMMOGRAM WITH CAD COMPARISON:  Previous exam(s). ACR Breast Density Category b: There are scattered areas of fibroglandular density. FINDINGS: Standard spot magnification CC and mediolateral views of the LEFT breast calcifications and a standard 2D full field mediolateral view of the LEFT breast were obtained. Spot magnification views confirm 2 adjacent groups of likely benign dystrophic calcifications in the UPPER  OUTER QUADRANT at MIDDLE to POSTERIOR depth. The anteromedial group measures approximately 2 x 3 x 2 mm. The posterolateral group measures approximately 4 x 2 x 2 mm. There are no suspicious linear or branching forms in either group. The full field mediolateral image was processed with CAD. IMPRESSION: Adjacent likely benign groups of calcifications involving the UPPER OUTER QUADRANT of the LEFT breast. RECOMMENDATION: Diagnostic LEFT mammogram with spot magnification views of the LEFT breast calcifications in 6 months. I have discussed the findings and recommendations with the patient. If applicable, a reminder letter will be sent to the patient regarding the next appointment. BI-RADS CATEGORY  3: Probably benign. Electronically Signed   By: Sherran Needs.D.  On: 07/17/2019 16:13    Assessment & Plan:   Virginia Jimenez was seen today for hyperlipidemia, hypertension and diabetes.  Diagnoses and all orders for this visit:  Essential hypertension, benign- Her blood pressure is well controlled but I do not think she is taking the thiazide diuretic.  Will continue the current dose of the ARB and beta-blocker. -     CBC with Differential/Platelet; Future -     Basic metabolic panel; Future -     Urinalysis, Routine w reflex microscopic; Future -     Discontinue: irbesartan (AVAPRO) 150 MG tablet; Take 1 tablet (150 mg total) by mouth daily. -     carvedilol (COREG) 3.125 MG tablet; Take 1 tablet (3.125 mg total) by mouth 2 (two) times daily with a meal. -     irbesartan (AVAPRO) 150 MG tablet; Take 1 tablet (150 mg total) by mouth daily. -     Urinalysis, Routine w reflex microscopic -     Basic metabolic panel -     CBC with Differential/Platelet  Type II diabetes mellitus with manifestations ([place])- Her blood sugars are too high with an A1c of 9.9%.  I recommended that she restart Soliqua and to increase the dose by 3 units every 3 days until she achieves her blood sugar goal. I have also asked her to  restart Metformin. -     Basic metabolic panel; Future -     Microalbumin / creatinine urine ratio; Future -     POCT glycosylated hemoglobin (Hb A1C) -     Insulin Glargine-Lixisenatide (SOLIQUA) 100-33 UNT-MCG/ML SOPN; Inject 18 Units into the skin daily. -     Amb Referral to Nutrition and Diabetic E -     Discontinue: irbesartan (AVAPRO) 150 MG tablet; Take 1 tablet (150 mg total) by mouth daily. -     Blood Glucose Monitoring Suppl (ACCU-CHEK GUIDE ME) w/Device KIT; 1 Act by Does not apply route in the morning, at noon, and at bedtime. -     irbesartan (AVAPRO) 150 MG tablet; Take 1 tablet (150 mg total) by mouth daily. -     metFORMIN (GLUCOPHAGE-XR) 750 MG 24 hr tablet; Take 2 tablets (1,500 mg total) by mouth daily with breakfast. -     Microalbumin / creatinine urine ratio -     Basic metabolic panel  Vitamin D deficiency disease- Her vitamin D level is normal now. -     VITAMIN D 25 Hydroxy (Vit-D Deficiency, Fractures); Future -     VITAMIN D 25 Hydroxy (Vit-D Deficiency, Fractures)  Hyperlipidemia with target LDL less than 100- She has achieved her LDL goal and is doing well on the statin. -     Lipid panel; Future -     Discontinue: rosuvastatin (CRESTOR) 20 MG tablet; Take 1 tablet (20 mg total) by mouth daily. -     rosuvastatin (CRESTOR) 20 MG tablet; Take 1 tablet (20 mg total) by mouth daily. -     Lipid panel  Type 2 diabetes mellitus with complication, without long-term current use of insulin (HCC) -     Discontinue: irbesartan (AVAPRO) 150 MG tablet; Take 1 tablet (150 mg total) by mouth daily. -     irbesartan (AVAPRO) 150 MG tablet; Take 1 tablet (150 mg total) by mouth daily.  Other orders -     Insulin Pen Needle (PEN NEEDLES) 32G X 6 MM MISC; Use to inject insulin daily. -     glucose blood (ACCU-CHEK GUIDE) test strip;  Use as instructed to check blood sugar twice per day.   I have discontinued Cleon Gustin. Tsang's chlorthalidone. I have also changed her  carvedilol. Additionally, I am having her start on Accu-Chek Guide Me, Pen Needles, and Accu-Chek Guide. Lastly, I am having her maintain her vitamin C, ferrous sulfate, Vitamin D (Ergocalciferol), aspirin EC, traMADol, Soliqua, irbesartan, metFORMIN, and rosuvastatin.  Meds ordered this encounter  Medications  . Insulin Glargine-Lixisenatide (SOLIQUA) 100-33 UNT-MCG/ML SOPN    Sig: Inject 18 Units into the skin daily.    Dispense:  9 mL    Refill:  0  . DISCONTD: irbesartan (AVAPRO) 150 MG tablet    Sig: Take 1 tablet (150 mg total) by mouth daily.    Dispense:  90 tablet    Refill:  0  . DISCONTD: rosuvastatin (CRESTOR) 20 MG tablet    Sig: Take 1 tablet (20 mg total) by mouth daily.    Dispense:  90 tablet    Refill:  1  . Blood Glucose Monitoring Suppl (ACCU-CHEK GUIDE ME) w/Device KIT    Sig: 1 Act by Does not apply route in the morning, at noon, and at bedtime.    Dispense:  2 kit    Refill:  0  . Insulin Pen Needle (PEN NEEDLES) 32G X 6 MM MISC    Sig: Use to inject insulin daily.    Dispense:  90 each    Refill:  0    Substitutions allowed.  Marland Kitchen glucose blood (ACCU-CHEK GUIDE) test strip    Sig: Use as instructed to check blood sugar twice per day.    Dispense:  200 each    Refill:  0    Verify device with patient.  . carvedilol (COREG) 3.125 MG tablet    Sig: Take 1 tablet (3.125 mg total) by mouth 2 (two) times daily with a meal.    Dispense:  180 tablet    Refill:  1  . irbesartan (AVAPRO) 150 MG tablet    Sig: Take 1 tablet (150 mg total) by mouth daily.    Dispense:  90 tablet    Refill:  1  . metFORMIN (GLUCOPHAGE-XR) 750 MG 24 hr tablet    Sig: Take 2 tablets (1,500 mg total) by mouth daily with breakfast.    Dispense:  180 tablet    Refill:  1  . rosuvastatin (CRESTOR) 20 MG tablet    Sig: Take 1 tablet (20 mg total) by mouth daily.    Dispense:  90 tablet    Refill:  1     Follow-up: Return in about 3 months (around 04/21/2020).  Scarlette Calico, MD

## 2020-01-20 NOTE — Patient Instructions (Signed)
Type 2 Diabetes Mellitus, Diagnosis, Adult Type 2 diabetes (type 2 diabetes mellitus) is a long-term (chronic) disease. In type 2 diabetes, one or both of these problems may be present:  The pancreas does not make enough of a hormone called insulin.  Cells in the body do not respond properly to insulin that the body makes (insulin resistance). Normally, insulin allows blood sugar (glucose) to enter cells in the body. The cells use glucose for energy. Insulin resistance or lack of insulin causes excess glucose to build up in the blood instead of going into cells. As a result, high blood glucose (hyperglycemia) develops. What increases the risk? The following factors may make you more likely to develop type 2 diabetes:  Having a family member with type 2 diabetes.  Being overweight or obese.  Having an inactive (sedentary) lifestyle.  Having been diagnosed with insulin resistance.  Having a history of prediabetes, gestational diabetes, or polycystic ovary syndrome (PCOS).  Being of American-Indian, African-American, Hispanic/Latino, or Asian/Pacific Islander descent. What are the signs or symptoms? In the early stage of this condition, you may not have symptoms. Symptoms develop slowly and may include:  Increased thirst (polydipsia).  Increased hunger(polyphagia).  Increased urination (polyuria).  Increased urination during the night (nocturia).  Unexplained weight loss.  Frequent infections that keep coming back (recurring).  Fatigue.  Weakness.  Vision changes, such as blurry vision.  Cuts or bruises that are slow to heal.  Tingling or numbness in the hands or feet.  Dark patches on the skin (acanthosis nigricans). How is this diagnosed? This condition is diagnosed based on your symptoms, your medical history, a physical exam, and your blood glucose level. Your blood glucose may be checked with one or more of the following blood tests:  A fasting blood glucose (FBG)  test. You will not be allowed to eat (you will fast) for 8 hours or longer before a blood sample is taken.  A random blood glucose test. This test checks blood glucose at any time of day regardless of when you ate.  An A1c (hemoglobin A1c) blood test. This test provides information about blood glucose control over the previous 2-3 months.  An oral glucose tolerance test (OGTT). This test measures your blood glucose at two times: ? After fasting. This is your baseline blood glucose level. ? Two hours after drinking a beverage that contains glucose. You may be diagnosed with type 2 diabetes if:  Your FBG level is 126 mg/dL (7.0 mmol/L) or higher.  Your random blood glucose level is 200 mg/dL (11.1 mmol/L) or higher.  Your A1c level is 6.5% or higher.  Your OGTT result is higher than 200 mg/dL (11.1 mmol/L). These blood tests may be repeated to confirm your diagnosis. How is this treated? Your treatment may be managed by a specialist called an endocrinologist. Type 2 diabetes may be treated by following instructions from your health care provider about:  Making diet and lifestyle changes. This may include: ? Following an individualized nutrition plan that is developed by a diet and nutrition specialist (registered dietitian). ? Exercising regularly. ? Finding ways to manage stress.  Checking your blood glucose level as often as told.  Taking diabetes medicines or insulin daily. This helps to keep your blood glucose levels in the healthy range. ? If you use insulin, you may need to adjust the dosage depending on how physically active you are and what foods you eat. Your health care provider will tell you how to adjust your dosage.    Taking medicines to help prevent complications from diabetes, such as: ? Aspirin. ? Medicine to lower cholesterol. ? Medicine to control blood pressure. Your health care provider will set individualized treatment goals for you. Your goals will be based on  your age, other medical conditions you have, and how you respond to diabetes treatment. Generally, the goal of treatment is to maintain the following blood glucose levels:  Before meals (preprandial): 80-130 mg/dL (4.4-7.2 mmol/L).  After meals (postprandial): below 180 mg/dL (10 mmol/L).  A1c level: less than 7%. Follow these instructions at home: Questions to ask your health care provider  Consider asking the following questions: ? Do I need to meet with a diabetes educator? ? Where can I find a support group for people with diabetes? ? What equipment will I need to manage my diabetes at home? ? What diabetes medicines do I need, and when should I take them? ? How often do I need to check my blood glucose? ? What number can I call if I have questions? ? When is my next appointment? General instructions  Take over-the-counter and prescription medicines only as told by your health care provider.  Keep all follow-up visits as told by your health care provider. This is important.  For more information about diabetes, visit: ? American Diabetes Association (ADA): www.diabetes.org ? American Association of Diabetes Educators (AADE): www.diabeteseducator.org Contact a health care provider if:  Your blood glucose is at or above 240 mg/dL (13.3 mmol/L) for 2 days in a row.  You have been sick or have had a fever for 2 days or longer, and you are not getting better.  You have any of the following problems for more than 6 hours: ? You cannot eat or drink. ? You have nausea and vomiting. ? You have diarrhea. Get help right away if:  Your blood glucose is lower than 54 mg/dL (3.0 mmol/L).  You become confused or you have trouble thinking clearly.  You have difficulty breathing.  You have moderate or large ketone levels in your urine. Summary  Type 2 diabetes (type 2 diabetes mellitus) is a long-term (chronic) disease. In type 2 diabetes, the pancreas does not make enough of a  hormone called insulin, or cells in the body do not respond properly to insulin that the body makes (insulin resistance).  This condition is treated by making diet and lifestyle changes and taking diabetes medicines or insulin.  Your health care provider will set individualized treatment goals for you. Your goals will be based on your age, other medical conditions you have, and how you respond to diabetes treatment.  Keep all follow-up visits as told by your health care provider. This is important. This information is not intended to replace advice given to you by your health care provider. Make sure you discuss any questions you have with your health care provider. Document Revised: 11/01/2017 Document Reviewed: 10/07/2015 Elsevier Patient Education  2020 [employer]  

## 2020-01-22 MED ORDER — BLOOD GLUCOSE MONITOR KIT: PACK | 0 refills | Status: DC

## 2020-01-22 MED ORDER — COOL BLOOD GLUCOSE TEST STRIPS VI STRP: ORAL_STRIP | 3 refills | Status: DC

## 2020-01-22 NOTE — Addendum Note (Signed)
Addended by: Aviva Signs M on: 01/22/2020 10:43 AM   Modules accepted: Orders

## 2020-02-05 LAB — HM MAMMOGRAPHY

## 2020-02-12 ENCOUNTER — Ambulatory Visit
Admission: RE | Admit: 2020-02-12 | Discharge: 2020-02-12 | Disposition: A | Discharge status: Home / Self Care | Payer: BC Managed Care – PPO | Source: Ambulatory Visit | Attending: Internal Medicine | Admitting: Internal Medicine

## 2020-02-12 ENCOUNTER — Other Ambulatory Visit: Payer: Self-pay

## 2020-02-12 ENCOUNTER — Other Ambulatory Visit: Payer: Self-pay | Admitting: Internal Medicine

## 2020-02-12 DIAGNOSIS — R921 Mammographic calcification found on diagnostic imaging of breast: Secondary | ICD-10-CM

## 2020-04-08 ENCOUNTER — Other Ambulatory Visit: Payer: Self-pay

## 2020-04-08 ENCOUNTER — Ambulatory Visit: Payer: BC Managed Care – PPO | Admitting: Family Medicine

## 2020-04-08 ENCOUNTER — Encounter: Payer: Self-pay | Admitting: Family Medicine

## 2020-04-08 DIAGNOSIS — M170 Bilateral primary osteoarthritis of knee: Secondary | ICD-10-CM

## 2020-04-08 NOTE — Patient Instructions (Addendum)
Good to see you Out of work until Monday  Will take some time to work Pennsaid small amount over the most painful spot Gel injections today 2-3 months if you need it

## 2020-04-08 NOTE — Progress Notes (Signed)
[facility] [place] Phone: [phone] Subjective:   Virginia Jimenez, am serving as a scribe for Dr. Hulan Saas.  This visit occurred during the SARS-CoV-2 public health emergency.  Safety protocols were in place, including screening questions prior to the visit, additional usage of staff PPE, and extensive cleaning of exam room while observing appropriate contact time as indicated for disinfecting solutions.   I'm seeing this patient by the request  of:  Janith Lima, MD  CC: Knee injection   ALP:[id number]  09/07/2019 Knee: Bilateral valgus deformity noted.  Abnormal thigh to calf ratio.  Tender to palpation over medial and PF joint line.  ROM full in flexion and extension and lower leg rotation. instability with valgus force.  painful patellar compression. Patellar glide with moderate crepitus. Patellar and quadriceps tendons unremarkable. Hamstring and quadriceps strength is normal. Patient given bilateral injections  Update 04/08/2020  Synvisc injections Bilateral knees. States her knees have been doing worse since her last visit. synvisc was approved for bilateral knees in January of this year.     Past Medical History:  Diagnosis Date  . Depression   . Diabetes mellitus   . GERD (gastroesophageal reflux disease)   . Hyperlipidemia   . Hypertension    Past Surgical History:  Procedure Laterality Date  . COLONOSCOPY    . TUBAL LIGATION     Social History   Socioeconomic History  . Marital status: Divorced    Spouse name: Not on file  . Number of children: Not on file  . Years of education: Not on file  . Highest education level: Not on file  Occupational History  . Not on file  Tobacco Use  . Smoking status: Never Smoker  . Smokeless tobacco: Never Used  Vaping Use  . Vaping Use: Never used  Substance and Sexual Activity  . Alcohol use: Not Currently  . Drug use: No  . Sexual  activity: Not Currently    Partners: Male    Birth control/protection: Surgical    Comment: intercourse age 79, more than5 sexual partners,des neg  Other Topics Concern  . Not on file  Social History Narrative  . Not on file   Social Determinants of Health   Financial Resource Strain:   . Difficulty of Paying Living Expenses:   Food Insecurity:   . Worried About Charity fundraiser in the Last Year:   . Arboriculturist in the Last Year:   Transportation Needs:   . Film/video editor (Medical):   Marland Kitchen Lack of Transportation (Non-Medical):   Physical Activity:   . Days of Exercise per Week:   . Minutes of Exercise per Session:   Stress:   . Feeling of Stress :   Social Connections:   . Frequency of Communication with Friends and Family:   . Frequency of Social Gatherings with Friends and Family:   . Attends Religious Services:   . Active Member of Clubs or Organizations:   . Attends Archivist Meetings:   Marland Kitchen Marital Status:    No Known Allergies Family History  Problem Relation Age of Onset  . Lung cancer Mother   . Hypertension Mother   . Cancer Mother        lung  . Arthritis Father   . Heart disease Father   . Hypertension Father   . Diabetes Father   . Cancer Paternal Grandmother  colon  . Early death Neg Hx   . Kidney disease Neg Hx   . Stroke Neg Hx   . Colon polyps Neg Hx   . Esophageal cancer Neg Hx   . Rectal cancer Neg Hx   . Stomach cancer Neg Hx   . Colon cancer Neg Hx     Current Outpatient Medications (Endocrine & Metabolic):  Marland Kitchen  Insulin Glargine-Lixisenatide (SOLIQUA) 100-33 UNT-MCG/ML SOPN, Inject 18 Units into the skin daily. .  metFORMIN (GLUCOPHAGE-XR) 750 MG 24 hr tablet, Take 2 tablets (1,500 mg total) by mouth daily with breakfast.  Current Outpatient Medications (Cardiovascular):  .  carvedilol (COREG) 3.125 MG tablet, Take 1 tablet (3.125 mg total) by mouth 2 (two) times daily with a meal. .  irbesartan (AVAPRO) 150 MG  tablet, Take 1 tablet (150 mg total) by mouth daily. .  rosuvastatin (CRESTOR) 20 MG tablet, Take 1 tablet (20 mg total) by mouth daily.   Current Outpatient Medications (Analgesics):  .  aspirin EC 81 MG tablet, Take 1 tablet (81 mg total) by mouth daily. .  traMADol (ULTRAM) 50 MG tablet, TAKE ONE TABLET BY MOUTH EVERY 6 HOURS AS NEEDED FOR SEVERE PAIN  Current Outpatient Medications (Hematological):  .  ferrous sulfate 325 (65 FE) MG EC tablet, Take 325 mg by mouth 3 (three) times daily with meals.  Current Outpatient Medications (Other):  Marland Kitchen  Ascorbic Acid (VITAMIN C) 100 MG tablet, Take 100 mg by mouth daily. .  blood glucose meter kit and supplies KIT, Use to check blood sugar daily. DX: E11.8 .  glucose blood (COOL BLOOD GLUCOSE TEST STRIPS) test strip, Use to check blood sugar twice daily. DX: E11.8 .  Insulin Pen Needle (PEN NEEDLES) 32G X 6 MM MISC, Use to inject insulin daily. .  Vitamin D, Ergocalciferol, (DRISDOL) 50000 units CAPS capsule, Take 1 capsule (50,000 Units total) by mouth every 7 (seven) days.   Reviewed prior external information including notes and imaging from  primary care provider As well as notes that were available from care everywhere and other healthcare systems.  Past medical history, social, surgical and family history all reviewed in electronic medical record.  No pertanent information unless stated regarding to the chief complaint.   Review of Systems:  No headache, visual changes, nausea, vomiting, diarrhea, constipation, dizziness, abdominal pain, skin rash, fevers, chills, night sweats, weight loss, swollen lymph nodes, body aches, joint swelling, chest pain, shortness of breath, mood changes. POSITIVE muscle aches  Objective  Blood pressure (!) 130/80, pulse 84, height '5\' 5"'  (1.651 m), weight 198 lb (89.8 kg), last menstrual period 06/01/2012, SpO2 98 %.   General: No apparent distress alert and oriented x3 mood and affect normal, dressed  appropriately.  HEENT: Pupils equal, extraocular movements intact  Respiratory: Patient's speak in full sentences and does not appear short of breath  Cardiovascular: No lower extremity edema, non tender, no erythema  Neuro: Cranial nerves II through XII are intact, neurovascularly intact in all extremities with 2+ DTRs and 2+ pulses.  Gait antalgic MSK:   Knee:bilateral  valgus deformity noted. Abnormal thigh to calf ratio.  Tender to palpation over medial and PF joint line.  ROM full in flexion and extension and lower leg rotation. instability with valgus force.  painful patellar compression. Patellar glide with moderate crepitus. Patellar and quadriceps tendons unremarkable. Hamstring and quadriceps strength is normal.  After informed written and verbal consent, patient was seated on exam table. Right knee was prepped with alcohol  swab and utilizing anterolateral approach, patient's right knee space was injected with 48 mg per 3 mL of Synvisc one with a 21-gauge 2 inch needle (sodium hyaluronate) in a prefilled syringe was injected easily into the knee through a 22-gauge needle..Patient tolerated the procedure well without immediate complications.  After informed written and verbal consent, patient was seated on exam table. Left knee was prepped with alcohol swab and utilizing anterolateral approach, patient's left knee space was injected with 40 mg per 3 mL of Synvisc one with a 21-gauge 2 inch needle (sodium hyaluronate) in a prefilled syringe was injected easily into the knee through a 22-gauge needle..Patient tolerated the procedure well without immediate complications.   Impression and Recommendations:     The above documentation has been reviewed and is accurate and complete Virginia Pulley, DO       Note: This dictation was prepared with Dragon dictation along with smaller phrase technology. Any transcriptional errors that result from this process are unintentional.

## 2020-04-08 NOTE — Assessment & Plan Note (Signed)
Patient has failed all conservative therapy.  Viscosupplementation given today.  Discussed icing regimen and home exercise, icing regimen that I think will be beneficial and given trial of topical anti-inflammatories.  Patient wants to avoid any more corticosteroids with no significant improvement.  Wants to avoid any surgical intervention for as long as possible as well.  Follow-up again in 2 to 3 months

## 2020-04-16 ENCOUNTER — Other Ambulatory Visit: Payer: Self-pay | Admitting: Internal Medicine

## 2020-04-16 DIAGNOSIS — E118 Type 2 diabetes mellitus with unspecified complications: Secondary | ICD-10-CM

## 2020-04-20 ENCOUNTER — Telehealth: Payer: Self-pay | Admitting: Internal Medicine

## 2020-04-20 ENCOUNTER — Other Ambulatory Visit: Payer: Self-pay | Admitting: Internal Medicine

## 2020-04-20 DIAGNOSIS — E118 Type 2 diabetes mellitus with unspecified complications: Secondary | ICD-10-CM

## 2020-04-20 MED ORDER — SOLIQUA 100-33 UNT-MCG/ML ~~LOC~~ SOPN: 60.0000 [IU] | PEN_INJECTOR | Freq: Every day | SUBCUTANEOUS | 1 refills | Status: DC

## 2020-04-20 NOTE — Telephone Encounter (Signed)
LVM for pt to call back as soon as possible.   RE: How many units is she taking of Soliqua daily.

## 2020-04-20 NOTE — Telephone Encounter (Signed)
Documented the refill encounter.

## 2020-04-20 NOTE — Telephone Encounter (Signed)
Pt called back and stated that she is taking the 60 units of Soliqua daily.

## 2020-04-20 NOTE — Telephone Encounter (Signed)
New Message:    pt states she is returning a call for MA. Please advise.

## 2020-07-20 ENCOUNTER — Other Ambulatory Visit: Payer: Self-pay | Admitting: Internal Medicine

## 2020-07-20 DIAGNOSIS — I10 Essential (primary) hypertension: Secondary | ICD-10-CM

## 2020-07-20 DIAGNOSIS — E118 Type 2 diabetes mellitus with unspecified complications: Secondary | ICD-10-CM

## 2020-09-29 ENCOUNTER — Other Ambulatory Visit: Payer: Self-pay | Admitting: Internal Medicine

## 2020-09-29 DIAGNOSIS — E118 Type 2 diabetes mellitus with unspecified complications: Secondary | ICD-10-CM

## 2020-10-12 ENCOUNTER — Encounter: Payer: Self-pay | Admitting: Internal Medicine

## 2020-10-12 ENCOUNTER — Ambulatory Visit: Payer: BC Managed Care – PPO | Admitting: Internal Medicine

## 2020-10-12 ENCOUNTER — Other Ambulatory Visit: Payer: Self-pay

## 2020-10-12 VITALS — BP 126/84 | HR 91 | Temp 98.1°F | Ht 65.0 in | Wt 203.0 lb

## 2020-10-12 DIAGNOSIS — I10 Essential (primary) hypertension: Secondary | ICD-10-CM | POA: Diagnosis not present

## 2020-10-12 DIAGNOSIS — Z23 Encounter for immunization: Secondary | ICD-10-CM | POA: Diagnosis not present

## 2020-10-12 DIAGNOSIS — E785 Hyperlipidemia, unspecified: Secondary | ICD-10-CM | POA: Diagnosis not present

## 2020-10-12 DIAGNOSIS — E118 Type 2 diabetes mellitus with unspecified complications: Secondary | ICD-10-CM | POA: Diagnosis not present

## 2020-10-12 LAB — BASIC METABOLIC PANEL
BUN: 18 mg/dL (ref 6–23)
CO2: 29 mEq/L (ref 19–32)
Calcium: 9.6 mg/dL (ref 8.4–10.5)
Chloride: 104 mEq/L (ref 96–112)
Creatinine, Ser: 0.68 mg/dL (ref 0.40–1.20)
GFR: 95.58 mL/min (ref >60.00)
Glucose, Bld: 87 mg/dL (ref 70–99)
Potassium: 4.2 mEq/L (ref 3.5–5.1)
Sodium: 139 mEq/L (ref 135–145)

## 2020-10-12 LAB — TSH: TSH: 0.55 u[IU]/mL (ref 0.35–4.50)

## 2020-10-12 LAB — HEMOGLOBIN A1C: Hgb A1c MFr Bld: 8 % — ABNORMAL HIGH (ref 4.6–6.5)

## 2020-10-12 LAB — HEPATIC FUNCTION PANEL
ALT: 16 U/L (ref 0–35)
AST: 17 U/L (ref 0–37)
Albumin: 4.1 g/dL (ref 3.5–5.2)
Alkaline Phosphatase: 61 U/L (ref 39–117)
Bilirubin, Direct: 0.1 mg/dL (ref 0.0–0.3)
Total Bilirubin: 0.5 mg/dL (ref 0.2–1.2)
Total Protein: 7.7 g/dL (ref 6.0–8.3)

## 2020-10-12 MED ORDER — GVOKE HYPOPEN 2-PACK 1 MG/0.2ML ~~LOC~~ SOAJ: 1.0000 | Freq: Every day | SUBCUTANEOUS | 5 refills | Status: DC | PRN

## 2020-10-12 MED ORDER — SOLIQUA 100-33 UNT-MCG/ML ~~LOC~~ SOPN: 60.0000 [IU] | PEN_INJECTOR | Freq: Every day | SUBCUTANEOUS | 1 refills | Status: DC

## 2020-10-12 MED ORDER — METFORMIN HCL ER 750 MG PO TB24
1500.0000 mg | ORAL_TABLET | Freq: Every day | ORAL | 1 refills | Status: DC
Start: 2020-10-12 — End: 2021-02-15

## 2020-10-12 MED ORDER — PEN NEEDLES 32G X 6 MM MISC: 0 refills | Status: DC

## 2020-10-12 MED ORDER — ROSUVASTATIN CALCIUM 20 MG PO TABS: 20.0000 mg | ORAL_TABLET | Freq: Every day | ORAL | 1 refills | Status: DC

## 2020-10-12 NOTE — Patient Instructions (Signed)
Type 2 Diabetes Mellitus, Diagnosis, Adult Type 2 diabetes (type 2 diabetes mellitus) is a long-term, or chronic, disease. In type 2 diabetes, one or both of these problems may be present:  The pancreas does not make enough of a hormone called insulin.  Cells in the body do not respond properly to insulin that the body makes (insulin resistance). Normally, insulin allows blood sugar (glucose) to enter cells in the body. The cells use glucose for energy. Insulin resistance or lack of insulin causes excess glucose to build up in the blood instead of going into cells. This causes high blood glucose (hyperglycemia).  What are the causes? The exact cause of type 2 diabetes is not known. What increases the risk? The following factors may make you more likely to develop this condition:  Having a family member with type 2 diabetes.  Being overweight or obese.  Being inactive (sedentary).  Having been diagnosed with insulin resistance.  Having a history of prediabetes, diabetes when you were pregnant (gestational diabetes), or polycystic ovary syndrome (PCOS). What are the signs or symptoms? In the early stage of this condition, you may not have symptoms. Symptoms develop slowly and may include:  Increased thirst or hunger.  Increased urination.  Unexplained weight loss.  Tiredness (fatigue) or weakness.  Vision changes, such as blurry vision.  Dark patches on the skin. How is this diagnosed? This condition is diagnosed based on your symptoms, your medical history, a physical exam, and your blood glucose level. Your blood glucose may be checked with one or more of the following blood tests:  A fasting blood glucose (FBG) test. You will not be allowed to eat (you will fast) for 8 hours or longer before a blood sample is taken.  A random blood glucose test. This test checks blood glucose at any time of day regardless of when you ate.  An A1C (hemoglobin A1C) blood test. This test  provides information about blood glucose levels over the previous 2-3 months.  An oral glucose tolerance test (OGTT). This test measures your blood glucose at two times: ? After fasting. This is your baseline blood glucose level. ? Two hours after drinking a beverage that contains glucose. You may be diagnosed with type 2 diabetes if:  Your fasting blood glucose level is 126 mg/dL (7.0 mmol/L) or higher.  Your random blood glucose level is 200 mg/dL (11.1 mmol/L) or higher.  Your A1C level is 6.5% or higher.  Your oral glucose tolerance test result is higher than 200 mg/dL (11.1 mmol/L). These blood tests may be repeated to confirm your diagnosis.   How is this treated? Your treatment may be managed by a specialist called an endocrinologist. Type 2 diabetes may be treated by following instructions from your health care provider about:  Making dietary and lifestyle changes. These may include: ? Following a personalized nutrition plan that is developed by a registered dietitian. ? Exercising regularly. ? Finding ways to manage stress.  Checking your blood glucose level as often as told.  Taking diabetes medicines or insulin daily. This helps to keep your blood glucose levels in the healthy range.  Taking medicines to help prevent complications from diabetes. Medicines may include: ? Aspirin. ? Medicine to lower cholesterol. ? Medicine to control blood pressure. Your health care provider will set treatment goals for you. Your goals will be based on your age, other medical conditions you have, and how you respond to diabetes treatment. Generally, the goal of treatment is to maintain the   following blood glucose levels:  Before meals: 80-130 mg/dL (4.4-7.2 mmol/L).  After meals: below 180 mg/dL (10 mmol/L).  A1C level: less than 7%. Follow these instructions at home: Questions to ask your health care provider Consider asking the following questions:  Should I meet with a certified  diabetes care and education specialist?  What diabetes medicines do I need, and when should I take them?  What equipment will I need to manage my diabetes at home?  How often do I need to check my blood glucose?  Where can I find a support group for people with diabetes?  What number can I call if I have questions?  When is my next appointment? General instructions  Take over-the-counter and prescription medicines only as told by your health care provider.  Keep all follow-up visits as told by your health care provider. This is important. Where to find more information  American Diabetes Association (ADA): www.diabetes.org  American Association of Diabetes Care and Education Specialists (ADCES): www.diabeteseducator.org  International Diabetes Federation (IDF): www.idf.org Contact a health care provider if:  Your blood glucose is at or above 240 mg/dL (13.3 mmol/L) for 2 days in a row.  You have been sick or have had a fever for 2 days or longer, and you are not getting better.  You have any of the following problems for more than 6 hours: ? You cannot eat or drink. ? You have nausea and vomiting. ? You have diarrhea. Get help right away if:  You have severe hypoglycemia. This means your blood glucose is lower than 54 mg/dL (3.0 mmol/L).  You become confused or you have trouble thinking clearly.  You have difficulty breathing.  You have moderate or large ketone levels in your urine. These symptoms may represent a serious problem that is an emergency. Do not wait to see if the symptoms will go away. Get medical help right away. Call your local emergency services (911 in the [place] Do not drive yourself to the hospital. Summary  Type 2 diabetes (type 2 diabetes mellitus) is a long-term, or chronic, disease. In type 2 diabetes, the pancreas does not make enough of a hormone called insulin, or cells in the body do not respond properly to insulin that the body makes (insulin  resistance).  This condition is treated by making dietary and lifestyle changes and taking diabetes medicines or insulin.  Your health care provider will set treatment goals for you. Your goals will be based on your age, other medical conditions you have, and how you respond to diabetes treatment.  Keep all follow-up visits as told by your health care provider. This is important. This information is not intended to replace advice given to you by your health care provider. Make sure you discuss any questions you have with your health care provider. Document Revised: 03/30/2020 Document Reviewed: 03/30/2020 Elsevier Patient Education  2021 Elsevier Inc.  

## 2020-10-12 NOTE — Progress Notes (Signed)
Subjective:  Patient ID: Virginia Jimenez, female    DOB: 08-21-1961  Age: 60 y.o. MRN: [id number]  CC: Hypertension and Diabetes  This visit occurred during the SARS-CoV-2 public health emergency.  Safety protocols were in place, including screening questions prior to the visit, additional usage of staff PPE, and extensive cleaning of exam room while observing appropriate contact time as indicated for disinfecting solutions.    HPI Dietrich Ke presents for f/up - She has felt well recently and offers no complaints.  She is active and denies any recent episodes of chest pain, shortness of breath, palpitations, edema, or fatigue.  She tells me she is using 30 units of Soliqua a day but it looks like based on prescription refills she would have run out of Metformin.  Outpatient Medications Prior to Visit  Medication Sig Dispense Refill  . Ascorbic Acid (VITAMIN C) 100 MG tablet Take 100 mg by mouth daily.    Marland Kitchen aspirin EC 81 MG tablet Take 1 tablet (81 mg total) by mouth daily. 90 tablet 1  . blood glucose meter kit and supplies KIT Use to check blood sugar daily. DX: E11.8 1 each 0  . carvedilol (COREG) 3.125 MG tablet Take 1 tablet (3.125 mg total) by mouth 2 (two) times daily with a meal. 180 tablet 1  . ferrous sulfate 325 (65 FE) MG EC tablet Take 325 mg by mouth 3 (three) times daily with meals.    Marland Kitchen glucose blood (COOL BLOOD GLUCOSE TEST STRIPS) test strip Use to check blood sugar twice daily. DX: E11.8 200 each 3  . irbesartan (AVAPRO) 150 MG tablet Take 1 tablet (150 mg total) by mouth daily. 90 tablet 1  . traMADol (ULTRAM) 50 MG tablet TAKE ONE TABLET BY MOUTH EVERY 6 HOURS AS NEEDED FOR SEVERE PAIN 75 tablet 1  . Vitamin D, Ergocalciferol, (DRISDOL) 50000 units CAPS capsule Take 1 capsule (50,000 Units total) by mouth every 7 (seven) days. 12 capsule 1  . Insulin Glargine-Lixisenatide (SOLIQUA) 100-33 UNT-MCG/ML SOPN Inject 60 Units into the skin daily. 9 mL 1  . Insulin  Pen Needle (PEN NEEDLES) 32G X 6 MM MISC Use to inject insulin daily. 90 each 0  . metFORMIN (GLUCOPHAGE-XR) 750 MG 24 hr tablet Take 2 tablets (1,500 mg total) by mouth daily with breakfast. 180 tablet 1  . rosuvastatin (CRESTOR) 20 MG tablet Take 1 tablet (20 mg total) by mouth daily. 90 tablet 1   No facility-administered medications prior to visit.    ROS Review of Systems  Constitutional: Negative for appetite change, diaphoresis, fatigue and unexpected weight change.  HENT: Negative.   Eyes: Negative.   Respiratory: Negative for cough, chest tightness, shortness of breath and wheezing.   Cardiovascular: Negative for chest pain, palpitations and leg swelling.  Gastrointestinal: Negative for abdominal pain, constipation, diarrhea, nausea and vomiting.  Endocrine: Negative.  Negative for polydipsia, polyphagia and polyuria.  Genitourinary: Negative for difficulty urinating.  Musculoskeletal: Negative for arthralgias and myalgias.  Skin: Negative.  Negative for color change and pallor.  Neurological: Negative.  Negative for dizziness and weakness.  Hematological: Negative for adenopathy. Does not bruise/bleed easily.  Psychiatric/Behavioral: Negative.     Objective:  BP 126/84   Pulse 91   Temp 98.1 F (36.7 C) (Oral)   Ht '5\' 5"'  (1.651 m)   Wt 203 lb (92.1 kg)   LMP 06/01/2012   SpO2 98%   BMI 33.78 kg/m   BP Readings from Last 3 Encounters:  10/12/20 126/84  04/08/20 (!) 130/80  01/20/20 136/78    Wt Readings from Last 3 Encounters:  10/12/20 203 lb (92.1 kg)  04/08/20 198 lb (89.8 kg)  01/20/20 187 lb (84.8 kg)    Physical Exam Vitals reviewed.  Constitutional:      Appearance: She is obese.  HENT:     Nose: Nose normal.     Mouth/Throat:     Mouth: Mucous membranes are moist.  Eyes:     General: No scleral icterus.    Conjunctiva/sclera: Conjunctivae normal.  Cardiovascular:     Rate and Rhythm: Normal rate and regular rhythm.     Heart sounds: No  murmur heard.   Pulmonary:     Effort: Pulmonary effort is normal.     Breath sounds: No stridor. No wheezing, rhonchi or rales.  Abdominal:     General: Abdomen is protuberant. Bowel sounds are normal. There is no distension.     Palpations: Abdomen is soft. There is no hepatomegaly, splenomegaly or mass.     Tenderness: There is no abdominal tenderness.  Musculoskeletal:        General: Normal range of motion.     Cervical back: Neck supple.     Right lower leg: No edema.     Left lower leg: No edema.  Skin:    General: Skin is warm and dry.     Coloration: Skin is not pale.  Neurological:     General: No focal deficit present.     Mental Status: She is alert.  Psychiatric:        Mood and Affect: Mood normal.        Behavior: Behavior normal.     Lab Results  Component Value Date   WBC 3.2 (L) 01/20/2020   HGB 13.2 01/20/2020   HCT 39.2 01/20/2020   PLT 177.0 01/20/2020   GLUCOSE 87 10/12/2020   CHOL 137 01/20/2020   TRIG 52.0 01/20/2020   HDL 66.90 01/20/2020   LDLDIRECT 138.6 04/15/2013   LDLCALC 60 01/20/2020   ALT 16 10/12/2020   AST 17 10/12/2020   NA 139 10/12/2020   K 4.2 10/12/2020   CL 104 10/12/2020   CREATININE 0.68 10/12/2020   BUN 18 10/12/2020   CO2 29 10/12/2020   TSH 0.55 10/12/2020   HGBA1C 8.0 (H) 10/12/2020   MICROALBUR 1.6 01/20/2020    MM DIAG BREAST TOMO UNI LEFT  Result Date: 02/12/2020 CLINICAL DATA:  Six-month follow-up of left breast calcifications EXAM: DIGITAL DIAGNOSTIC UNILATERAL LEFT MAMMOGRAM WITH CAD AND TOMO COMPARISON:  Previous exam(s). ACR Breast Density Category b: There are scattered areas of fibroglandular density. FINDINGS: The 2 groups of calcifications in the upper-outer left breast are stable. No evidence of malignancy on the left. Mammographic images were processed with CAD. IMPRESSION: Stable probably benign left breast calcifications. RECOMMENDATION: Follow-up of the left breast calcifications in October of 2021.  The patient will be due for bilateral mammography at that time. I have discussed the findings and recommendations with the patient. If applicable, a reminder letter will be sent to the patient regarding the next appointment. BI-RADS CATEGORY  3: Probably benign. Electronically Signed   By: Dorise Bullion III M.D   On: 02/12/2020 12:19    Assessment & Plan:   Rachelle was seen today for hypertension and diabetes.  Diagnoses and all orders for this visit:  Essential hypertension, benign- Her blood pressure is adequately well controlled. -     Basic metabolic panel; Future -  Hepatic function panel; Future -     TSH; Future -     TSH -     Hepatic function panel -     Basic metabolic panel  Type II diabetes mellitus with manifestations ([place])- Her A1c is better at 8.1% but it is still too high.  I have asked her to restart Metformin and to gradually increase her Soliqua dose to try to achieve blood sugars less than 130. -     Insulin Pen Needle (PEN NEEDLES) 32G X 6 MM MISC; Use to inject insulin daily. -     Basic metabolic panel; Future -     Hemoglobin A1c; Future -     Hemoglobin A1c -     Basic metabolic panel -     HM Diabetes Foot Exam -     metFORMIN (GLUCOPHAGE-XR) 750 MG 24 hr tablet; Take 2 tablets (1,500 mg total) by mouth daily with breakfast. -     Insulin Glargine-Lixisenatide (SOLIQUA) 100-33 UNT-MCG/ML SOPN; Inject 60 Units into the skin daily. -     Glucagon (GVOKE HYPOPEN 2-PACK) 1 MG/0.2ML SOAJ; Inject 1 Act into the skin daily as needed.  Hyperlipidemia with target LDL less than 100 -     rosuvastatin (CRESTOR) 20 MG tablet; Take 1 tablet (20 mg total) by mouth daily.  Other orders -     Flu Vaccine QUAD 6+ mos PF IM (Fluarix Quad PF)   I am having Cleon Gustin. Eichorn start on [employer]. I am also having her maintain her vitamin C, ferrous sulfate, Vitamin D (Ergocalciferol), aspirin EC, traMADol, carvedilol, irbesartan, blood glucose meter kit and  supplies, Cool Blood Glucose Test Strips, Pen Needles, metFORMIN, Soliqua, and rosuvastatin.  Meds ordered this encounter  Medications  . Insulin Pen Needle (PEN NEEDLES) 32G X 6 MM MISC    Sig: Use to inject insulin daily.    Dispense:  90 each    Refill:  0    Substitutions allowed.  . metFORMIN (GLUCOPHAGE-XR) 750 MG 24 hr tablet    Sig: Take 2 tablets (1,500 mg total) by mouth daily with breakfast.    Dispense:  180 tablet    Refill:  1  . Insulin Glargine-Lixisenatide (SOLIQUA) 100-33 UNT-MCG/ML SOPN    Sig: Inject 60 Units into the skin daily.    Dispense:  9 mL    Refill:  1  . rosuvastatin (CRESTOR) 20 MG tablet    Sig: Take 1 tablet (20 mg total) by mouth daily.    Dispense:  90 tablet    Refill:  1  . Glucagon (GVOKE HYPOPEN 2-PACK) 1 MG/0.2ML SOAJ    Sig: Inject 1 Act into the skin daily as needed.    Dispense:  2 mL    Refill:  5     Follow-up: Return in about 4 months (around 02/09/2021).  Scarlette Calico, MD

## 2020-10-15 ENCOUNTER — Other Ambulatory Visit: Payer: Self-pay | Admitting: Internal Medicine

## 2020-10-15 DIAGNOSIS — M170 Bilateral primary osteoarthritis of knee: Secondary | ICD-10-CM

## 2020-10-21 ENCOUNTER — Telehealth: Payer: Self-pay

## 2020-10-21 NOTE — Telephone Encounter (Signed)
Key: [id number]   Effective from 10/21/2020 through 11/19/2020.

## 2020-10-30 ENCOUNTER — Other Ambulatory Visit: Payer: Self-pay | Admitting: Internal Medicine

## 2020-10-30 DIAGNOSIS — I10 Essential (primary) hypertension: Secondary | ICD-10-CM

## 2020-10-30 DIAGNOSIS — E118 Type 2 diabetes mellitus with unspecified complications: Secondary | ICD-10-CM

## 2020-11-05 ENCOUNTER — Other Ambulatory Visit: Payer: Self-pay | Admitting: Internal Medicine

## 2020-11-05 DIAGNOSIS — E118 Type 2 diabetes mellitus with unspecified complications: Secondary | ICD-10-CM

## 2020-12-05 ENCOUNTER — Other Ambulatory Visit: Payer: Self-pay | Admitting: Internal Medicine

## 2020-12-05 ENCOUNTER — Telehealth: Payer: Self-pay | Admitting: Internal Medicine

## 2020-12-05 DIAGNOSIS — K921 Melena: Secondary | ICD-10-CM | POA: Insufficient documentation

## 2020-12-05 NOTE — Telephone Encounter (Signed)
   Patient requesting referral to GI for screening colonoscopy; 1 episode of dark stool Declined appointment at this time

## 2020-12-12 ENCOUNTER — Other Ambulatory Visit: Payer: Self-pay | Admitting: Internal Medicine

## 2020-12-12 DIAGNOSIS — E118 Type 2 diabetes mellitus with unspecified complications: Secondary | ICD-10-CM

## 2021-01-04 ENCOUNTER — Ambulatory Visit: Payer: BC Managed Care – PPO | Admitting: Internal Medicine

## 2021-01-11 DIAGNOSIS — E119 Type 2 diabetes mellitus without complications: Secondary | ICD-10-CM | POA: Diagnosis not present

## 2021-01-11 DIAGNOSIS — H5213 Myopia, bilateral: Secondary | ICD-10-CM | POA: Diagnosis not present

## 2021-01-11 DIAGNOSIS — H2513 Age-related nuclear cataract, bilateral: Secondary | ICD-10-CM | POA: Diagnosis not present

## 2021-01-11 LAB — HM DIABETES EYE EXAM

## 2021-01-31 ENCOUNTER — Ambulatory Visit: Payer: Self-pay

## 2021-01-31 ENCOUNTER — Encounter: Payer: Self-pay | Admitting: Family Medicine

## 2021-01-31 ENCOUNTER — Ambulatory Visit (INDEPENDENT_AMBULATORY_CARE_PROVIDER_SITE_OTHER): Payer: BC Managed Care – PPO

## 2021-01-31 ENCOUNTER — Other Ambulatory Visit: Payer: Self-pay

## 2021-01-31 ENCOUNTER — Ambulatory Visit: Payer: BC Managed Care – PPO | Admitting: Family Medicine

## 2021-01-31 VITALS — BP 136/82 | HR 80 | Ht 65.0 in | Wt 211.6 lb

## 2021-01-31 DIAGNOSIS — M25561 Pain in right knee: Secondary | ICD-10-CM | POA: Diagnosis not present

## 2021-01-31 DIAGNOSIS — M25461 Effusion, right knee: Secondary | ICD-10-CM | POA: Diagnosis not present

## 2021-01-31 DIAGNOSIS — M25562 Pain in left knee: Secondary | ICD-10-CM

## 2021-01-31 DIAGNOSIS — M170 Bilateral primary osteoarthritis of knee: Secondary | ICD-10-CM | POA: Diagnosis not present

## 2021-01-31 DIAGNOSIS — G8929 Other chronic pain: Secondary | ICD-10-CM

## 2021-01-31 NOTE — Patient Instructions (Addendum)
Good to meet you today.  You had B knee injections.  Call or go to the ER if you develop a large red swollen joint with extreme pain or oozing puss.   We will work on getting authorization from your insurance for gel injections for your knees and will contact you once we hear back from your insurance.  Think about knee replacement. I am happy to refer to surgery in the future. Let me know.

## 2021-01-31 NOTE — Progress Notes (Signed)
I, [name], LAT, ATC, am serving as scribe for Dr. Lynne Leader.  Virginia Jimenez is a 60 y.o. female who presents to [place] at [facility] today for f/u of B knee pain.  She was last seen by Dr. Tamala Julian on 04/08/20 and had B Synvisc injections.  Since her last visit, pt reports that her B knees have been flared up over the last 2 months.  She denies any swelling in her knees.  Her past injections have helped a little.  She takes Aleve and Tramadol prn.  Diagnostic testing: R knee XR- 12/26/16  Pertinent review of systems: No fevers or chills  Relevant historical information: History of bilateral knee DJD.   Exam:  BP 136/82 (BP Location: Left Arm, Patient Position: Sitting, Cuff Size: Normal)   Pulse 80   Ht 5\' 5"  (1.651 m)   Wt 211 lb 9.6 oz (96 kg)   LMP 06/01/2012   SpO2 98%   BMI 35.21 kg/m  General: Well Developed, well nourished, and in no acute distress.   MSK: Right knee minimal effusion normal motion. Crepitation with range of motion. Tender palpation medial joint line.  Left knee minimal effusion normal motion with crepitation. Tender palpation medial joint line.    Lab and Radiology Results  X-ray images bilateral knees obtained today personally and independently interpreted  Right knee: Moderate to severe medial compartment DJD with moderate patellofemoral DJD.  No acute fractures.  Left knee: Moderate to severe medial compartment DJD with moderate patellofemoral DJD.  No acute fractures.  Await formal radiology review  Procedure: Real-time Ultrasound Guided Injection of right knee superior lateral patellar space Device: Philips Affiniti 50G Images permanently stored and available for review in PACS Verbal informed consent obtained.  Discussed risks and benefits of procedure. Warned about infection bleeding damage to structures skin hypopigmentation and fat atrophy among others. Patient expresses understanding and  agreement Time-out conducted.   Noted no overlying erythema, induration, or other signs of local infection.   Skin prepped in a sterile fashion.   Local anesthesia: Topical Ethyl chloride.   With sterile technique and under real time ultrasound guidance:  40 milligrams of Kenalog and 2 mL of Marcaine injected into the joint. Fluid seen entering the joint capsule.   Completed without difficulty   Pain immediately resolved suggesting accurate placement of the medication.   Advised to call if fevers/chills, erythema, induration, drainage, or persistent bleeding.   Images permanently stored and available for review in the ultrasound unit.  Impression: Technically successful ultrasound guided injection.    Procedure: Real-time Ultrasound Guided Injection of left knee superior lateral patellar space Device: Philips Affiniti 50G Images permanently stored and available for review in PACS Verbal informed consent obtained.  Discussed risks and benefits of procedure. Warned about infection bleeding damage to structures skin hypopigmentation and fat atrophy among others. Patient expresses understanding and agreement Time-out conducted.   Noted no overlying erythema, induration, or other signs of local infection.   Skin prepped in a sterile fashion.   Local anesthesia: Topical Ethyl chloride.   With sterile technique and under real time ultrasound guidance:  40 mg of Kenalog and 2 mL of Marcaine injected into knee joint. Fluid seen entering the joint capsule.   Completed without difficulty   Pain immediately resolved suggesting accurate placement of the medication.   Advised to call if fevers/chills, erythema, induration, drainage, or persistent bleeding.   Images permanently stored and available for review in the ultrasound unit.  Impression: Technically successful ultrasound guided injection.     Assessment and Plan: 60 y.o. female with bilateral knee pain due to DJD.  Patient is failing  conservative management strategies.  She last had hyaluronic acid injections in July 2021.  She notes the the bilateral Synvisc 1 injections lasted about 3 months.  Prior to that steroid injections were not working very well.  She would like injections today.  We will go ahead and proceed with steroid injections and work on authorization for hyaluronic acid injections.  However spent time talking about next steps which would be knee replacement.  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  Return once the hyaluronic acid injections are approved and when the pain returns after the steroid injections wear off.  Bilateral knee x-rays obtained today.  Last x-rays 2018.   PDMP not reviewed this encounter. Orders Placed This Encounter  Procedures  . [place] LIMITED JOINT SPACE STRUCTURES LOW BILAT(NO LINKED CHARGES)    Order Specific Question:   Reason for Exam (SYMPTOM  OR DIAGNOSIS REQUIRED)    Answer:   B knee pain    Order Specific Question:   Preferred imaging location?    Answer:   [facility]  . DG Knee AP/LAT W/Sun[facility] Left    Standing Status:   Future    Standing Expiration Date:   03/03/2021    Order Specific Question:   Reason for Exam (SYMPTOM  OR DIAGNOSIS REQUIRED)    Answer:   L knee pain    Order Specific Question:   Is patient pregnant?    Answer:   No    Order Specific Question:   Preferred imaging location?    Answer:   [facility]  . DG Knee AP/LAT W/Sunrise Right    Standing Status:   Future    Standing Expiration Date:   03/03/2021    Order Specific Question:   Reason for Exam (SYMPTOM  OR DIAGNOSIS REQUIRED)    Answer:   R knee pain    Order Specific Question:   Is patient pregnant?    Answer:   No    Order Specific Question:   Preferred imaging location?    Answer:   [facility]    No orders of the defined types were placed in this encounter.    Discussed warning signs or symptoms. Please see discharge instructions. Patient expresses understanding.   The above documentation has been reviewed and is accurate and complete Lynne Leader, M.D.

## 2021-02-01 ENCOUNTER — Ambulatory Visit: Payer: BC Managed Care – PPO | Admitting: Family Medicine

## 2021-02-01 NOTE — Progress Notes (Signed)
Right knee x-ray shows medium arthritis

## 2021-02-01 NOTE — Progress Notes (Signed)
Left knee x-ray shows arthritis.

## 2021-02-09 ENCOUNTER — Encounter: Payer: Self-pay | Admitting: Internal Medicine

## 2021-02-09 ENCOUNTER — Ambulatory Visit: Payer: BC Managed Care – PPO | Admitting: Internal Medicine

## 2021-02-09 ENCOUNTER — Other Ambulatory Visit: Payer: Self-pay

## 2021-02-09 VITALS — BP 148/80 | HR 74 | Temp 99.1°F | Ht 65.0 in | Wt 210.0 lb

## 2021-02-09 DIAGNOSIS — I10 Essential (primary) hypertension: Secondary | ICD-10-CM

## 2021-02-09 DIAGNOSIS — R195 Other fecal abnormalities: Secondary | ICD-10-CM | POA: Insufficient documentation

## 2021-02-09 DIAGNOSIS — E118 Type 2 diabetes mellitus with unspecified complications: Secondary | ICD-10-CM | POA: Diagnosis not present

## 2021-02-09 DIAGNOSIS — K219 Gastro-esophageal reflux disease without esophagitis: Secondary | ICD-10-CM

## 2021-02-09 DIAGNOSIS — R109 Unspecified abdominal pain: Secondary | ICD-10-CM | POA: Insufficient documentation

## 2021-02-09 DIAGNOSIS — R1084 Generalized abdominal pain: Secondary | ICD-10-CM | POA: Diagnosis not present

## 2021-02-09 LAB — CBC WITH DIFFERENTIAL/PLATELET
Basophils Absolute: 0 10*3/uL (ref 0.0–0.1)
Basophils Relative: 0.7 % (ref 0.0–3.0)
Eosinophils Absolute: 0.1 10*3/uL (ref 0.0–0.7)
Eosinophils Relative: 1.7 % (ref 0.0–5.0)
HCT: 37.7 % (ref 36.0–46.0)
Hemoglobin: 12.6 g/dL (ref 12.0–15.0)
Lymphocytes Relative: 27.8 % (ref 12.0–46.0)
Lymphs Abs: 1.1 10*3/uL (ref 0.7–4.0)
MCHC: 33.4 g/dL (ref 30.0–36.0)
MCV: 93.5 fl (ref 78.0–100.0)
Monocytes Absolute: 0.4 10*3/uL (ref 0.1–1.0)
Monocytes Relative: 10.2 % (ref 3.0–12.0)
Neutro Abs: 2.4 10*3/uL (ref 1.4–7.7)
Neutrophils Relative %: 59.6 % (ref 43.0–77.0)
Platelets: 162 10*3/uL (ref 150.0–400.0)
RBC: 4.03 Mil/uL (ref 3.87–5.11)
RDW: 12.6 % (ref 11.5–15.5)
WBC: 4.1 10*3/uL (ref 4.0–10.5)

## 2021-02-09 LAB — BASIC METABOLIC PANEL
BUN: 21 mg/dL (ref 6–23)
CO2: 31 mEq/L (ref 19–32)
Calcium: 9.7 mg/dL (ref 8.4–10.5)
Chloride: 104 mEq/L (ref 96–112)
Creatinine, Ser: 0.84 mg/dL (ref 0.40–1.20)
GFR: 76.08 mL/min (ref >60.00)
Glucose, Bld: 140 mg/dL — ABNORMAL HIGH (ref 70–99)
Potassium: 4.4 mEq/L (ref 3.5–5.1)
Sodium: 140 mEq/L (ref 135–145)

## 2021-02-09 LAB — HEPATIC FUNCTION PANEL
ALT: 21 U/L (ref 0–35)
AST: 14 U/L (ref 0–37)
Albumin: 4.1 g/dL (ref 3.5–5.2)
Alkaline Phosphatase: 66 U/L (ref 39–117)
Bilirubin, Direct: 0.1 mg/dL (ref 0.0–0.3)
Total Bilirubin: 0.6 mg/dL (ref 0.2–1.2)
Total Protein: 7.3 g/dL (ref 6.0–8.3)

## 2021-02-09 LAB — LIPASE: Lipase: 16 U/L (ref 11.0–59.0)

## 2021-02-09 LAB — IBC PANEL
Iron: 88 ug/dL (ref 42–145)
Saturation Ratios: 28.2 % (ref 20.0–50.0)
Transferrin: 223 mg/dL (ref 212.0–360.0)

## 2021-02-09 LAB — FERRITIN: Ferritin: 102 ng/mL (ref 10.0–291.0)

## 2021-02-09 LAB — HEMOGLOBIN A1C: Hgb A1c MFr Bld: 7.7 % — ABNORMAL HIGH (ref 4.6–6.5)

## 2021-02-09 MED ORDER — PANTOPRAZOLE SODIUM 40 MG PO TBEC: 40.0000 mg | DELAYED_RELEASE_TABLET | Freq: Every day | ORAL | 3 refills | Status: DC

## 2021-02-09 MED ORDER — INSULIN GLARGINE 100 UNIT/ML SOLOSTAR PEN: 40.0000 [IU] | PEN_INJECTOR | Freq: Every day | SUBCUTANEOUS | 11 refills | Status: DC

## 2021-02-09 NOTE — Patient Instructions (Addendum)
Ok to stop the soliqua  Please take all new medication as prescribed - the insulin glargine at 40 units per day  Please take all new medication as prescribed- the protonix 40 mg per day  Please continue all other medications as before, and refills have been done if requested.  Please have the pharmacy call with any other refills you may need.  Please continue your efforts at being more active, low cholesterol diet, and weight control.  Please keep your appointments with your specialists as you may have planned  Please go to the LAB at the blood drawing area for the tests to be done  You will be contacted by phone if any changes need to be made immediately.  Otherwise, you will receive a letter about your results with an explanation, but please check with MyChart first.  Please remember to sign up for MyChart if you have not done so, as this will be important to you in the future with finding out test results, communicating by private email, and scheduling acute appointments online when needed.  Please see Dr Ronnald Ramp in 2-3 weeks to follow up

## 2021-02-09 NOTE — Progress Notes (Addendum)
Patient ID: Ardelia Wrede, female   DOB: 1960-12-02, 60 y.o.   MRN: [id number]        Chief Complaint: GI upset since starting soliqua       HPI:  Virginia Jimenez is a 60 y.o. female here with c/o GI upset with diffuse abd pains, bloating, nausea, loose and soft stools with higher frequency, starting after began soliqua, of which she is only taking 35 units as well, as was wary of taking the prescribed dose of 60.Marland Kitchen  Denies worsening reflux, dysphagia, or blood, though several stools recently have been somewhat dark maybe black?. Pt not taking iron pills, and mother with hx of colitis.  Pt denies fever, wt loss, night sweats, or other constitutional symptoms except appetite less as well, though has not lost most recent wt.  BP at home has been < 140/90.  Pt denies chest pain, increased sob or doe, wheezing, orthopnea, PND, increased LE swelling, palpitations, or syncope.         Wt Readings from Last 3 Encounters:  02/09/21 210 lb (95.3 kg)  01/31/21 211 lb 9.6 oz (96 kg)  10/12/20 203 lb (92.1 kg)   BP Readings from Last 3 Encounters:  02/09/21 (!) 148/80  01/31/21 136/82  10/12/20 126/84         Past Medical History:  Diagnosis Date  . Depression   . Diabetes mellitus   . GERD (gastroesophageal reflux disease)   . Hyperlipidemia   . Hypertension    Past Surgical History:  Procedure Laterality Date  . COLONOSCOPY    . TUBAL LIGATION      reports that she has never smoked. She has never used smokeless tobacco. She reports previous alcohol use. She reports that she does not use drugs. family history includes Arthritis in her father; Cancer in her mother and paternal grandmother; Diabetes in her father; Heart disease in her father; Hypertension in her father and mother; Lung cancer in her mother. No Known Allergies Current Outpatient Medications on File Prior to Visit  Medication Sig Dispense Refill  . Ascorbic Acid (VITAMIN C) 100 MG tablet Take 100 mg by mouth daily.    Marland Kitchen  aspirin EC 81 MG tablet Take 1 tablet (81 mg total) by mouth daily. 90 tablet 1  . blood glucose meter kit and supplies KIT Use to check blood sugar daily. DX: E11.8 1 each 0  . carvedilol (COREG) 3.125 MG tablet TAKE ONE TABLET BY MOUTH TWICE A DAY WITH MEALS 180 tablet 1  . ferrous sulfate 325 (65 FE) MG EC tablet Take 325 mg by mouth 3 (three) times daily with meals.    . Glucagon (GVOKE HYPOPEN 2-PACK) 1 MG/0.2ML SOAJ Inject 1 Act into the skin daily as needed. 2 mL 5  . glucose blood (COOL BLOOD GLUCOSE TEST STRIPS) test strip Use to check blood sugar twice daily. DX: E11.8 200 each 3  . Insulin Pen Needle (PEN NEEDLES) 32G X 6 MM MISC Use to inject insulin daily. 90 each 0  . irbesartan (AVAPRO) 150 MG tablet TAKE ONE TABLET BY MOUTH DAILY 90 tablet 1  . metFORMIN (GLUCOPHAGE-XR) 750 MG 24 hr tablet Take 2 tablets (1,500 mg total) by mouth daily with breakfast. 180 tablet 1  . rosuvastatin (CRESTOR) 20 MG tablet Take 1 tablet (20 mg total) by mouth daily. 90 tablet 1  . traMADol (ULTRAM) 50 MG tablet TAKE ONE TABLET BY MOUTH EVERY 6 HOURS AS NEEDED FOR SEVERE PAIN 75 tablet 3  No current facility-administered medications on file prior to visit.        ROS:  All others reviewed and negative.  Objective        PE:  BP (!) 148/80 (BP Location: Left Arm, Patient Position: Sitting, Cuff Size: Large)   Pulse 74   Temp 99.1 F (37.3 C) (Oral)   Ht '5\' 5"'  (1.651 m)   Wt 210 lb (95.3 kg)   LMP 06/01/2012   SpO2 98%   BMI 34.95 kg/m                 Constitutional: Pt appears in NAD               HENT: Head: NCAT.                Right Ear: External ear normal.                 Left Ear: External ear normal.                Eyes: . Pupils are equal, round, and reactive to light. Conjunctivae and EOM are normal               Nose: without d/c or deformity               Neck: Neck supple. Gross normal ROM               Cardiovascular: Normal rate and regular rhythm.                  Pulmonary/Chest: Effort normal and breath sounds without rales or wheezing.                Abd:  Soft, NT, ND, + BS, no organomegaly - benign               Neurological: Pt is alert. At baseline orientation, motor grossly intact               Skin: Skin is warm. No rashes, no other new lesions, LE edema - none               Psychiatric: Pt behavior is normal without agitation   Micro: none  Cardiac tracings I have personally interpreted today:  none  Pertinent Radiological findings (summarize): none   Lab Results  Component Value Date   WBC 4.1 02/09/2021   HGB 12.6 02/09/2021   HCT 37.7 02/09/2021   PLT 162.0 02/09/2021   GLUCOSE 140 (H) 02/09/2021   CHOL 137 01/20/2020   TRIG 52.0 01/20/2020   HDL 66.90 01/20/2020   LDLDIRECT 138.6 04/15/2013   LDLCALC 60 01/20/2020   ALT 21 02/09/2021   AST 14 02/09/2021   NA 140 02/09/2021   K 4.4 02/09/2021   CL 104 02/09/2021   CREATININE 0.84 02/09/2021   BUN 21 02/09/2021   CO2 31 02/09/2021   TSH 0.55 10/12/2020   HGBA1C 7.7 (H) 02/09/2021   MICROALBUR 1.6 01/20/2020   Assessment/Plan:  Remonia Otte is a 60 y.o. Black or African American [2] female with  has a past medical history of Depression, Diabetes mellitus, GERD (gastroesophageal reflux disease), Hyperlipidemia, and Hypertension.  GERD (gastroesophageal reflux disease) Overall stable, but given GI symptoms and possible dark stools, ok for PPI  Abdominal pain Suspect GI symptoms related to soliqua start; ok for change to generic lantus 40 units only,  to f/u any worsening symptoms or concerns  Dark stools Also for  cbc with labs  Essential hypertension, benign Mild elevated today, likely reactive, ok to follow on current med tx - avapro, coreg   Type II diabetes mellitus with manifestations Ok for change soliqua to lantus 40 units, f/u pcp  Followup: Return in about 2 weeks (around 02/23/2021), or to PCP.  Cathlean Cower, MD 02/12/2021 4:31 PM [facility]

## 2021-02-12 ENCOUNTER — Encounter: Payer: Self-pay | Admitting: Internal Medicine

## 2021-02-12 NOTE — Assessment & Plan Note (Signed)
Mild elevated today, likely reactive, ok to follow on current med tx - avapro, coreg

## 2021-02-12 NOTE — Assessment & Plan Note (Signed)
Also for cbc with labs 

## 2021-02-12 NOTE — Assessment & Plan Note (Signed)
Overall stable, but given GI symptoms and possible dark stools, ok for PPI

## 2021-02-12 NOTE — Assessment & Plan Note (Signed)
Suspect GI symptoms related to soliqua start; ok for change to generic lantus 40 units only,  to f/u any worsening symptoms or concerns

## 2021-02-12 NOTE — Assessment & Plan Note (Signed)
Ok for change soliqua to lantus 40 units, f/u pcp

## 2021-02-15 ENCOUNTER — Other Ambulatory Visit: Payer: Self-pay | Admitting: Internal Medicine

## 2021-02-15 DIAGNOSIS — E118 Type 2 diabetes mellitus with unspecified complications: Secondary | ICD-10-CM

## 2021-03-14 ENCOUNTER — Telehealth: Payer: Self-pay | Admitting: Internal Medicine

## 2021-03-14 NOTE — Telephone Encounter (Signed)
Patient cancelled appointment for 6.29.22 and rescheduled for 8.2.22. Patient still wondering if she could get her new insulin pen. Please advise

## 2021-03-15 ENCOUNTER — Other Ambulatory Visit: Payer: Self-pay | Admitting: Internal Medicine

## 2021-03-15 DIAGNOSIS — E118 Type 2 diabetes mellitus with unspecified complications: Secondary | ICD-10-CM

## 2021-03-15 MED ORDER — PEN NEEDLES 32G X 6 MM MISC: 0 refills | Status: DC

## 2021-03-16 ENCOUNTER — Ambulatory Visit: Payer: BC Managed Care – PPO | Admitting: Internal Medicine

## 2021-03-16 ENCOUNTER — Telehealth: Payer: Self-pay | Admitting: Gastroenterology

## 2021-03-16 NOTE — Telephone Encounter (Signed)
Patient calling to inform she is having bloody stools/diarrhea. Patient wants to know how to treat sxs.. Plz advise

## 2021-03-16 NOTE — Telephone Encounter (Signed)
Spoke with patient, she states she has been having black stools. No iron supplements. She states that she does Pepto-Bismol every now and then, advised that this can cause stools to turn black. She states that she notices some BRB every now and then when she wipes. She saw PCP for this issue and was told it was one of her medications causing that so he decided to switch that. She did not specify which medication. Advised patient that she needed an OV because it had been several years since she has been evaluated. Advised that several things can cause rectal bleeding. Advised patient that her Hgb was normal when checked last month. Advised patient to reach out to her PCP if she has worsening symptoms or go to an UC. Patient's Hgb was normal on 02/09/21. Patient has been scheduled for an appt with Dr. Havery Moros on Wednesday, 05/10/21 at 9 am. Patient verbalized understanding of all information and had no concerns at the end of the call.

## 2021-04-18 ENCOUNTER — Encounter: Payer: Self-pay | Admitting: Internal Medicine

## 2021-04-18 ENCOUNTER — Ambulatory Visit: Payer: BC Managed Care – PPO | Admitting: Internal Medicine

## 2021-04-18 ENCOUNTER — Other Ambulatory Visit: Payer: Self-pay

## 2021-04-18 VITALS — BP 164/96 | HR 79 | Temp 98.3°F | Resp 16 | Ht 65.0 in | Wt 212.0 lb

## 2021-04-18 DIAGNOSIS — Z1231 Encounter for screening mammogram for malignant neoplasm of breast: Secondary | ICD-10-CM

## 2021-04-18 DIAGNOSIS — E119 Type 2 diabetes mellitus without complications: Secondary | ICD-10-CM | POA: Diagnosis not present

## 2021-04-18 DIAGNOSIS — I119 Hypertensive heart disease without heart failure: Secondary | ICD-10-CM

## 2021-04-18 DIAGNOSIS — I10 Essential (primary) hypertension: Secondary | ICD-10-CM | POA: Diagnosis not present

## 2021-04-18 DIAGNOSIS — E559 Vitamin D deficiency, unspecified: Secondary | ICD-10-CM | POA: Diagnosis not present

## 2021-04-18 DIAGNOSIS — E785 Hyperlipidemia, unspecified: Secondary | ICD-10-CM

## 2021-04-18 DIAGNOSIS — Z23 Encounter for immunization: Secondary | ICD-10-CM

## 2021-04-18 DIAGNOSIS — E118 Type 2 diabetes mellitus with unspecified complications: Secondary | ICD-10-CM | POA: Diagnosis not present

## 2021-04-18 DIAGNOSIS — Z794 Long term (current) use of insulin: Secondary | ICD-10-CM | POA: Diagnosis not present

## 2021-04-18 DIAGNOSIS — M170 Bilateral primary osteoarthritis of knee: Secondary | ICD-10-CM

## 2021-04-18 DIAGNOSIS — Z0000 Encounter for general adult medical examination without abnormal findings: Secondary | ICD-10-CM

## 2021-04-18 LAB — URINALYSIS, ROUTINE W REFLEX MICROSCOPIC
Bilirubin Urine: NEGATIVE
Hgb urine dipstick: NEGATIVE
Ketones, ur: NEGATIVE
Leukocytes,Ua: NEGATIVE
Nitrite: NEGATIVE
RBC / HPF: NONE SEEN (ref 0–?)
Specific Gravity, Urine: 1.025 (ref 1.000–1.030)
Total Protein, Urine: NEGATIVE
Urine Glucose: >=1000 — AB
Urobilinogen, UA: 0.2 (ref 0.0–1.0)
pH: 5.5 (ref 5.0–8.0)

## 2021-04-18 LAB — LIPID PANEL
Cholesterol: 123 mg/dL (ref 0–200)
HDL: 58.6 mg/dL (ref >39.00)
LDL Cholesterol: 56 mg/dL (ref 0–99)
NonHDL: 64.43
Total CHOL/HDL Ratio: 2
Triglycerides: 41 mg/dL (ref 0.0–149.0)
VLDL: 8.2 mg/dL (ref 0.0–40.0)

## 2021-04-18 LAB — MICROALBUMIN / CREATININE URINE RATIO
Creatinine,U: 100.5 mg/dL
Microalb Creat Ratio: 0.7 mg/g (ref 0.0–30.0)
Microalb, Ur: <0.7 mg/dL (ref 0.0–1.9)

## 2021-04-18 LAB — BASIC METABOLIC PANEL
BUN: 19 mg/dL (ref 6–23)
CO2: 29 mEq/L (ref 19–32)
Calcium: 9.4 mg/dL (ref 8.4–10.5)
Chloride: 101 mEq/L (ref 96–112)
Creatinine, Ser: 0.79 mg/dL (ref 0.40–1.20)
GFR: 81.79 mL/min (ref >60.00)
Glucose, Bld: 262 mg/dL — ABNORMAL HIGH (ref 70–99)
Potassium: 4.2 mEq/L (ref 3.5–5.1)
Sodium: 138 mEq/L (ref 135–145)

## 2021-04-18 LAB — VITAMIN D 25 HYDROXY (VIT D DEFICIENCY, FRACTURES): VITD: 28.1 ng/mL — ABNORMAL LOW (ref 30.00–100.00)

## 2021-04-18 LAB — HEMOGLOBIN A1C: Hgb A1c MFr Bld: 8.9 % — ABNORMAL HIGH (ref 4.6–6.5)

## 2021-04-18 MED ORDER — IRBESARTAN 300 MG PO TABS: 300.0000 mg | ORAL_TABLET | Freq: Every day | ORAL | 0 refills | Status: DC

## 2021-04-18 MED ORDER — ROSUVASTATIN CALCIUM 20 MG PO TABS: 20.0000 mg | ORAL_TABLET | Freq: Every day | ORAL | 1 refills | Status: DC

## 2021-04-18 MED ORDER — SOLIQUA 100-33 UNT-MCG/ML ~~LOC~~ SOPN: 20.0000 [IU] | PEN_INJECTOR | Freq: Every day | SUBCUTANEOUS | 1 refills | Status: DC

## 2021-04-18 MED ORDER — CARVEDILOL 6.25 MG PO TABS: 6.2500 mg | ORAL_TABLET | Freq: Two times a day (BID) | ORAL | 0 refills | Status: DC

## 2021-04-18 MED ORDER — INDAPAMIDE 1.25 MG PO TABS: 1.2500 mg | ORAL_TABLET | Freq: Every day | ORAL | 0 refills | Status: DC

## 2021-04-18 NOTE — Progress Notes (Signed)
Subjective:  Patient ID: Virginia Jimenez, female    DOB: 1960-11-12  Age: 60 y.o. MRN: [id number]  CC: Annual Exam, Hypertension, Hyperlipidemia, and Diabetes  This visit occurred during the SARS-CoV-2 public health emergency.  Safety protocols were in place, including screening questions prior to the visit, additional usage of staff PPE, and extensive cleaning of exam room while observing appropriate contact time as indicated for disinfecting solutions.    HPI Virginia Jimenez presents for a CPX and f/up -   She is not using Lantus because her insurance would not pay for.  She complains that her blood sugars are not well controlled.  She consistently has blood sugars above 180.  She complains of fatigue but denies polys.  She is active and denies any recent episodes of headache, blurred vision, chest pain, shortness of breath, diaphoresis, dizziness, lightheadedness, or edema.  Outpatient Medications Prior to Visit  Medication Sig Dispense Refill   Ascorbic Acid (VITAMIN C) 100 MG tablet Take 100 mg by mouth daily.     blood glucose meter kit and supplies KIT Use to check blood sugar daily. DX: E11.8 1 each 0   Glucagon (GVOKE HYPOPEN 2-PACK) 1 MG/0.2ML SOAJ Inject 1 Act into the skin daily as needed. 2 mL 5   glucose blood (COOL BLOOD GLUCOSE TEST STRIPS) test strip Use to check blood sugar twice daily. DX: E11.8 200 each 3   Insulin Pen Needle (PEN NEEDLES) 32G X 6 MM MISC Use to inject insulin daily. 90 each 0   metFORMIN (GLUCOPHAGE-XR) 750 MG 24 hr tablet TAKE TWO TABLETS BY MOUTH DAILY WITH BREAKFAST 180 tablet 0   pantoprazole (PROTONIX) 40 MG tablet Take 1 tablet (40 mg total) by mouth daily. 90 tablet 3   traMADol (ULTRAM) 50 MG tablet TAKE ONE TABLET BY MOUTH EVERY 6 HOURS AS NEEDED FOR SEVERE PAIN 75 tablet 3   carvedilol (COREG) 3.125 MG tablet TAKE ONE TABLET BY MOUTH TWICE A DAY WITH MEALS 180 tablet 1   ferrous sulfate 325 (65 FE) MG EC tablet Take 325 mg by mouth 3  (three) times daily with meals.     insulin glargine (LANTUS) 100 UNIT/ML Solostar Pen Inject 40 Units into the skin daily. 15 mL 11   irbesartan (AVAPRO) 150 MG tablet TAKE ONE TABLET BY MOUTH DAILY 90 tablet 1   rosuvastatin (CRESTOR) 20 MG tablet Take 1 tablet (20 mg total) by mouth daily. 90 tablet 1   No facility-administered medications prior to visit.    ROS Review of Systems  Constitutional:  Positive for fatigue. Negative for appetite change, chills, diaphoresis, fever and unexpected weight change.  HENT: Negative.    Eyes: Negative.   Respiratory:  Negative for cough, chest tightness, shortness of breath and wheezing.   Cardiovascular:  Negative for chest pain, palpitations and leg swelling.  Gastrointestinal:  Negative for abdominal pain, constipation, diarrhea, nausea and vomiting.  Endocrine: Negative.  Negative for polydipsia, polyphagia and polyuria.  Genitourinary: Negative.  Negative for difficulty urinating.  Musculoskeletal:  Positive for arthralgias. Negative for joint swelling and myalgias.  Skin: Negative.  Negative for color change and pallor.  Neurological:  Negative for dizziness, weakness, light-headedness and headaches.  Hematological:  Negative for adenopathy. Does not bruise/bleed easily.  Psychiatric/Behavioral: Negative.     Objective:  BP (!) 164/96 (BP Location: Left Arm, Patient Position: Sitting, Cuff Size: Large)   Pulse 79   Temp 98.3 F (36.8 C) (Oral)   Resp 16  Ht '5\' 5"'  (1.651 m)   Wt 212 lb (96.2 kg)   LMP 06/01/2012   SpO2 95%   BMI 35.28 kg/m   BP Readings from Last 3 Encounters:  04/18/21 (!) 164/96  02/09/21 (!) 148/80  01/31/21 136/82    Wt Readings from Last 3 Encounters:  04/18/21 212 lb (96.2 kg)  02/09/21 210 lb (95.3 kg)  01/31/21 211 lb 9.6 oz (96 kg)    Physical Exam Vitals reviewed.  Constitutional:      Appearance: Normal appearance. She is obese. She is not ill-appearing.  HENT:     Nose: Nose normal.      Mouth/Throat:     Mouth: Mucous membranes are moist.  Eyes:     Conjunctiva/sclera: Conjunctivae normal.  Cardiovascular:     Rate and Rhythm: Normal rate and regular rhythm.     Heart sounds: No murmur heard.    Comments: EKG- NSR, 76 bpm LVH is new NS ST/T waves changes are not new Pulmonary:     Effort: Pulmonary effort is normal.     Breath sounds: No stridor. No wheezing, rhonchi or rales.  Abdominal:     General: Abdomen is flat. Bowel sounds are normal. There is no distension.     Palpations: Abdomen is soft. There is no hepatomegaly, splenomegaly or mass.     Tenderness: There is no abdominal tenderness.  Musculoskeletal:     Cervical back: Neck supple.     Right lower leg: No edema.     Left lower leg: No edema.  Lymphadenopathy:     Cervical: No cervical adenopathy.  Skin:    General: Skin is warm and dry.     Coloration: Skin is not pale.  Neurological:     General: No focal deficit present.     Mental Status: She is alert and oriented to person, place, and time. Mental status is at baseline.  Psychiatric:        Mood and Affect: Mood normal.        Behavior: Behavior normal.    Lab Results  Component Value Date   WBC 4.1 02/09/2021   HGB 12.6 02/09/2021   HCT 37.7 02/09/2021   PLT 162.0 02/09/2021   GLUCOSE 262 (H) 04/18/2021   CHOL 123 04/18/2021   TRIG 41.0 04/18/2021   HDL 58.60 04/18/2021   LDLDIRECT 138.6 04/15/2013   LDLCALC 56 04/18/2021   ALT 21 02/09/2021   AST 14 02/09/2021   NA 138 04/18/2021   K 4.2 04/18/2021   CL 101 04/18/2021   CREATININE 0.79 04/18/2021   BUN 19 04/18/2021   CO2 29 04/18/2021   TSH 0.55 10/12/2020   HGBA1C 8.9 (H) 04/18/2021   MICROALBUR <0.7 04/18/2021    MM DIAG BREAST TOMO UNI LEFT  Result Date: 02/12/2020 CLINICAL DATA:  Six-month follow-up of left breast calcifications EXAM: DIGITAL DIAGNOSTIC UNILATERAL LEFT MAMMOGRAM WITH CAD AND TOMO COMPARISON:  Previous exam(s). ACR Breast Density Category b: There  are scattered areas of fibroglandular density. FINDINGS: The 2 groups of calcifications in the upper-outer left breast are stable. No evidence of malignancy on the left. Mammographic images were processed with CAD. IMPRESSION: Stable probably benign left breast calcifications. RECOMMENDATION: Follow-up of the left breast calcifications in October of 2021. The patient will be due for bilateral mammography at that time. I have discussed the findings and recommendations with the patient. If applicable, a reminder letter will be sent to the patient regarding the next appointment. BI-RADS CATEGORY  3:  Probably benign. Electronically Signed   By: Dorise Bullion III M.D   On: 02/12/2020 12:19    Assessment & Plan:   Virginia Jimenez was seen today for annual exam, hypertension, hyperlipidemia and diabetes.  Diagnoses and all orders for this visit:  Insulin-requiring or dependent type II diabetes mellitus ([place])- Her A1c is up to 8.9%.  Will restart basal insulin and add a GLP-1 agonist. -     Insulin Glargine-Lixisenatide (SOLIQUA) 100-33 UNT-MCG/ML SOPN; Inject 20 Units into the skin daily. -     Basic metabolic panel; Future -     Microalbumin / creatinine urine ratio; Future -     Urinalysis, Routine w reflex microscopic; Future -     Hemoglobin A1c; Future -     irbesartan (AVAPRO) 300 MG tablet; Take 1 tablet (300 mg total) by mouth daily. -     Hemoglobin A1c -     Urinalysis, Routine w reflex microscopic -     Microalbumin / creatinine urine ratio -     Basic metabolic panel  Essential hypertension, benign- Her blood pressure is not adequately well controlled and she has LVH.  Will increase the dose of carvedilol and irbesartan and will add indapamide -     EKG 12-Lead -     Basic metabolic panel; Future -     Urinalysis, Routine w reflex microscopic; Future -     carvedilol (COREG) 6.25 MG tablet; Take 1 tablet (6.25 mg total) by mouth 2 (two) times daily with a meal. -     indapamide (LOZOL) 1.25  MG tablet; Take 1 tablet (1.25 mg total) by mouth daily. -     irbesartan (AVAPRO) 300 MG tablet; Take 1 tablet (300 mg total) by mouth daily. -     Urinalysis, Routine w reflex microscopic -     Basic metabolic panel  Type II diabetes mellitus with manifestations (HCC)  Type 2 diabetes mellitus with complication, without long-term current use of insulin (HCC)  Hyperlipidemia with target LDL less than 100- LDL goal achieved. Doing well on the statin  -     rosuvastatin (CRESTOR) 20 MG tablet; Take 1 tablet (20 mg total) by mouth daily.  Primary osteoarthritis of both knees  Vitamin D deficiency disease -     VITAMIN D 25 Hydroxy (Vit-D Deficiency, Fractures); Future -     VITAMIN D 25 Hydroxy (Vit-D Deficiency, Fractures)  Routine general medical examination at a health care facility-exam completed, labs reviewed, vaccines reviewed and updated, cancer screenings addressed, patient education was given. -     Lipid panel; Future -     Lipid panel  Visit for screening mammogram  LVH (left ventricular hypertrophy) due to hypertensive disease, without heart failure -     carvedilol (COREG) 6.25 MG tablet; Take 1 tablet (6.25 mg total) by mouth 2 (two) times daily with a meal. -     indapamide (LOZOL) 1.25 MG tablet; Take 1 tablet (1.25 mg total) by mouth daily. -     irbesartan (AVAPRO) 300 MG tablet; Take 1 tablet (300 mg total) by mouth daily.  Need for vaccination -     Pneumococcal conjugate vaccine 20-valent (Prevnar 20)  I have discontinued Cleon Gustin. Virginia Jimenez's ferrous sulfate, irbesartan, carvedilol, and insulin glargine. I am also having her start on Soliqua, carvedilol, indapamide, and irbesartan. Additionally, I am having her maintain her vitamin C, blood glucose meter kit and supplies, Cool Blood Glucose Test Strips, Gvoke HypoPen 2-Pack, traMADol, pantoprazole, metFORMIN, Pen Needles,  and rosuvastatin.  Meds ordered this encounter  Medications   Insulin Glargine-Lixisenatide  (SOLIQUA) 100-33 UNT-MCG/ML SOPN    Sig: Inject 20 Units into the skin daily.    Dispense:  9 mL    Refill:  1   carvedilol (COREG) 6.25 MG tablet    Sig: Take 1 tablet (6.25 mg total) by mouth 2 (two) times daily with a meal.    Dispense:  180 tablet    Refill:  0   indapamide (LOZOL) 1.25 MG tablet    Sig: Take 1 tablet (1.25 mg total) by mouth daily.    Dispense:  90 tablet    Refill:  0   irbesartan (AVAPRO) 300 MG tablet    Sig: Take 1 tablet (300 mg total) by mouth daily.    Dispense:  90 tablet    Refill:  0   rosuvastatin (CRESTOR) 20 MG tablet    Sig: Take 1 tablet (20 mg total) by mouth daily.    Dispense:  90 tablet    Refill:  1      Follow-up: Return in about 6 weeks (around 05/30/2021).  Scarlette Calico, MD

## 2021-04-18 NOTE — Patient Instructions (Addendum)
Soliqua - start at 20 units per day. Increase by 2 units every 3 days to keep the blood sugar less than 150. The max dose is 60 units per day.  Hypertension, Adult High blood pressure (hypertension) is when the force of blood pumping through the arteries is too strong. The arteries are the blood vessels that carry blood from the heart throughout the body. Hypertension forces the heart to work harder to pump blood and may cause arteries to become narrow or stiff. Untreated or uncontrolled hypertension can cause a heart attack, heart failure, a stroke, kidney disease, and otherproblems. A blood pressure reading consists of a higher number over a lower number. Ideally, your blood pressure should be below 120/80. The first ("top") number is called the systolic pressure. It is a measure of the pressure in your arteries as your heart beats. The second ("bottom") number is called the diastolic pressure. It is a measure of the pressure in your arteries as theheart relaxes. What are the causes? The exact cause of this condition is not known. There are some conditions thatresult in or are related to high blood pressure. What increases the risk? Some risk factors for high blood pressure are under your control. The following factors may make you more likely to develop this condition: Smoking. Having type 2 diabetes mellitus, high cholesterol, or both. Not getting enough exercise or physical activity. Being overweight. Having too much fat, sugar, calories, or salt (sodium) in your diet. Drinking too much alcohol. Some risk factors for high blood pressure may be difficult or impossible to change. Some of these factors include: Having chronic kidney disease. Having a family history of high blood pressure. Age. Risk increases with age. Race. You may be at higher risk if you are African American. Gender. Men are at higher risk than women before age 64. After age 90, women are at higher risk than men. Having  obstructive sleep apnea. Stress. What are the signs or symptoms? High blood pressure may not cause symptoms. Very high blood pressure (hypertensive crisis) may cause: Headache. Anxiety. Shortness of breath. Nosebleed. Nausea and vomiting. Vision changes. Severe chest pain. Seizures. How is this diagnosed? This condition is diagnosed by measuring your blood pressure while you are seated, with your arm resting on a flat surface, your legs uncrossed, and your feet flat on the floor. The cuff of the blood pressure monitor will be placed directly against the skin of your upper arm at the level of your heart. It should be measured at least twice using the same arm. Certain conditions cancause a difference in blood pressure between your right and left arms. Certain factors can cause blood pressure readings to be lower or higher than normal for a short period of time: When your blood pressure is higher when you are in a health care provider's office than when you are at home, this is called white coat hypertension. Most people with this condition do not need medicines. When your blood pressure is higher at home than when you are in a health care provider's office, this is called masked hypertension. Most people with this condition may need medicines to control blood pressure. If you have a high blood pressure reading during one visit or you have normal blood pressure with other risk factors, you may be asked to: Return on a different day to have your blood pressure checked again. Monitor your blood pressure at home for 1 week or longer. If you are diagnosed with hypertension, you may have  other blood or imaging tests to help your health care provider understand your overall risk for otherconditions. How is this treated? This condition is treated by making healthy lifestyle changes, such as eating healthy foods, exercising more, and reducing your alcohol intake. Your health care provider may prescribe  medicine if lifestyle changes are not enough to get your blood pressure under control, and if: Your systolic blood pressure is above 130. Your diastolic blood pressure is above 80. Your personal target blood pressure may vary depending on your medicalconditions, your age, and other factors. Follow these instructions at home: Eating and drinking  Eat a diet that is high in fiber and potassium, and low in sodium, added sugar, and fat. An example eating plan is called the DASH (Dietary Approaches to Stop Hypertension) diet. To eat this way: Eat plenty of fresh fruits and vegetables. Try to fill one half of your plate at each meal with fruits and vegetables. Eat whole grains, such as whole-wheat pasta, brown rice, or whole-grain bread. Fill about one fourth of your plate with whole grains. Eat or drink low-fat dairy products, such as skim milk or low-fat yogurt. Avoid fatty cuts of meat, processed or cured meats, and poultry with skin. Fill about one fourth of your plate with lean proteins, such as fish, chicken without skin, beans, eggs, or tofu. Avoid pre-made and processed foods. These tend to be higher in sodium, added sugar, and fat. Reduce your daily sodium intake. Most people with hypertension should eat less than 1,500 mg of sodium a day. Do not drink alcohol if: Your health care provider tells you not to drink. You are pregnant, may be pregnant, or are planning to become pregnant. If you drink alcohol: Limit how much you use to: 0-1 drink a day for women. 0-2 drinks a day for men. Be aware of how much alcohol is in your drink. In the [place] one drink equals one 12 oz bottle of beer (355 mL), one 5 oz glass of wine (148 mL), or one 1 oz glass of hard liquor (44 mL).  Lifestyle  Work with your health care provider to maintain a healthy body weight or to lose weight. Ask what an ideal weight is for you. Get at least 30 minutes of exercise most days of the week. Activities may include  walking, swimming, or biking. Include exercise to strengthen your muscles (resistance exercise), such as Pilates or lifting weights, as part of your weekly exercise routine. Try to do these types of exercises for 30 minutes at least 3 days a week. Do not use any products that contain nicotine or tobacco, such as cigarettes, e-cigarettes, and chewing tobacco. If you need help quitting, ask your health care provider. Monitor your blood pressure at home as told by your health care provider. Keep all follow-up visits as told by your health care provider. This is important.  Medicines Take over-the-counter and prescription medicines only as told by your health care provider. Follow directions carefully. Blood pressure medicines must be taken as prescribed. Do not skip doses of blood pressure medicine. Doing this puts you at risk for problems and can make the medicine less effective. Ask your health care provider about side effects or reactions to medicines that you should watch for. Contact a health care provider if you: Think you are having a reaction to a medicine you are taking. Have headaches that keep coming back (recurring). Feel dizzy. Have swelling in your ankles. Have trouble with your vision. Get help right  away if you: Develop a severe headache or confusion. Have unusual weakness or numbness. Feel faint. Have severe pain in your chest or abdomen. Vomit repeatedly. Have trouble breathing. Summary Hypertension is when the force of blood pumping through your arteries is too strong. If this condition is not controlled, it may put you at risk for serious complications. Your personal target blood pressure may vary depending on your medical conditions, your age, and other factors. For most people, a normal blood pressure is less than 120/80. Hypertension is treated with lifestyle changes, medicines, or a combination of both. Lifestyle changes include losing weight, eating a healthy,  low-sodium diet, exercising more, and limiting alcohol. This information is not intended to replace advice given to you by your health care provider. Make sure you discuss any questions you have with your healthcare provider. Document Revised: 05/14/2018 Document Reviewed: 05/14/2018 Elsevier Patient Education  [place].

## 2021-04-19 DIAGNOSIS — E119 Type 2 diabetes mellitus without complications: Secondary | ICD-10-CM | POA: Insufficient documentation

## 2021-04-19 DIAGNOSIS — Z794 Long term (current) use of insulin: Secondary | ICD-10-CM | POA: Insufficient documentation

## 2021-05-10 ENCOUNTER — Ambulatory Visit: Payer: BC Managed Care – PPO | Admitting: Gastroenterology

## 2021-05-10 ENCOUNTER — Other Ambulatory Visit (INDEPENDENT_AMBULATORY_CARE_PROVIDER_SITE_OTHER): Payer: BC Managed Care – PPO

## 2021-05-10 ENCOUNTER — Encounter: Payer: Self-pay | Admitting: Gastroenterology

## 2021-05-10 VITALS — BP 120/70 | HR 74 | Ht 65.0 in | Wt 213.0 lb

## 2021-05-10 DIAGNOSIS — R1013 Epigastric pain: Secondary | ICD-10-CM

## 2021-05-10 DIAGNOSIS — R110 Nausea: Secondary | ICD-10-CM

## 2021-05-10 DIAGNOSIS — R194 Change in bowel habit: Secondary | ICD-10-CM

## 2021-05-10 DIAGNOSIS — R195 Other fecal abnormalities: Secondary | ICD-10-CM

## 2021-05-10 DIAGNOSIS — R6881 Early satiety: Secondary | ICD-10-CM | POA: Diagnosis not present

## 2021-05-10 LAB — CBC WITH DIFFERENTIAL/PLATELET
Basophils Absolute: 0 10*3/uL (ref 0.0–0.1)
Basophils Relative: 0.7 % (ref 0.0–3.0)
Eosinophils Absolute: 0.1 10*3/uL (ref 0.0–0.7)
Eosinophils Relative: 1.9 % (ref 0.0–5.0)
HCT: 37.4 % (ref 36.0–46.0)
Hemoglobin: 12.4 g/dL (ref 12.0–15.0)
Lymphocytes Relative: 26.3 % (ref 12.0–46.0)
Lymphs Abs: 0.9 10*3/uL (ref 0.7–4.0)
MCHC: 33.1 g/dL (ref 30.0–36.0)
MCV: 94.2 fl (ref 78.0–100.0)
Monocytes Absolute: 0.3 10*3/uL (ref 0.1–1.0)
Monocytes Relative: 9.3 % (ref 3.0–12.0)
Neutro Abs: 2.2 10*3/uL (ref 1.4–7.7)
Neutrophils Relative %: 61.8 % (ref 43.0–77.0)
Platelets: 171 10*3/uL (ref 150.0–400.0)
RBC: 3.97 Mil/uL (ref 3.87–5.11)
RDW: 12.3 % (ref 11.5–15.5)
WBC: 3.6 10*3/uL — ABNORMAL LOW (ref 4.0–10.5)

## 2021-05-10 MED ORDER — ONDANSETRON 4 MG PO TBDP: 4.0000 mg | ORAL_TABLET | Freq: Three times a day (TID) | ORAL | 1 refills | Status: DC | PRN

## 2021-05-10 MED ORDER — OMEPRAZOLE 40 MG PO CPDR: 40.0000 mg | DELAYED_RELEASE_CAPSULE | Freq: Every day | ORAL | 3 refills | Status: DC

## 2021-05-10 MED ORDER — SUTAB 1479-225-188 MG PO TABS: 1.0000 | ORAL_TABLET | Freq: Once | ORAL | 0 refills | Status: AC

## 2021-05-10 NOTE — Patient Instructions (Addendum)
If you are age 59 or older, your body mass index should be between 23-30. Your Body mass index is 35.45 kg/m. If this is out of the aforementioned range listed, please consider follow up with your Primary Care Provider.  If you are age 75 or younger, your body mass index should be between 19-25. Your Body mass index is 35.45 kg/m. If this is out of the aformentioned range listed, please consider follow up with your Primary Care Provider.   __________________________________________________________  The [facility] providers would like to encourage you to use [facility] to communicate with providers for non-urgent requests or questions.  Due to long hold times on the telephone, sending your provider a message by [facility] may be a faster and more efficient way to get a response.  Please allow 48 business hours for a response.  Please remember that this is for non-urgent requests.    You have been scheduled for an endoscopy and colonoscopy. Please follow the written instructions given to you at your visit today. Please pick up your prep supplies at the pharmacy within the next 1-3 days. If you use inhalers (even only as needed), please bring them with you on the day of your procedure.  Discontinue NSAIDs.  We have sent the following medications to your pharmacy for you to pick up at your convenience: Omeprazole 40 mg: Take once daily Zofran 4 mg ODT: Take every 6 hours as needed  Thank you for entrusting me with your care and for choosing [employer], Dr. [place] Cellar

## 2021-05-10 NOTE — Progress Notes (Signed)
HPI :  60 year old female with a history diabetes, GERD, hypertension, hyperlipidemia, referred by Scarlette Calico, MD for bowel changes, early satiety, nausea.  She was last seen here in June 2018  Patient states about 6 months ago she has had a change in her bowels.  She has been having loose stools at baseline, once to twice per day.  She notices red blood in the toilet paper when she wipes herself.  She has had 3-4 episodes of seeing dark and tarry stools as well.  She also endorses abdominal bloating that has been bothering her and occasional abdominal pains and cramps in her lower abdomen.  She has taken a few doses of Pepto-Bismol but does not think around the time when she noticed dark stools.  She had a colonoscopy with me over 4 years ago which found 1 small adenoma and otherwise no concerning findings.  She otherwise has been dealing with some upper tract symptoms as well.  She has occasional postprandial epigastric pain.  She has had early satiety which has been bothering her if she has not been eating very well.  She feels nauseated frequently and has had rare episodes of vomiting.  Her nausea is typically postprandial.  Her GERD symptoms have actually not been bothering her too much lately.  She has not had any weight loss.  She has been using NSAIDs for knee pains which has been bothering her more recently.  Also taking Tylenol as needed.  She has diabetes and her hemoglobin A1c is 8.9.  She was started on [place] about 6 months ago and wonders if this could be related to her symptoms.  She was prescribed Protonix by her primary care but she states she never took it. She has never had an upper endoscopy.    Colonoscopy 03/13/2017 - The perianal and digital rectal examinations were normal. - A 3 mm polyp was found in the transverse colon. The polyp was sessile. The polyp was removed with a cold snare. Resection and retrieval were complete. - The exam was otherwise without abnormality on  direct and retroflexion views.  Surgical [P], transverse, polyp - TUBULAR ADENOMA (THREE FRAGMENTS). - NO HIGH GRADE DYSPLASIA OR MALIGNANCY.     Past Medical History:  Diagnosis Date   Depression    Diabetes mellitus    GERD (gastroesophageal reflux disease)    Hyperlipidemia    Hypertension      Past Surgical History:  Procedure Laterality Date   COLONOSCOPY     TUBAL LIGATION     Family History  Problem Relation Age of Onset   Lung cancer Mother        former smokers   Hypertension Mother    Colitis Mother    Colon polyps Mother    Arthritis Father    Heart disease Father    Hypertension Father    Diabetes Father    Lung cancer Father        former smokers   Cancer Paternal Grandmother        colon   Early death Neg Hx    Kidney disease Neg Hx    Stroke Neg Hx    Esophageal cancer Neg Hx    Rectal cancer Neg Hx    Stomach cancer Neg Hx    Social History   Tobacco Use   Smoking status: Never   Smokeless tobacco: Never  Vaping Use   Vaping Use: Never used  Substance Use Topics   Alcohol use: Not Currently  Drug use: No   Current Outpatient Medications  Medication Sig Dispense Refill   Ascorbic Acid (VITAMIN C) 100 MG tablet Take 100 mg by mouth daily.     blood glucose meter kit and supplies KIT Use to check blood sugar daily. DX: E11.8 1 each 0   carvedilol (COREG) 6.25 MG tablet Take 1 tablet (6.25 mg total) by mouth 2 (two) times daily with a meal. 180 tablet 0   Glucagon (GVOKE HYPOPEN 2-PACK) 1 MG/0.2ML SOAJ Inject 1 Act into the skin daily as needed. 2 mL 5   glucose blood (COOL BLOOD GLUCOSE TEST STRIPS) test strip Use to check blood sugar twice daily. DX: E11.8 200 each 3   indapamide (LOZOL) 1.25 MG tablet Take 1 tablet (1.25 mg total) by mouth daily. 90 tablet 0   Insulin Glargine-Lixisenatide (SOLIQUA) 100-33 UNT-MCG/ML SOPN Inject 20 Units into the skin daily. 9 mL 1   Insulin Pen Needle (PEN NEEDLES) 32G X 6 MM MISC Use to inject  insulin daily. 90 each 0   irbesartan (AVAPRO) 300 MG tablet Take 1 tablet (300 mg total) by mouth daily. 90 tablet 0   metFORMIN (GLUCOPHAGE-XR) 750 MG 24 hr tablet TAKE TWO TABLETS BY MOUTH DAILY WITH BREAKFAST 180 tablet 0   pantoprazole (PROTONIX) 40 MG tablet Take 1 tablet (40 mg total) by mouth daily. 90 tablet 3   rosuvastatin (CRESTOR) 20 MG tablet Take 1 tablet (20 mg total) by mouth daily. 90 tablet 1   traMADol (ULTRAM) 50 MG tablet TAKE ONE TABLET BY MOUTH EVERY 6 HOURS AS NEEDED FOR SEVERE PAIN 75 tablet 3   No current facility-administered medications for this visit.   No Known Allergies   Review of Systems: All systems reviewed and negative except where noted in HPI.    Lab Results  Component Value Date   CREATININE 0.79 04/18/2021   BUN 19 04/18/2021   NA 138 04/18/2021   K 4.2 04/18/2021   CL 101 04/18/2021   CO2 29 04/18/2021    Lab Results  Component Value Date   ALT 21 02/09/2021   AST 14 02/09/2021   ALKPHOS 66 02/09/2021   BILITOT 0.6 02/09/2021      Physical Exam: BP 120/70   Pulse 74   Ht _0  (1.651 m)   Wt 213 lb (96.6 kg)   LMP 06/01/2012   BMI 35.45 kg/m  Constitutional: Pleasant,well-developed, female in no acute distress. HEENT: Normocephalic and atraumatic. Conjunctivae are normal. No scleral icterus. Neck supple.  Cardiovascular: Normal rate, regular rhythm.  Pulmonary/chest: Effort normal and breath sounds normal.  Abdominal: Soft, nondistended, nontender. There are no masses palpable.  Extremities: no edema Lymphadenopathy: No cervical adenopathy noted. Neurological: Alert and oriented to person place and time. Skin: Skin is warm and dry. No rashes noted. Psychiatric: Normal mood and affect. Behavior is normal.   ASSESSMENT AND PLAN: 60 year old female here to reestablish care for the following:  Early satiety Nausea with vomiting Epigastric pain Dark stools Rectal bleeding Change in bowel habits  Reviewed history  with patient as above.  Quite possible she has gastroparesis in light of her diabetes causing some of the symptoms and we discussed that, however she is never had a prior upper endoscopy and I think that is reasonable to pursue to rule out PUD or other pathology especially with some of her NSAID use.  I recommend she minimize her NSAID use if possible.  We will start her on omeprazole 40 mg once daily in  the interim to see if that will help.  In light of some of her dark stools we will check a CBC to make sure no anemia.  I discussed risk benefits of endoscopy and anesthesia with her and she wants to proceed.  At the same time in light of her rectal bleeding and change in bowel habits in recent months I offered her a colonoscopy.  Following discussion of risks and benefits of this she wanted to proceed.  All questions answered, we will give her some Zofran as well for nausea in the interim to see if that will help.  If her EGD is negative and symptoms persist may pursue gastric emptying study versus empiric trial of Reglan.  Plan: - EGD / colonoscopy - stop / recduce NSAIDs - CBC today - start omeprazole 67m / day - Zofran 497mODT every 6 hours PRN #30   StJolly MangoMD [facility]  CC: JoJanith LimaMD

## 2021-05-12 ENCOUNTER — Telehealth: Payer: Self-pay

## 2021-05-12 NOTE — Telephone Encounter (Signed)
PA has been initiated.

## 2021-05-15 ENCOUNTER — Other Ambulatory Visit: Payer: Self-pay | Admitting: Internal Medicine

## 2021-05-15 DIAGNOSIS — E119 Type 2 diabetes mellitus without complications: Secondary | ICD-10-CM

## 2021-05-15 DIAGNOSIS — Z794 Long term (current) use of insulin: Secondary | ICD-10-CM

## 2021-05-15 MED ORDER — INSULIN GLARGINE-YFGN 100 UNIT/ML ~~LOC~~ SOPN: 30.0000 [IU] | PEN_INJECTOR | Freq: Every day | SUBCUTANEOUS | 0 refills | Status: DC

## 2021-05-15 NOTE — Telephone Encounter (Signed)
Per CoverMyMeds: ? ?PA was denied.  ?

## 2021-05-15 NOTE — Chronic Care Management (AMB) (Signed)
Per pharmacy,   Insurance preferred product is "Semglee yfgn" if lantus can be switched to this claim should go through  Thanks,   [employer]

## 2021-05-19 ENCOUNTER — Ambulatory Visit (AMBULATORY_SURGERY_CENTER): Payer: BC Managed Care – PPO | Admitting: Gastroenterology

## 2021-05-19 ENCOUNTER — Other Ambulatory Visit: Payer: Self-pay

## 2021-05-19 ENCOUNTER — Encounter: Payer: Self-pay | Admitting: Gastroenterology

## 2021-05-19 VITALS — BP 142/68 | HR 71 | Temp 97.8°F | Resp 10 | Ht 65.0 in | Wt 213.0 lb

## 2021-05-19 DIAGNOSIS — K2950 Unspecified chronic gastritis without bleeding: Secondary | ICD-10-CM | POA: Diagnosis not present

## 2021-05-19 DIAGNOSIS — K625 Hemorrhage of anus and rectum: Secondary | ICD-10-CM | POA: Diagnosis not present

## 2021-05-19 DIAGNOSIS — R195 Other fecal abnormalities: Secondary | ICD-10-CM

## 2021-05-19 DIAGNOSIS — Z1211 Encounter for screening for malignant neoplasm of colon: Secondary | ICD-10-CM | POA: Diagnosis not present

## 2021-05-19 DIAGNOSIS — K2970 Gastritis, unspecified, without bleeding: Secondary | ICD-10-CM | POA: Diagnosis not present

## 2021-05-19 DIAGNOSIS — R6881 Early satiety: Secondary | ICD-10-CM

## 2021-05-19 DIAGNOSIS — R197 Diarrhea, unspecified: Secondary | ICD-10-CM | POA: Diagnosis not present

## 2021-05-19 DIAGNOSIS — R194 Change in bowel habit: Secondary | ICD-10-CM

## 2021-05-19 DIAGNOSIS — R1013 Epigastric pain: Secondary | ICD-10-CM

## 2021-05-19 DIAGNOSIS — K2100 Gastro-esophageal reflux disease with esophagitis, without bleeding: Secondary | ICD-10-CM | POA: Diagnosis not present

## 2021-05-19 MED ORDER — SODIUM CHLORIDE 0.9 % IV SOLN: 500.0000 mL | Freq: Once | INTRAVENOUS | Status: DC

## 2021-05-19 NOTE — Progress Notes (Signed)
VS by SM.

## 2021-05-19 NOTE — Patient Instructions (Addendum)
Await pathology  Continue your normal medications- take Immodium as needed until Dr. Havery Moros receives biopsies Also continue Zofran as needed for nausea Start Omeprazole as previously prescribed- take 40 mg daily  Please read over handouts about hemorrhoids, gastritis, and esophagitis  YOU HAD AN ENDOSCOPIC PROCEDURE TODAY AT [facility]:   Refer to the procedure report that was given to you for any specific questions about what was found during the examination.  If the procedure report does not answer your questions, please call your gastroenterologist to clarify.  If you requested that your care partner not be given the details of your procedure findings, then the procedure report has been included in a sealed envelope for you to review at your convenience later.  YOU SHOULD EXPECT: Some feelings of bloating in the abdomen. Passage of more gas than usual.  Walking can help get rid of the air that was put into your GI tract during the procedure and reduce the bloating. If you had a lower endoscopy (such as a colonoscopy or flexible sigmoidoscopy) you may notice spotting of blood in your stool or on the toilet paper. If you underwent a bowel prep for your procedure, you may not have a normal bowel movement for a few days.  Please Note:  You might notice some irritation and congestion in your nose or some drainage.  This is from the oxygen used during your procedure.  There is no need for concern and it should clear up in a day or so.  SYMPTOMS TO REPORT IMMEDIATELY:  Following lower endoscopy (colonoscopy or flexible sigmoidoscopy):  Excessive amounts of blood in the stool  Significant tenderness or worsening of abdominal pains  Swelling of the abdomen that is new, acute  Fever of 100F or higher  Following upper endoscopy (EGD)  Vomiting of blood or coffee ground material  New chest pain or pain under the shoulder blades  Painful or persistently difficult  swallowing  New shortness of breath  Fever of 100F or higher  Black, tarry-looking stools  For urgent or emergent issues, a gastroenterologist can be reached at any hour by calling [phone]. Do not use MyChart messaging for urgent concerns.    DIET:  We do recommend a small meal at first, but then you may proceed to your regular diet.  Drink plenty of fluids but you should avoid alcoholic beverages for 24 hours.  ACTIVITY:  You should plan to take it easy for the rest of today and you should NOT DRIVE or use heavy machinery until tomorrow (because of the sedation medicines used during the test).    FOLLOW UP: Our staff will call the number listed on your records 48-72 hours following your procedure to check on you and address any questions or concerns that you may have regarding the information given to you following your procedure. If we do not reach you, we will leave a message.  We will attempt to reach you two times.  During this call, we will ask if you have developed any symptoms of COVID 19. If you develop any symptoms (ie: fever, flu-like symptoms, shortness of breath, cough etc.) before then, please call [phone].  If you test positive for Covid 19 in the 2 weeks post procedure, please call and report this information to [place].    If any biopsies were taken you will be contacted by phone or by letter within the next 1-3 weeks.  Please call us at [phone] if you have not  heard about the biopsies in 3 weeks.    SIGNATURES/CONFIDENTIALITY: You and/or your care partner have signed paperwork which will be entered into your electronic medical record.  These signatures attest to the fact that that the information above on your After Visit Summary has been reviewed and is understood.  Full responsibility of the confidentiality of this discharge information lies with you and/or your care-partner.

## 2021-05-19 NOTE — Progress Notes (Signed)
To pacu, VSS. Report to [email or web address] 

## 2021-05-19 NOTE — Progress Notes (Signed)
History and Physical Interval Note: Patient seen in the office 05/10/21 - no interval changes since that time. She is feeling slightly better than when I saw her before but has not started omeprazole or zofran yet. No new changes, she wishes to proceed with EGD and colonoscopy as outlined.   05/19/2021 8:02 AM  Virginia Jimenez  has presented today for endoscopic procedure(s), with the diagnosis of  Encounter Diagnoses  Name Primary?   Early satiety Yes   Dark stools    Change in bowel habits    Abdominal pain, epigastric    Rectal bleeding   .  The various methods of evaluation and treatment have been discussed with the patient and/or family. After consideration of risks, benefits and other options for treatment, the patient has consented to  the endoscopic procedure(s).   The patient's history has been reviewed, patient examined, no change in status, stable for surgery.  I have reviewed the patient's chart and labs.  Questions were answered to the patient's satisfaction.    Jolly Mango, MD [facility]

## 2021-05-19 NOTE — Op Note (Signed)
[facility] Patient Name: Virginia Jimenez Procedure Date: 05/19/2021 7:25 AM MRN: [id number] Endoscopist: Remo Lipps P. Havery Moros , MD Age: 60 Referring MD:  Date of Birth: [birth year] Gender: Female Account #: [id number] Procedure:                Colonoscopy Indications:              Rectal bleeding, Change in bowel habits - loose                            stools Medicines:                Monitored Anesthesia Care Procedure:                Pre-Anesthesia Assessment:                           - Prior to the procedure, a History and Physical                            was performed, and patient medications and                            allergies were reviewed. The patient's tolerance of                            previous anesthesia was also reviewed. The risks                            and benefits of the procedure and the sedation                            options and risks were discussed with the patient.                            All questions were answered, and informed consent                            was obtained. Prior Anticoagulants: The patient has                            taken no previous anticoagulant or antiplatelet                            agents. ASA Grade Assessment: II - A patient with                            mild systemic disease. After reviewing the risks                            and benefits, the patient was deemed in                            satisfactory condition to undergo the procedure.  After obtaining informed consent, the colonoscope                            was passed under direct vision. Throughout the                            procedure, the patient's blood pressure, pulse, and                            oxygen saturations were monitored continuously. The                            CF [id number] was introduced through the anus                            and advanced to the the terminal ileum, with                             identification of the appendiceal orifice and IC                            valve. The colonoscopy was performed without                            difficulty. The patient tolerated the procedure                            well. The quality of the bowel preparation was                            good. The terminal ileum, ileocecal valve,                            appendiceal orifice, and rectum were photographed. Scope In: 8:14:32 AM Scope Out: 8:36:15 AM Scope Withdrawal Time: 0 hours 15 minutes 49 seconds  Total Procedure Duration: 0 hours 21 minutes 43 seconds  Findings:                 The perianal and digital rectal examinations were                            normal.                           The terminal ileum appeared normal.                           The colon was tortuous. Abdominal pressure used for                            cecal intubation.                           Internal hemorrhoids were found during retroflexion.  The exam was otherwise without abnormality. No                            obvious inflammation or polyps.                           Biopsies for histology were taken with a cold                            forceps from the right colon, left colon and                            transverse colon for evaluation of microscopic                            colitis. Complications:            No immediate complications. Estimated blood loss:                            Minimal. Estimated Blood Loss:     Estimated blood loss was minimal. Impression:               - The examined portion of the ileum was normal.                           - Tortuous colon.                           - Internal hemorrhoids.                           - The examination was otherwise normal.                           - Biopsies were taken with a cold forceps from the                            right colon, left colon and transverse colon for                             evaluation of microscopic colitis.                           No obvious inflammatory changes or polyps. Bleeding                            likely due to internal hemorrhoids in the setting                            of loose stools. Will await pathology results. Recommendation:           - Patient has a contact number available for                            emergencies. The signs and symptoms of potential  delayed complications were discussed with the                            patient. Return to normal activities tomorrow.                            Written discharge instructions were provided to the                            patient.                           - Resume previous diet.                           - Continue present medications.                           - Await pathology results.                           - Immodium PRN for loose stools in the interim Tayvon Culley P. Ernestina Joe, MD 05/19/2021 8:48:01 AM This report has been signed electronically.

## 2021-05-19 NOTE — Op Note (Signed)
[facility] Patient Name: Virginia Jimenez Procedure Date: 05/19/2021 7:29 AM MRN: [id number] Endoscopist: Remo Lipps P. Havery Moros , MD Age: 60 Referring MD:  Date of Birth: [birth year] Gender: Female Account #: [id number] Procedure:                Upper GI endoscopy Indications:              Epigastric abdominal pain, Melena, Early satiety -                            history of diabetes. Hgb normal. Recommended trial                            of PPI but has not taken it yet. History of NSAID                            use. Medicines:                Monitored Anesthesia Care Procedure:                Pre-Anesthesia Assessment:                           - Prior to the procedure, a History and Physical                            was performed, and patient medications and                            allergies were reviewed. The patient's tolerance of                            previous anesthesia was also reviewed. The risks                            and benefits of the procedure and the sedation                            options and risks were discussed with the patient.                            All questions were answered, and informed consent                            was obtained. Prior Anticoagulants: The patient has                            taken no previous anticoagulant or antiplatelet                            agents. ASA Grade Assessment: II - A patient with                            mild systemic disease. After reviewing the risks  and benefits, the patient was deemed in                            satisfactory condition to undergo the procedure.                           After obtaining informed consent, the endoscope was                            passed under direct vision. Throughout the                            procedure, the patient's blood pressure, pulse, and                            oxygen saturations were monitored  continuously. The                            GIF [id number] was introduced through the                            mouth, and advanced to the duodenal bulb. The upper                            GI endoscopy was accomplished without difficulty.                            The patient tolerated the procedure well. Scope In: Scope Out: Findings:                 Esophagogastric landmarks were identified: the                            Z-line was found at 35 cm, the gastroesophageal                            junction was found at 35 cm and the upper extent of                            the gastric folds was found at 35 cm from the                            incisors.                           LA Grade A esophagitis was found 35 cm from the                            incisors.                           The exam of the esophagus was otherwise normal.                           Patchy mild  inflammation characterized by adherent                            blood and erythema was found in the gastric body                            and in the gastric antrum. Biopsies were taken with                            a cold forceps for Helicobacter pylori testing.                           The exam of the stomach was otherwise normal.                           The duodenal bulb and second portion of the                            duodenum were normal. Complications:            No immediate complications. Estimated blood loss:                            Minimal. Estimated Blood Loss:     Estimated blood loss was minimal. Impression:               - Esophagogastric landmarks identified.                           - LA Grade A reflux esophagitis.                           - Normal esophagus otherwise                           - Gastritis. Biopsied to rule out H pylori.                           - Normal stomach otherwise                           - Normal duodenal bulb and second portion of the                             duodenum. Recommendation:           - Patient has a contact number available for                            emergencies. The signs and symptoms of potential                            delayed complications were discussed with the                            patient. Return to normal activities tomorrow.  Written discharge instructions were provided to the                            patient.                           - Resume previous diet.                           - Continue present medications.                           - Start omeprazole as previously recommended /                            prescribed                           - Zofran as needed, previously prescibed                           - Await pathology results.                           - If symptoms persist despite trial of PPI                            consideration for empiric Reglan or gastric                            emptying study Remo Lipps P. Calina Patrie, MD 05/19/2021 8:53:46 AM This report has been signed electronically.

## 2021-05-19 NOTE — Progress Notes (Signed)
Called to room to assist during endoscopic procedure.  Patient ID and intended procedure confirmed with present staff. Received instructions for my participation in the procedure from the performing physician.  

## 2021-05-23 ENCOUNTER — Telehealth: Payer: Self-pay

## 2021-05-23 ENCOUNTER — Telehealth: Payer: Self-pay | Admitting: *Deleted

## 2021-05-23 ENCOUNTER — Other Ambulatory Visit: Payer: Self-pay | Admitting: Internal Medicine

## 2021-05-23 DIAGNOSIS — E118 Type 2 diabetes mellitus with unspecified complications: Secondary | ICD-10-CM

## 2021-05-23 NOTE — Telephone Encounter (Signed)
Left message on follow up call. 

## 2021-05-23 NOTE — Telephone Encounter (Signed)
Attempted 2nd f/u phone call. No answer. Left message.  °

## 2021-05-27 ENCOUNTER — Encounter: Payer: Self-pay | Admitting: Gastroenterology

## 2021-06-28 ENCOUNTER — Other Ambulatory Visit: Payer: Self-pay | Admitting: Internal Medicine

## 2021-06-28 DIAGNOSIS — R921 Mammographic calcification found on diagnostic imaging of breast: Secondary | ICD-10-CM

## 2021-06-29 ENCOUNTER — Other Ambulatory Visit: Payer: Self-pay | Admitting: Internal Medicine

## 2021-06-29 DIAGNOSIS — Z794 Long term (current) use of insulin: Secondary | ICD-10-CM

## 2021-06-29 DIAGNOSIS — E119 Type 2 diabetes mellitus without complications: Secondary | ICD-10-CM

## 2021-06-30 ENCOUNTER — Other Ambulatory Visit: Payer: Self-pay

## 2021-06-30 DIAGNOSIS — E119 Type 2 diabetes mellitus without complications: Secondary | ICD-10-CM

## 2021-06-30 MED ORDER — INSULIN GLARGINE-YFGN 100 UNIT/ML ~~LOC~~ SOPN
30.0000 [IU] | PEN_INJECTOR | Freq: Every day | SUBCUTANEOUS | 0 refills | Status: DC
Start: 2021-06-30 — End: 2021-08-19

## 2021-07-14 ENCOUNTER — Other Ambulatory Visit: Payer: Self-pay | Admitting: Internal Medicine

## 2021-07-14 DIAGNOSIS — I119 Hypertensive heart disease without heart failure: Secondary | ICD-10-CM

## 2021-07-14 DIAGNOSIS — I10 Essential (primary) hypertension: Secondary | ICD-10-CM

## 2021-08-04 ENCOUNTER — Ambulatory Visit
Admission: RE | Admit: 2021-08-04 | Discharge: 2021-08-04 | Disposition: A | Discharge status: Home / Self Care | Payer: BC Managed Care – PPO | Source: Ambulatory Visit | Attending: Internal Medicine | Admitting: Internal Medicine

## 2021-08-04 DIAGNOSIS — R921 Mammographic calcification found on diagnostic imaging of breast: Secondary | ICD-10-CM

## 2021-08-04 DIAGNOSIS — R922 Inconclusive mammogram: Secondary | ICD-10-CM | POA: Diagnosis not present

## 2021-08-11 ENCOUNTER — Other Ambulatory Visit: Payer: Self-pay | Admitting: Gastroenterology

## 2021-08-16 ENCOUNTER — Telehealth: Payer: Self-pay

## 2021-08-16 NOTE — Telephone Encounter (Signed)
Prior auth submitted via CoverMyMeds for omep 40mg  qd

## 2021-08-17 MED ORDER — PANTOPRAZOLE SODIUM 40 MG PO TBEC: 40.0000 mg | DELAYED_RELEASE_TABLET | Freq: Every day | ORAL | 2 refills | Status: DC

## 2021-08-17 NOTE — Telephone Encounter (Signed)
That's odd, omeprazole is usually the cheapest option. OKay to use protonix if that is covered. Thanks

## 2021-08-17 NOTE — Telephone Encounter (Signed)
Called patient. Left detailed message that I am sending pantoprazole in place of omeprazole and to call with any questions or concerns

## 2021-08-17 NOTE — Telephone Encounter (Signed)
PA for omeprazole denied.  Please advise. (Note:I don't think she has ever tried pantoprazole). Thank you.

## 2021-08-19 ENCOUNTER — Other Ambulatory Visit: Payer: Self-pay | Admitting: Internal Medicine

## 2021-08-19 DIAGNOSIS — E119 Type 2 diabetes mellitus without complications: Secondary | ICD-10-CM

## 2021-08-21 NOTE — Telephone Encounter (Signed)
Pantoprazole requires a PA.  Called and LM for patient to call her insurance company and find outwhich PPI they want her to take and call us back and let us know

## 2021-08-25 MED ORDER — ESOMEPRAZOLE MAGNESIUM 40 MG PO CPDR: 40.0000 mg | DELAYED_RELEASE_CAPSULE | Freq: Every morning | ORAL | 0 refills | Status: DC

## 2021-08-25 NOTE — Telephone Encounter (Signed)
Reviewed patient's  insurance formulary is appears that they will cover generic Nexium, Pepcid, and Aciphex. I have a feeling that it may be possible that these medications may still need a prior authorization since I have been noticing that [facility] has not been covering PPIs until they have tried and failed 2 over the counters.

## 2021-08-25 NOTE — Telephone Encounter (Signed)
Esomeprazole sent to patient's pharmacy. Patient has been contacted and advised that this has been sent in and if her insurance did not cover it that she could try Nexium or Omeprazole OTC.

## 2021-08-25 NOTE — Telephone Encounter (Signed)
Okay thanks. Why don't we try esomeprazole (generic nexium) instead - 40mg  / day for 30 days. Hopefully it is covered, if not she can get omeprazole or nexium OTC, 20mg  / capsule, and take it BID. Thanks

## 2021-08-25 NOTE — Addendum Note (Signed)
Addended by: Aleatha Borer on: 08/25/2021 01:44 PM   Modules accepted: Orders

## 2021-09-23 ENCOUNTER — Other Ambulatory Visit: Payer: Self-pay | Admitting: Gastroenterology

## 2021-10-06 NOTE — Progress Notes (Signed)
I, [name], LAT, ATC, am serving as scribe for Dr. Lynne Leader.  Virginia Jimenez is a 61 y.o. female who presents to [place] at [facility] today for f/u of B knee pain.  She was last seen by Dr. Georgina Snell on 01/31/21 and had B knee steroid injections and prior to that had B knee Synvisc injections w/ Dr. Tamala Julian on 04/08/20.  She takes Aleve and Tramadol prn.  Today, pt reports she is very happy we were able to get approval for the gel shots. She is interested in proceeding w/ bilat Orthovisc.  She starts work at 8:30 in the morning.  Diagnostic testing: B knee XR- 01/31/21; R knee XR- 12/26/16  Pertinent review of systems: No fevers or chills  Relevant historical information: LVH.  Hypertension.  Hyperlipidemia.  Diabetes.   Exam:  BP (!) 142/68    Pulse 80    Ht 5\' 5"  (1.651 m)    Wt 221 lb 3.2 oz (100.3 kg)    LMP 06/01/2012    BMI 36.81 kg/m  General: Well Developed, well nourished, and in no acute distress.   MSK: Right knee minimal effusion normal motion with crepitation. Left knee minimal effusion normal motion with crepitation.    Lab and Radiology Results  Orthovisc injection BL knees 1/3  Procedure: Real-time Ultrasound Guided Injection of right knee superior lateral patellar space Device: Philips Affiniti 50G Images permanently stored and available for review in PACS Verbal informed consent obtained.  Discussed risks and benefits of procedure. Warned about infection bleeding damage to structures skin hypopigmentation and fat atrophy among others. Patient expresses understanding and agreement Time-out conducted.   Noted no overlying erythema, induration, or other signs of local infection.   Skin prepped in a sterile fashion.   Local anesthesia: Topical Ethyl chloride.   With sterile technique and under real time ultrasound guidance: Orthovisc 30 mg injected into knee joint. Fluid seen entering the joint capsule.   Completed without difficulty    Advised to call if fevers/chills, erythema, induration, drainage, or persistent bleeding.   Images permanently stored and available for review in the ultrasound unit.  Impression: Technically successful ultrasound guided injection.    Procedure: Real-time Ultrasound Guided Injection of left knee superior lateral patellar space Device: Philips Affiniti 50G Images permanently stored and available for review in PACS Verbal informed consent obtained.  Discussed risks and benefits of procedure. Warned about infection bleeding damage to structures skin hypopigmentation and fat atrophy among others. Patient expresses understanding and agreement Time-out conducted.   Noted no overlying erythema, induration, or other signs of local infection.   Skin prepped in a sterile fashion.   Local anesthesia: Topical Ethyl chloride.   With sterile technique and under real time ultrasound guidance: Orthovisc 30 mg injected into knee joint. Fluid seen entering the joint capsule.   Completed without difficulty   Advised to call if fevers/chills, erythema, induration, drainage, or persistent bleeding.   Images permanently stored and available for review in the ultrasound unit.  Impression: Technically successful ultrasound guided injection.  Lot number: [id number] for both injections    Assessment and Plan: 61 y.o. female with  Bilateral knee pain due to DJD exacerbation.  Plan for bilateral Orthovisc series starting today. Patient will return next week for Orthovisc injection #2 of 3. We discussed and decided on starting the Orthovisc series today.    PDMP not reviewed this encounter. Orders Placed This Encounter  Procedures   [place] LIMITED JOINT SPACE STRUCTURES  LOW BILAT(NO LINKED CHARGES)    Standing Status:   Future    Number of Occurrences:   1    Standing Expiration Date:   04/08/2022    Order Specific Question:   Reason for Exam (SYMPTOM  OR DIAGNOSIS REQUIRED)    Answer:   bilateral knee pain     Order Specific Question:   Preferred imaging location?    Answer:   [place]   No orders of the defined types were placed in this encounter.    Discussed warning signs or symptoms. Please see discharge instructions. Patient expresses understanding.   The above documentation has been reviewed and is accurate and complete Lynne Leader, M.D.

## 2021-10-09 ENCOUNTER — Ambulatory Visit: Payer: Self-pay

## 2021-10-09 ENCOUNTER — Other Ambulatory Visit: Payer: Self-pay

## 2021-10-09 ENCOUNTER — Ambulatory Visit (INDEPENDENT_AMBULATORY_CARE_PROVIDER_SITE_OTHER): Payer: BC Managed Care – PPO | Admitting: Family Medicine

## 2021-10-09 ENCOUNTER — Telehealth: Payer: Self-pay

## 2021-10-09 VITALS — BP 142/68 | HR 80 | Ht 65.0 in | Wt 221.2 lb

## 2021-10-09 DIAGNOSIS — M170 Bilateral primary osteoarthritis of knee: Secondary | ICD-10-CM

## 2021-10-09 DIAGNOSIS — M25562 Pain in left knee: Secondary | ICD-10-CM | POA: Diagnosis not present

## 2021-10-09 DIAGNOSIS — M25561 Pain in right knee: Secondary | ICD-10-CM | POA: Diagnosis not present

## 2021-10-09 DIAGNOSIS — G8929 Other chronic pain: Secondary | ICD-10-CM

## 2021-10-09 NOTE — Telephone Encounter (Signed)
Pt was called to let her know we have not yet received a response from her insurance company regarding the pre-cert for orthovisc. Pt said she still wanted to be seen today for a possible steroid injection.

## 2021-10-09 NOTE — Patient Instructions (Addendum)
Thank you for coming in today.   You received gel injections in both of your knees today. Seek immediate medical attention if the joint becomes red, extremely painful, or is oozing fluid.   Please schedule the 2nd Orthovisc injection for next week and the 3rd injection for the week after that.

## 2021-10-13 ENCOUNTER — Other Ambulatory Visit: Payer: Self-pay | Admitting: Internal Medicine

## 2021-10-13 DIAGNOSIS — E119 Type 2 diabetes mellitus without complications: Secondary | ICD-10-CM

## 2021-10-16 ENCOUNTER — Ambulatory Visit (INDEPENDENT_AMBULATORY_CARE_PROVIDER_SITE_OTHER): Payer: BC Managed Care – PPO | Admitting: Family Medicine

## 2021-10-16 ENCOUNTER — Other Ambulatory Visit: Payer: Self-pay

## 2021-10-16 ENCOUNTER — Other Ambulatory Visit: Payer: Self-pay | Admitting: Internal Medicine

## 2021-10-16 ENCOUNTER — Encounter: Payer: Self-pay | Admitting: Family Medicine

## 2021-10-16 ENCOUNTER — Ambulatory Visit: Payer: Self-pay

## 2021-10-16 DIAGNOSIS — M170 Bilateral primary osteoarthritis of knee: Secondary | ICD-10-CM

## 2021-10-16 DIAGNOSIS — I10 Essential (primary) hypertension: Secondary | ICD-10-CM

## 2021-10-16 DIAGNOSIS — E119 Type 2 diabetes mellitus without complications: Secondary | ICD-10-CM

## 2021-10-16 DIAGNOSIS — G8929 Other chronic pain: Secondary | ICD-10-CM | POA: Diagnosis not present

## 2021-10-16 DIAGNOSIS — M25561 Pain in right knee: Secondary | ICD-10-CM | POA: Diagnosis not present

## 2021-10-16 DIAGNOSIS — I119 Hypertensive heart disease without heart failure: Secondary | ICD-10-CM

## 2021-10-16 NOTE — Patient Instructions (Signed)
Good to see you today.  You had B knee Orthovisc injections (1/3).  Call or go to the ER if you develop a large red swollen joint with extreme pain or oozing puss.   Please schedule 2 follow-up visits (1 visit/week for the next 2 weeks) to complete the series.

## 2021-10-16 NOTE — Progress Notes (Signed)
Virginia Jimenez presents to clinic today for Orthovisc injection bilateral knees 2/3  Procedure: Real-time Ultrasound Guided Injection of right knee superior lateral patellar space Device: Philips Affiniti 50G Images permanently stored and available for review in PACS Verbal informed consent obtained.  Discussed risks and benefits of procedure. Warned about infection bleeding damage to structures skin hypopigmentation and fat atrophy among others. Patient expresses understanding and agreement Time-out conducted.   Noted no overlying erythema, induration, or other signs of local infection.   Skin prepped in a sterile fashion.   Local anesthesia: Topical Ethyl chloride.   With sterile technique and under real time ultrasound guidance: Orthovisc 30 mg injected into knee joint. Fluid seen entering the joint capsule.   Completed without difficulty   Advised to call if fevers/chills, erythema, induration, drainage, or persistent bleeding.   Images permanently stored and available for review in the ultrasound unit.  Impression: Technically successful ultrasound guided injection.   Procedure: Real-time Ultrasound Guided Injection of left knee superior lateral patellar space Device: Philips Affiniti 50G Images permanently stored and available for review in PACS Verbal informed consent obtained.  Discussed risks and benefits of procedure. Warned about infection bleeding damage to structures skin hypopigmentation and fat atrophy among others. Patient expresses understanding and agreement Time-out conducted.   Noted no overlying erythema, induration, or other signs of local infection.   Skin prepped in a sterile fashion.   Local anesthesia: Topical Ethyl chloride.   With sterile technique and under real time ultrasound guidance: Orthovisc 30 mg injected into knee joint. Fluid seen entering the joint capsule.   Completed without difficulty   Advised to call if fevers/chills, erythema, induration, drainage, or  persistent bleeding.   Images permanently stored and available for review in the ultrasound unit.  Impression: Technically successful ultrasound guided injection.   Lot number: [id number] for both injections  Return in 1 week for Orthovisc injection bilateral knees 3/3

## 2021-10-18 ENCOUNTER — Ambulatory Visit: Payer: BC Managed Care – PPO | Admitting: Internal Medicine

## 2021-10-20 ENCOUNTER — Other Ambulatory Visit: Payer: Self-pay | Admitting: Internal Medicine

## 2021-10-20 DIAGNOSIS — I119 Hypertensive heart disease without heart failure: Secondary | ICD-10-CM

## 2021-10-20 DIAGNOSIS — I10 Essential (primary) hypertension: Secondary | ICD-10-CM

## 2021-10-23 ENCOUNTER — Other Ambulatory Visit: Payer: Self-pay

## 2021-10-23 ENCOUNTER — Ambulatory Visit: Payer: Self-pay

## 2021-10-23 ENCOUNTER — Ambulatory Visit (INDEPENDENT_AMBULATORY_CARE_PROVIDER_SITE_OTHER): Payer: BC Managed Care – PPO | Admitting: Family Medicine

## 2021-10-23 DIAGNOSIS — M25561 Pain in right knee: Secondary | ICD-10-CM | POA: Diagnosis not present

## 2021-10-23 DIAGNOSIS — M25562 Pain in left knee: Secondary | ICD-10-CM | POA: Diagnosis not present

## 2021-10-23 DIAGNOSIS — M170 Bilateral primary osteoarthritis of knee: Secondary | ICD-10-CM

## 2021-10-23 DIAGNOSIS — G8929 Other chronic pain: Secondary | ICD-10-CM

## 2021-10-23 NOTE — Progress Notes (Signed)
Virginia Jimenez presents to clinic today for Orthovisc injection of bilateral knees 3/3  Procedure: Real-time Ultrasound Guided Injection of right knee superior lateral patellar space Device: Philips Affiniti 50G Images permanently stored and available for review in PACS Verbal informed consent obtained.  Discussed risks and benefits of procedure. Warned about infection bleeding damage to structures skin hypopigmentation and fat atrophy among others. Patient expresses understanding and agreement Time-out conducted.   Noted no overlying erythema, induration, or other signs of local infection.   Skin prepped in a sterile fashion.   Local anesthesia: Topical Ethyl chloride.   With sterile technique and under real time ultrasound guidance: Orthovisc 30 mg injected into the knee joint. Fluid seen entering the joint capsule.   Completed without difficulty   Advised to call if fevers/chills, erythema, induration, drainage, or persistent bleeding.   Images permanently stored and available for review in the ultrasound unit.  Impression: Technically successful ultrasound guided injection.    Procedure: Real-time Ultrasound Guided Injection of left knee superior lateral patellar space Device: Philips Affiniti 50G Images permanently stored and available for review in PACS Verbal informed consent obtained.  Discussed risks and benefits of procedure. Warned about infection bleeding damage to structures skin hypopigmentation and fat atrophy among others. Patient expresses understanding and agreement Time-out conducted.   Noted no overlying erythema, induration, or other signs of local infection.   Skin prepped in a sterile fashion.   Local anesthesia: Topical Ethyl chloride.   With sterile technique and under real time ultrasound guidance: Orthovisc 30 mg injected into knee joint. Fluid seen entering the joint capsule.   Completed without difficulty    Advised to call if fevers/chills, erythema, induration,  drainage, or persistent bleeding.   Images permanently stored and available for review in the ultrasound unit.  Impression: Technically successful ultrasound guided injection.   Lot number: [id number] both injections  Return as needed

## 2021-10-23 NOTE — Patient Instructions (Signed)
Good to see  you today.  You had B knee Orthovisc injections.  Call or go to the ER if you develop a large red swollen joint with extreme pain or oozing puss.   Follow-up as needed.

## 2021-11-01 ENCOUNTER — Other Ambulatory Visit: Payer: Self-pay | Admitting: Internal Medicine

## 2021-11-01 DIAGNOSIS — M170 Bilateral primary osteoarthritis of knee: Secondary | ICD-10-CM

## 2021-11-09 ENCOUNTER — Telehealth: Payer: Self-pay

## 2021-11-09 NOTE — Telephone Encounter (Signed)
Virginia Jimenez (Key: [id number])  This request has received a Favorable outcome from [place].  Please keep in mind this is not a guarantee of payment. Eligibility and Benefit determinations will be made at the time of service.  Please note any additional information provided by [facility] at the bottom of the screen.

## 2021-11-22 ENCOUNTER — Other Ambulatory Visit: Payer: Self-pay | Admitting: Internal Medicine

## 2021-11-22 DIAGNOSIS — E118 Type 2 diabetes mellitus with unspecified complications: Secondary | ICD-10-CM

## 2021-12-04 ENCOUNTER — Encounter: Payer: Self-pay | Admitting: Internal Medicine

## 2021-12-04 ENCOUNTER — Other Ambulatory Visit: Payer: Self-pay

## 2021-12-04 ENCOUNTER — Ambulatory Visit: Payer: BC Managed Care – PPO | Admitting: Internal Medicine

## 2021-12-04 VITALS — BP 130/82 | HR 74 | Temp 98.2°F | Ht 65.0 in | Wt 217.0 lb

## 2021-12-04 DIAGNOSIS — Z794 Long term (current) use of insulin: Secondary | ICD-10-CM

## 2021-12-04 DIAGNOSIS — R200 Anesthesia of skin: Secondary | ICD-10-CM | POA: Diagnosis not present

## 2021-12-04 DIAGNOSIS — E118 Type 2 diabetes mellitus with unspecified complications: Secondary | ICD-10-CM

## 2021-12-04 DIAGNOSIS — I10 Essential (primary) hypertension: Secondary | ICD-10-CM

## 2021-12-04 DIAGNOSIS — E5111 Dry beriberi: Secondary | ICD-10-CM

## 2021-12-04 DIAGNOSIS — Z23 Encounter for immunization: Secondary | ICD-10-CM | POA: Diagnosis not present

## 2021-12-04 DIAGNOSIS — E119 Type 2 diabetes mellitus without complications: Secondary | ICD-10-CM

## 2021-12-04 DIAGNOSIS — K219 Gastro-esophageal reflux disease without esophagitis: Secondary | ICD-10-CM | POA: Diagnosis not present

## 2021-12-04 DIAGNOSIS — R202 Paresthesia of skin: Secondary | ICD-10-CM | POA: Diagnosis not present

## 2021-12-04 LAB — BASIC METABOLIC PANEL
BUN: 25 mg/dL — ABNORMAL HIGH (ref 6–23)
CO2: 32 mEq/L (ref 19–32)
Calcium: 9.9 mg/dL (ref 8.4–10.5)
Chloride: 102 mEq/L (ref 96–112)
Creatinine, Ser: 0.94 mg/dL (ref 0.40–1.20)
GFR: 66.1 mL/min (ref >60.00)
Glucose, Bld: 284 mg/dL — ABNORMAL HIGH (ref 70–99)
Potassium: 4.3 mEq/L (ref 3.5–5.1)
Sodium: 140 mEq/L (ref 135–145)

## 2021-12-04 LAB — VITAMIN B12: Vitamin B-12: >1504 pg/mL — ABNORMAL HIGH (ref 211–911)

## 2021-12-04 LAB — CBC WITH DIFFERENTIAL/PLATELET
Basophils Absolute: 0 10*3/uL (ref 0.0–0.1)
Basophils Relative: 0.9 % (ref 0.0–3.0)
Eosinophils Absolute: 0.1 10*3/uL (ref 0.0–0.7)
Eosinophils Relative: 1.8 % (ref 0.0–5.0)
HCT: 35.7 % — ABNORMAL LOW (ref 36.0–46.0)
Hemoglobin: 11.9 g/dL — ABNORMAL LOW (ref 12.0–15.0)
Lymphocytes Relative: 28.3 % (ref 12.0–46.0)
Lymphs Abs: 1.1 10*3/uL (ref 0.7–4.0)
MCHC: 33.2 g/dL (ref 30.0–36.0)
MCV: 92.6 fl (ref 78.0–100.0)
Monocytes Absolute: 0.3 10*3/uL (ref 0.1–1.0)
Monocytes Relative: 7.9 % (ref 3.0–12.0)
Neutro Abs: 2.3 10*3/uL (ref 1.4–7.7)
Neutrophils Relative %: 61.1 % (ref 43.0–77.0)
Platelets: 191 10*3/uL (ref 150.0–400.0)
RBC: 3.86 Mil/uL — ABNORMAL LOW (ref 3.87–5.11)
RDW: 12.3 % (ref 11.5–15.5)
WBC: 3.7 10*3/uL — ABNORMAL LOW (ref 4.0–10.5)

## 2021-12-04 LAB — HEMOGLOBIN A1C: Hgb A1c MFr Bld: 8.2 % — ABNORMAL HIGH (ref 4.6–6.5)

## 2021-12-04 LAB — FOLATE: Folate: 12 ng/mL (ref >5.9)

## 2021-12-04 MED ORDER — DEXCOM G6 TRANSMITTER MISC: 1.0000 | Freq: Every day | 5 refills | Status: DC

## 2021-12-04 MED ORDER — PEN NEEDLES 32G X 6 MM MISC: 2.0000 | Freq: Every day | 1 refills | Status: DC

## 2021-12-04 MED ORDER — OZEMPIC (0.25 OR 0.5 MG/DOSE) 2 MG/1.5ML ~~LOC~~ SOPN: 0.5000 mg | PEN_INJECTOR | SUBCUTANEOUS | 0 refills | Status: DC

## 2021-12-04 MED ORDER — DEXCOM G6 RECEIVER DEVI
1.0000 | Freq: Every day | 5 refills | Status: DC
Start: 2021-12-04 — End: 2022-07-17

## 2021-12-04 MED ORDER — DEXCOM G6 SENSOR MISC: 1.0000 | Freq: Every day | 5 refills | Status: DC

## 2021-12-04 NOTE — Patient Instructions (Signed)
Type 2 Diabetes Mellitus, Diagnosis, Adult ?Type 2 diabetes (type 2 diabetes mellitus) is a long-term, or chronic, disease. In type 2 diabetes, one or both of these problems may be present: ?The pancreas does not make enough of a hormone called insulin. ?Cells in the body do not respond properly to the insulin that the body makes (insulin resistance). ?Normally, insulin allows blood sugar (glucose) to enter cells in the body. The cells use glucose for energy. Insulin resistance or lack of insulin causes excess glucose to build up in the blood instead of going into cells. This causes high blood glucose (hyperglycemia).  ?What are the causes? ?The exact cause of type 2 diabetes is not known. ?What increases the risk? ?The following factors may make you more likely to develop this condition: ?Having a family member with type 2 diabetes. ?Being overweight or obese. ?Being inactive (sedentary). ?Having been diagnosed with insulin resistance. ?Having a history of prediabetes, diabetes when you were pregnant (gestational diabetes), or polycystic ovary syndrome (PCOS). ?What are the signs or symptoms? ?In the early stage of this condition, you may not have symptoms. Symptoms develop slowly and may include: ?Increased thirst or hunger. ?Increased urination. ?Unexplained weight loss. ?Tiredness (fatigue) or weakness. ?Vision changes, such as blurry vision. ?Dark patches on the skin. ?How is this diagnosed? ?This condition is diagnosed based on your symptoms, your medical history, a physical exam, and your blood glucose level. Your blood glucose may be checked with one or more of the following blood tests: ?A fasting blood glucose (FBG) test. You will not be allowed to eat (you will fast) for 8 hours or longer before a blood sample is taken. ?A random blood glucose test. This test checks blood glucose at any time of day regardless of when you ate. ?An A1C (hemoglobin A1C) blood test. This test provides information about blood  glucose levels over the previous 2-3 months. ?An oral glucose tolerance test (OGTT). This test measures your blood glucose at two times: ?After fasting. This is your baseline blood glucose level. ?Two hours after drinking a beverage that contains glucose. ?You may be diagnosed with type 2 diabetes if: ?Your fasting blood glucose level is 126 mg/dL (7.0 mmol/L) or higher. ?Your random blood glucose level is 200 mg/dL (11.1 mmol/L) or higher. ?Your A1C level is 6.5% or higher. ?Your oral glucose tolerance test result is higher than 200 mg/dL (11.1 mmol/L). ?These blood tests may be repeated to confirm your diagnosis. ?How is this treated? ?Your treatment may be managed by a specialist called an endocrinologist. Type 2 diabetes may be treated by following instructions from your health care provider about: ?Making dietary and lifestyle changes. These may include: ?Following a personalized nutrition plan that is developed by a registered dietitian. ?Exercising regularly. ?Finding ways to manage stress. ?Checking your blood glucose level as often as told. ?Taking diabetes medicines or insulin daily. This helps to keep your blood glucose levels in the healthy range. ?Taking medicines to help prevent complications from diabetes. Medicines may include: ?Aspirin. ?Medicine to lower cholesterol. ?Medicine to control blood pressure. ?Your health care provider will set treatment goals for you. Your goals will be based on your age, other medical conditions you have, and how you respond to diabetes treatment. Generally, the goal of treatment is to maintain the following blood glucose levels: ?Before meals: 80-130 mg/dL (4.4-7.2 mmol/L). ?After meals: below 180 mg/dL (10 mmol/L). ?A1C level: less than 7%. ?Follow these instructions at home: ?Questions to ask your health care provider ?  Consider asking the following questions: ?Should I meet with a certified diabetes care and education specialist? ?What diabetes medicines do I need,  and when should I take them? ?What equipment will I need to manage my diabetes at home? ?How often do I need to check my blood glucose? ?Where can I find a support group for people with diabetes? ?What number can I call if I have questions? ?When is my next appointment? ?General instructions ?Take over-the-counter and prescription medicines only as told by your health care provider. ?Keep all follow-up visits. This is important. ?Where to find more information ?For help and guidance and for more information about diabetes, please visit: ?American Diabetes Association (ADA): www.diabetes.org ?American Association of Diabetes Care and Education Specialists (ADCES): www.diabeteseducator.org ?International Diabetes Federation (IDF): www.idf.org ?Contact a health care provider if: ?Your blood glucose is at or above 240 mg/dL (13.3 mmol/L) for 2 days in a row. ?You have been sick or have had a fever for 2 days or longer, and you are not getting better. ?You have any of the following problems for more than 6 hours: ?You cannot eat or drink. ?You have nausea and vomiting. ?You have diarrhea. ?Get help right away if: ?You have severe hypoglycemia. This means your blood glucose is lower than 54 mg/dL (3.0 mmol/L). ?You become confused or you have trouble thinking clearly. ?You have difficulty breathing. ?You have moderate or large ketone levels in your urine. ?These symptoms may represent a serious problem that is an emergency. Do not wait to see if the symptoms will go away. Get medical help right away. Call your local emergency services (911 in the [place] Do not drive yourself to the hospital. ?Summary ?Type 2 diabetes mellitus is a long-term, or chronic, disease. In type 2 diabetes, the pancreas does not make enough of a hormone called insulin, or cells in the body do not respond properly to insulin that the body makes. ?This condition is treated by making dietary and lifestyle changes and taking diabetes medicines or  insulin. ?Your health care provider will set treatment goals for you. Your goals will be based on your age, other medical conditions you have, and how you respond to diabetes treatment. ?Keep all follow-up visits. This is important. ?This information is not intended to replace advice given to you by your health care provider. Make sure you discuss any questions you have with your health care provider. ?Document Revised: 11/28/2020 Document Reviewed: 11/28/2020 ?Elsevier Patient Education ? 2022 [employer] ? ?

## 2021-12-04 NOTE — Progress Notes (Signed)
Subjective:  Patient ID: Virginia Jimenez, female    DOB: [birth year]  Age: 61 y.o. MRN: [id number]  CC: Diabetes and Hypertension  This visit occurred during the SARS-CoV-2 public health emergency.  Safety protocols were in place, including screening questions prior to the visit, additional usage of staff PPE, and extensive cleaning of exam room while observing appropriate contact time as indicated for disinfecting solutions.    HPI Virginia Jimenez presents for f/up -  She complains of a 3 month hx of symmetrical N/T in BU and LE's. The abnormal sensations interfere with her sleep.  She denies neck or back pain, visual disturbance, weakness, slurred speech, or ataxia.  She is active and denies chest pain, shortness of breath, diaphoresis, dizziness, lightheadedness, or edema.  Outpatient Medications Prior to Visit  Medication Sig Dispense Refill   blood glucose meter kit and supplies KIT Use to check blood sugar daily. DX: E11.8 1 each 0   carvedilol (COREG) 6.25 MG tablet TAKE ONE TABLET BY MOUTH TWICE A DAY WITH MEALS 180 tablet 0   esomeprazole (NEXIUM) 40 MG capsule TAKE 1 TABLET BY MOUTH IN THE MORNING 30 capsule 5   Glucagon (GVOKE HYPOPEN 2-PACK) 1 MG/0.2ML SOAJ Inject 1 Act into the skin daily as needed. 2 mL 5   glucose blood (COOL BLOOD GLUCOSE TEST STRIPS) test strip Use to check blood sugar twice daily. DX: E11.8 200 each 3   indapamide (LOZOL) 1.25 MG tablet TAKE ONE TABLET BY MOUTH DAILY 90 tablet 0   irbesartan (AVAPRO) 300 MG tablet TAKE ONE TABLET BY MOUTH DAILY 90 tablet 0   metFORMIN (GLUCOPHAGE-XR) 750 MG 24 hr tablet TAKE TWO TABLETS BY MOUTH DAILY WITH BREAKFAST 180 tablet 0   rosuvastatin (CRESTOR) 20 MG tablet Take 1 tablet (20 mg total) by mouth daily. 90 tablet 1   SEMGLEE, YFGN, 100 UNIT/ML Pen INJECT 30 UNITS INTO THE SKIN DAILY 15 mL 0   traMADol (ULTRAM) 50 MG tablet TAKE ONE TABLET BY MOUTH EVERY 6 HOURS AS NEEDED FOR SEVERE PAIN 75 tablet 0   Insulin  Pen Needle (PEN NEEDLES) 32G X 6 MM MISC Use to inject insulin daily. 90 each 0   irbesartan (AVAPRO) 150 MG tablet TAKE ONE TABLET BY MOUTH DAILY 90 tablet 0   Ascorbic Acid (VITAMIN C) 100 MG tablet Take 100 mg by mouth daily. (Patient not taking: Reported on 05/19/2021)     ondansetron (ZOFRAN-ODT) 4 MG disintegrating tablet Take 1 tablet (4 mg total) by mouth every 8 (eight) hours as needed for nausea or vomiting. (Patient not taking: Reported on 05/19/2021) 30 tablet 1   No facility-administered medications prior to visit.    ROS Review of Systems  Constitutional: Negative.  Negative for appetite change, diaphoresis, fatigue, fever and unexpected weight change.  HENT: Negative.    Eyes: Negative.   Respiratory: Negative.  Negative for cough, chest tightness, shortness of breath and wheezing.   Cardiovascular:  Negative for chest pain, palpitations and leg swelling.  Gastrointestinal:  Negative for abdominal pain, blood in stool, constipation, diarrhea, nausea and vomiting.  Endocrine: Negative.   Genitourinary: Negative.   Musculoskeletal: Negative.  Negative for arthralgias, back pain, gait problem and myalgias.  Neurological:  Positive for numbness. Negative for dizziness, facial asymmetry, weakness and headaches.  Hematological:  Negative for adenopathy. Does not bruise/bleed easily.  Psychiatric/Behavioral: Negative.     Objective:  BP 130/82 (BP Location: Right Arm, Patient Position: Sitting, Cuff Size: Large)   Pulse  74   Temp 98.2 F (36.8 C) (Oral)   Ht 5\' 5"  (1.651 m)   Wt 217 lb (98.4 kg)   LMP 06/01/2012   SpO2 96%   BMI 36.11 kg/m   BP Readings from Last 3 Encounters:  12/04/21 130/82  10/09/21 (!) 142/68  05/19/21 (!) 142/68    Wt Readings from Last 3 Encounters:  12/04/21 217 lb (98.4 kg)  10/09/21 221 lb 3.2 oz (100.3 kg)  05/19/21 213 lb (96.6 kg)    Physical Exam Vitals reviewed.  HENT:     Nose: Nose normal.     Mouth/Throat:     Mouth: Mucous  membranes are moist.  Eyes:     General: No scleral icterus.    Conjunctiva/sclera: Conjunctivae normal.  Cardiovascular:     Rate and Rhythm: Normal rate and regular rhythm.     Heart sounds: No murmur heard. Pulmonary:     Effort: Pulmonary effort is normal.     Breath sounds: No stridor. No wheezing, rhonchi or rales.  Abdominal:     General: Abdomen is flat.     Palpations: There is no mass.     Tenderness: There is no abdominal tenderness. There is no guarding or rebound.     Hernia: No hernia is present.  Musculoskeletal:     Cervical back: Neck supple.  Lymphadenopathy:     Cervical: No cervical adenopathy.  Skin:    General: Skin is warm.     Findings: No lesion or rash.  Neurological:     General: No focal deficit present.     Mental Status: She is alert and oriented to person, place, and time. Mental status is at baseline.     Cranial Nerves: Cranial nerves 2-12 are intact.     Sensory: Sensation is intact.     Motor: Motor function is intact. No weakness.     Coordination: Coordination is intact. Coordination normal.     Gait: Gait is intact. Gait normal.     Deep Tendon Reflexes: Reflexes normal.     Reflex Scores:      Tricep reflexes are 1+ on the right side and 1+ on the left side.      Bicep reflexes are 2+ on the right side and 2+ on the left side.      Brachioradialis reflexes are 2+ on the right side and 2+ on the left side.      Patellar reflexes are 1+ on the right side and 1+ on the left side.      Achilles reflexes are 0 on the right side and 0 on the left side. Psychiatric:        Mood and Affect: Mood normal.        Behavior: Behavior normal.    Lab Results  Component Value Date   WBC 3.7 (L) 12/04/2021   HGB 11.9 (L) 12/04/2021   HCT 35.7 (L) 12/04/2021   PLT 191.0 12/04/2021   GLUCOSE 284 (H) 12/04/2021   CHOL 123 04/18/2021   TRIG 41.0 04/18/2021   HDL 58.60 04/18/2021   LDLDIRECT 138.6 04/15/2013   LDLCALC 56 04/18/2021   ALT 21  02/09/2021   AST 14 02/09/2021   NA 140 12/04/2021   K 4.3 12/04/2021   CL 102 12/04/2021   CREATININE 0.94 12/04/2021   BUN 25 (H) 12/04/2021   CO2 32 12/04/2021   TSH 0.55 10/12/2020   HGBA1C 8.2 (H) 12/04/2021   MICROALBUR <0.7 04/18/2021    MM DIAG  BREAST TOMO BILATERAL  Result Date: 08/04/2021 CLINICAL DATA:  61 year old female for delayed 1 year follow-up of LEFT breast calcifications (now 2 years) and for annual bilateral mammogram. EXAM: DIGITAL DIAGNOSTIC BILATERAL MAMMOGRAM WITH CAD AND TOMO COMPARISON:  Previous exam(s). ACR Breast Density Category b: There are scattered areas of fibroglandular density. FINDINGS: Full field views of both breast and magnification views of the LEFT breast are performed. The 2 groups of UPPER-OUTER LEFT breast calcifications are again noted with decreasing number of calcifications in each group. No new or suspicious findings are noted within either breast. Mammographic images were processed with CAD. IMPRESSION: 1. Decreased number of UPPER-OUTER LEFT breast calcifications without new or suspicious abnormality, considered benign given follow-up for 2 years. 2. No evidence of breast malignancy within either breast. RECOMMENDATION: Bilateral screening mammogram in 1 year. I have discussed the findings and recommendations with the patient. If applicable, a reminder letter will be sent to the patient regarding the next appointment. BI-RADS CATEGORY  2: Benign. Electronically Signed   By: Harmon Pier M.D.   On: 08/04/2021 09:01   Assessment & Plan:   Virginia Jimenez was seen today for diabetes and hypertension.  Diagnoses and all orders for this visit:  Essential hypertension, benign- Her blood pressure is well controlled. -     Basic metabolic panel; Future -     CBC with Differential/Platelet; Future -     CBC with Differential/Platelet -     Basic metabolic panel  Gastroesophageal reflux disease without esophagitis -     CBC with Differential/Platelet;  Future -     CBC with Differential/Platelet  Insulin-requiring or dependent type II diabetes mellitus (HCC)- Her A1c remains too high.  Will add a GLP agonist to her current regimen. -     Basic metabolic panel; Future -     Hemoglobin A1c; Future -     HM Diabetes Foot Exam -     Ambulatory referral to Ophthalmology -     Hemoglobin A1c -     Basic metabolic panel -     Semaglutide,0.25 or 0.5MG /DOS, (OZEMPIC, 0.25 OR 0.5 MG/DOSE,) 2 MG/1.5ML SOPN; Inject 0.5 mg into the skin once a week. -     Insulin Pen Needle (PEN NEEDLES) 32G X 6 MM MISC; Inject 2 Act into the skin daily. Use to inject insulin daily. -     Continuous Blood Gluc Receiver (DEXCOM G6 RECEIVER) DEVI; 1 Act by Does not apply route daily. -     Continuous Blood Gluc Transmit (DEXCOM G6 TRANSMITTER) MISC; 1 Act by Does not apply route daily. -     Continuous Blood Gluc Sensor (DEXCOM G6 SENSOR) MISC; 1 Act by Does not apply route daily.  Numbness and tingling of upper and lower extremities of both sides- Her thiamine level is 8.  I think this may be causing the neuropathy.  I have asked her to start a thiamine supplement. -     Vitamin B12; Future -     Vitamin B1; Future -     Folate; Future -     Folate -     Vitamin B1 -     Vitamin B12  Type II diabetes mellitus with manifestations (HCC)  Thiamine deficiency neuropathy -     thiamine (VITAMIN B-1) 50 MG tablet; Take 1 tablet (50 mg total) by mouth daily.  Other orders -     Varicella-zoster vaccine IM (Shingrix)   I have discontinued Drexel Iha. Tasker's vitamin C and  ondansetron. I have also changed her Pen Needles. Additionally, I am having her start on Ozempic (0.25 or 0.5 MG/DOSE), Dexcom G6 Receiver, Dexcom G6 Transmitter, Dexcom G6 Sensor, and thiamine. Lastly, I am having her maintain her blood glucose meter kit and supplies, Cool Blood Glucose Test Strips, Gvoke HypoPen 2-Pack, rosuvastatin, esomeprazole, Semglee (yfgn), irbesartan, indapamide, carvedilol,  traMADol, and metFORMIN.  Meds ordered this encounter  Medications   Semaglutide,0.25 or 0.5MG /DOS, (OZEMPIC, 0.25 OR 0.5 MG/DOSE,) 2 MG/1.5ML SOPN    Sig: Inject 0.5 mg into the skin once a week.    Dispense:  3 mL    Refill:  0   Insulin Pen Needle (PEN NEEDLES) 32G X 6 MM MISC    Sig: Inject 2 Act into the skin daily. Use to inject insulin daily.    Dispense:  100 each    Refill:  1    Substitutions allowed.   Continuous Blood Gluc Receiver (DEXCOM G6 RECEIVER) DEVI    Sig: 1 Act by Does not apply route daily.    Dispense:  1 each    Refill:  5   Continuous Blood Gluc Transmit (DEXCOM G6 TRANSMITTER) MISC    Sig: 1 Act by Does not apply route daily.    Dispense:  1 each    Refill:  5   Continuous Blood Gluc Sensor (DEXCOM G6 SENSOR) MISC    Sig: 1 Act by Does not apply route daily.    Dispense:  1 each    Refill:  5   thiamine (VITAMIN B-1) 50 MG tablet    Sig: Take 1 tablet (50 mg total) by mouth daily.    Dispense:  90 tablet    Refill:  1     Follow-up: Return in about 4 months (around 04/05/2022).  Sanda Linger, MD

## 2021-12-09 ENCOUNTER — Encounter: Payer: Self-pay | Admitting: Internal Medicine

## 2021-12-09 DIAGNOSIS — E5111 Dry beriberi: Secondary | ICD-10-CM | POA: Insufficient documentation

## 2021-12-09 LAB — VITAMIN B1: Vitamin B1 (Thiamine): 8 nmol/L (ref 8–30)

## 2021-12-09 MED ORDER — VITAMIN B-1 50 MG PO TABS: 50.0000 mg | ORAL_TABLET | Freq: Every day | ORAL | 1 refills | Status: DC

## 2021-12-13 ENCOUNTER — Telehealth: Payer: Self-pay

## 2021-12-13 NOTE — Telephone Encounter (Signed)
Key:  [id number] ? ?Approved ?Effective from 12/13/2021 through 12/12/2022. ?

## 2022-01-12 ENCOUNTER — Other Ambulatory Visit: Payer: Self-pay | Admitting: Internal Medicine

## 2022-01-12 ENCOUNTER — Telehealth: Payer: Self-pay | Admitting: Internal Medicine

## 2022-01-12 DIAGNOSIS — E119 Type 2 diabetes mellitus without complications: Secondary | ICD-10-CM

## 2022-01-12 MED ORDER — OZEMPIC (0.25 OR 0.5 MG/DOSE) 2 MG/1.5ML ~~LOC~~ SOPN: 2.0000 mg | PEN_INJECTOR | SUBCUTANEOUS | 1 refills | Status: DC

## 2022-01-12 NOTE — Telephone Encounter (Signed)
Pharmacy stated that the dose pen Rx'd would not suffice with the quantity prescribed. Wanted verbal to do go up to the next pen to match the directions written of "inject '2mg'$  into the skin once daily" and pt could receive a 90d supply. ? ?

## 2022-01-12 NOTE — Telephone Encounter (Signed)
Pharmacy is calling for clarification on dose for Ozempic. Requests call back at [phone]. ?

## 2022-01-17 DIAGNOSIS — E113293 Type 2 diabetes mellitus with mild nonproliferative diabetic retinopathy without macular edema, bilateral: Secondary | ICD-10-CM | POA: Diagnosis not present

## 2022-01-17 DIAGNOSIS — H5213 Myopia, bilateral: Secondary | ICD-10-CM | POA: Diagnosis not present

## 2022-01-17 DIAGNOSIS — H2513 Age-related nuclear cataract, bilateral: Secondary | ICD-10-CM | POA: Diagnosis not present

## 2022-01-17 LAB — HM DIABETES EYE EXAM

## 2022-01-22 NOTE — Progress Notes (Signed)
? ?I, [name], LAT, ATC, am serving as scribe for Dr. Lynne Leader. ? ?Virginia Jimenez is a 61 y.o. female who presents to [facility] today for f/u of chronic B knee pain.  She was last seen by Dr. Georgina Snell on 10/23/21 for her final round of B knee Orthovisc injections.  Prior to that she had B knee steroid injections on 01/31/21.  Today, pt reports bilat knee are "terrible." Pt notes L>R. No new injury. ? ?She is a Freight forwarder at [employer].  Her job does require her to walk quite a bit and organize the clothing. ? ?Diagnostic testing: R and L knee XR- 02/01/21 ? ?Pertinent review of systems: No fevers or chills ? ?Relevant historical information: Diabetes and obesity. ? ? ?Exam:  ?BP 124/82   Pulse 70   Ht '5\' 5"'$  (1.651 m)   Wt 210 lb 12.8 oz (95.6 kg)   LMP 06/01/2012   SpO2 97%   BMI 35.08 kg/m?  ?General: Well Developed, well nourished, and in no acute distress.  ? ?MSK:  ?Bilateral knees mild effusion normal motion with crepitation.  Tender palpation medial joint line. ? ? ? ?Lab and Radiology Results ? ?Procedure: Real-time Ultrasound Guided Injection of right knee superior lateral patellar space ?Device: Philips Affiniti 50G ?Images permanently stored and available for review in PACS ?Verbal informed consent obtained.  Discussed risks and benefits of procedure. Warned about infection, bleeding, hyperglycemia damage to structures among others. ?Patient expresses understanding and agreement ?Time-out conducted.   ?Noted no overlying erythema, induration, or other signs of local infection.   ?Skin prepped in a sterile fashion.   ?Local anesthesia: Topical Ethyl chloride.   ?With sterile technique and under real time ultrasound guidance: 40 mg of Kenalog and 2 mL of Marcaine injected into knee joint. Fluid seen entering the joint capsule.   ?Completed without difficulty   ?Pain immediately resolved suggesting accurate placement of the medication.   ?Advised to call if  fevers/chills, erythema, induration, drainage, or persistent bleeding.   ?Images permanently stored and available for review in the ultrasound unit.  ?Impression: Technically successful ultrasound guided injection. ? ? ? ?Procedure: Real-time Ultrasound Guided Injection of left knee superior lateral patellar space ?Device: Philips Affiniti 50G ?Images permanently stored and available for review in PACS ?Verbal informed consent obtained.  Discussed risks and benefits of procedure. Warned about infection, bleeding, hyperglycemia damage to structures among others. ?Patient expresses understanding and agreement ?Time-out conducted.   ?Noted no overlying erythema, induration, or other signs of local infection.   ?Skin prepped in a sterile fashion.   ?Local anesthesia: Topical Ethyl chloride.   ?With sterile technique and under real time ultrasound guidance: 40 mg of Kenalog and 2 mL of Marcaine injected into knee joint. Fluid seen entering the joint capsule.   ?Completed without difficulty   ?Pain immediately resolved suggesting accurate placement of the medication.   ?Advised to call if fevers/chills, erythema, induration, drainage, or persistent bleeding.   ?Images permanently stored and available for review in the ultrasound unit.  ?Impression: Technically successful ultrasound guided injection. ? ?EXAM: ?RIGHT KNEE 3 VIEWS ?  ?COMPARISON:  December 26, 2016. ?  ?FINDINGS: ?Moderate degenerative change involving the medial and patellofemoral ?compartments with joint space loss and marginal osteophytes, mildly ?progressed from the prior. Small joint effusion. No radiographic ?evidence of acute fracture or dislocation. ?  ?IMPRESSION: ?1. Moderate degenerative change of the medial and patellofemoral ?compartments, mildly progressed from the prior. ?2. Small  joint effusion. ?  ?  ?Electronically Signed ?  By: Margaretha Sheffield MD ?  On: 02/01/2021 13:24 ? ?EXAM: ?LEFT KNEE 3 VIEWS ?  ?COMPARISON:  None. ?   ?FINDINGS: ?Tricompartmental degenerative change most severe in the medial joint ?compartment. Severe joint space narrowing and spurring medially. ?Mild spurring laterally and involving the patella. Small joint ?effusion. Negative for fracture. ?  ?IMPRESSION: ?Tricompartmental degenerative change most severe in the medial ?compartment. Small effusion. ?  ?  ?Electronically Signed ?  By: Franchot Gallo M.D. ?  On: 02/01/2021 13:20 ?I, Lynne Leader, personally (independently) visualized and performed the interpretation of the images attached in this note. ? ? ?Lab Results  ?Component Value Date  ? HGBA1C 8.2 (H) 12/04/2021  ? ? ? ? ? ? ?Assessment and Plan: ?61 y.o. female with bilateral knee pain due to exacerbation of DJD.  She has moderate to severe medial compartment DJD seen on x-rays about a year ago.  She has had recent trial of hyaluronic acid injections which unfortunately did not work well enough.  Spent time today talking about total knee replacement.  I think she is going to be due for TKR at some point in the medium future.  Planning now for this will be helpful. ? ?For now plan for steroid injection.  She should work on weight loss and good diabetes control.  She started on Ozempic now which will help.  Continued quad strengthening will help.  She will let me know how these injections work and I am happy to refer her to orthopedic surgery at anytime in the near future. ? ? ?PDMP not reviewed this encounter. ?Orders Placed This Encounter  ?Procedures  ? [place] LIMITED JOINT SPACE STRUCTURES LOW BILAT(NO LINKED CHARGES)  ?  Order Specific Question:   Reason for Exam (SYMPTOM  OR DIAGNOSIS REQUIRED)  ?  Answer:   bilateral knee pain  ?  Order Specific Question:   Preferred imaging location?  ?  Answer:   [place]  ? ?No orders of the defined types were placed in this encounter. ? ? ? ?Discussed warning signs or symptoms. Please see discharge instructions. Patient expresses  understanding. ? ? ?The above documentation has been reviewed and is accurate and complete Lynne Leader, M.D. ? ? ?

## 2022-01-23 ENCOUNTER — Ambulatory Visit: Payer: BC Managed Care – PPO | Admitting: Family Medicine

## 2022-01-23 ENCOUNTER — Ambulatory Visit: Payer: Self-pay

## 2022-01-23 VITALS — BP 124/82 | HR 70 | Ht 65.0 in | Wt 210.8 lb

## 2022-01-23 DIAGNOSIS — M25562 Pain in left knee: Secondary | ICD-10-CM | POA: Diagnosis not present

## 2022-01-23 DIAGNOSIS — M25561 Pain in right knee: Secondary | ICD-10-CM

## 2022-01-23 DIAGNOSIS — M170 Bilateral primary osteoarthritis of knee: Secondary | ICD-10-CM

## 2022-01-23 DIAGNOSIS — G8929 Other chronic pain: Secondary | ICD-10-CM

## 2022-01-23 NOTE — Patient Instructions (Addendum)
Thank you for coming in today.  ? ?You received steroid injections in both of your knees today. Seek immediate medical attention if the joint becomes red, extremely painful, or is oozing fluid.  ? ?Recheck back as needed ? ? ?

## 2022-01-26 ENCOUNTER — Other Ambulatory Visit: Payer: Self-pay | Admitting: Internal Medicine

## 2022-01-26 DIAGNOSIS — I119 Hypertensive heart disease without heart failure: Secondary | ICD-10-CM

## 2022-01-26 DIAGNOSIS — I10 Essential (primary) hypertension: Secondary | ICD-10-CM

## 2022-02-15 ENCOUNTER — Other Ambulatory Visit: Payer: Self-pay | Admitting: Gastroenterology

## 2022-02-18 ENCOUNTER — Other Ambulatory Visit: Payer: Self-pay | Admitting: Gastroenterology

## 2022-03-06 ENCOUNTER — Other Ambulatory Visit: Payer: Self-pay | Admitting: Internal Medicine

## 2022-03-06 DIAGNOSIS — E119 Type 2 diabetes mellitus without complications: Secondary | ICD-10-CM

## 2022-04-05 ENCOUNTER — Ambulatory Visit: Payer: BC Managed Care – PPO | Admitting: Internal Medicine

## 2022-04-05 ENCOUNTER — Encounter: Payer: Self-pay | Admitting: Internal Medicine

## 2022-04-05 VITALS — BP 124/68 | HR 80 | Temp 98.1°F | Ht 65.0 in | Wt 193.0 lb

## 2022-04-05 DIAGNOSIS — Z124 Encounter for screening for malignant neoplasm of cervix: Secondary | ICD-10-CM

## 2022-04-05 DIAGNOSIS — Z23 Encounter for immunization: Secondary | ICD-10-CM

## 2022-04-05 DIAGNOSIS — E119 Type 2 diabetes mellitus without complications: Secondary | ICD-10-CM

## 2022-04-05 DIAGNOSIS — Z794 Long term (current) use of insulin: Secondary | ICD-10-CM

## 2022-04-05 DIAGNOSIS — I10 Essential (primary) hypertension: Secondary | ICD-10-CM

## 2022-04-05 DIAGNOSIS — I119 Hypertensive heart disease without heart failure: Secondary | ICD-10-CM | POA: Diagnosis not present

## 2022-04-05 DIAGNOSIS — M170 Bilateral primary osteoarthritis of knee: Secondary | ICD-10-CM

## 2022-04-05 DIAGNOSIS — E785 Hyperlipidemia, unspecified: Secondary | ICD-10-CM | POA: Diagnosis not present

## 2022-04-05 DIAGNOSIS — E5111 Dry beriberi: Secondary | ICD-10-CM | POA: Diagnosis not present

## 2022-04-05 DIAGNOSIS — E118 Type 2 diabetes mellitus with unspecified complications: Secondary | ICD-10-CM

## 2022-04-05 DIAGNOSIS — N1831 Chronic kidney disease, stage 3a: Secondary | ICD-10-CM

## 2022-04-05 LAB — CBC WITH DIFFERENTIAL/PLATELET
Basophils Absolute: 0 10*3/uL (ref 0.0–0.1)
Basophils Relative: 0.8 % (ref 0.0–3.0)
Eosinophils Absolute: 0.1 10*3/uL (ref 0.0–0.7)
Eosinophils Relative: 1.6 % (ref 0.0–5.0)
HCT: 31.7 % — ABNORMAL LOW (ref 36.0–46.0)
Hemoglobin: 10.6 g/dL — ABNORMAL LOW (ref 12.0–15.0)
Lymphocytes Relative: 25.3 % (ref 12.0–46.0)
Lymphs Abs: 0.9 10*3/uL (ref 0.7–4.0)
MCHC: 33.3 g/dL (ref 30.0–36.0)
MCV: 94.1 fl (ref 78.0–100.0)
Monocytes Absolute: 0.5 10*3/uL (ref 0.1–1.0)
Monocytes Relative: 13.9 % — ABNORMAL HIGH (ref 3.0–12.0)
Neutro Abs: 2 10*3/uL (ref 1.4–7.7)
Neutrophils Relative %: 58.4 % (ref 43.0–77.0)
Platelets: 171 10*3/uL (ref 150.0–400.0)
RBC: 3.36 Mil/uL — ABNORMAL LOW (ref 3.87–5.11)
RDW: 12.7 % (ref 11.5–15.5)
WBC: 3.4 10*3/uL — ABNORMAL LOW (ref 4.0–10.5)

## 2022-04-05 LAB — BASIC METABOLIC PANEL
BUN: 37 mg/dL — ABNORMAL HIGH (ref 6–23)
CO2: 30 mEq/L (ref 19–32)
Calcium: 9.7 mg/dL (ref 8.4–10.5)
Chloride: 100 mEq/L (ref 96–112)
Creatinine, Ser: 1.23 mg/dL — ABNORMAL HIGH (ref 0.40–1.20)
GFR: 47.76 mL/min — ABNORMAL LOW (ref >60.00)
Glucose, Bld: 98 mg/dL (ref 70–99)
Potassium: 3.4 mEq/L — ABNORMAL LOW (ref 3.5–5.1)
Sodium: 139 mEq/L (ref 135–145)

## 2022-04-05 LAB — HEPATIC FUNCTION PANEL
ALT: 21 U/L (ref 0–35)
AST: 19 U/L (ref 0–37)
Albumin: 4.3 g/dL (ref 3.5–5.2)
Alkaline Phosphatase: 52 U/L (ref 39–117)
Bilirubin, Direct: 0.1 mg/dL (ref 0.0–0.3)
Total Bilirubin: 0.6 mg/dL (ref 0.2–1.2)
Total Protein: 7.4 g/dL (ref 6.0–8.3)

## 2022-04-05 LAB — HEMOGLOBIN A1C: Hgb A1c MFr Bld: 6.6 % — ABNORMAL HIGH (ref 4.6–6.5)

## 2022-04-05 LAB — URINALYSIS, ROUTINE W REFLEX MICROSCOPIC
Bilirubin Urine: NEGATIVE
Hgb urine dipstick: NEGATIVE
Ketones, ur: NEGATIVE
Leukocytes,Ua: NEGATIVE
Nitrite: NEGATIVE
Specific Gravity, Urine: 1.02 (ref 1.000–1.030)
Urine Glucose: NEGATIVE
Urobilinogen, UA: 0.2 (ref 0.0–1.0)
pH: 6 (ref 5.0–8.0)

## 2022-04-05 LAB — LIPID PANEL
Cholesterol: 102 mg/dL (ref 0–200)
HDL: 46.1 mg/dL (ref >39.00)
LDL Cholesterol: 45 mg/dL (ref 0–99)
NonHDL: 55.93
Total CHOL/HDL Ratio: 2
Triglycerides: 56 mg/dL (ref 0.0–149.0)
VLDL: 11.2 mg/dL (ref 0.0–40.0)

## 2022-04-05 LAB — MICROALBUMIN / CREATININE URINE RATIO
Creatinine,U: 182.5 mg/dL
Microalb Creat Ratio: 0.8 mg/g (ref 0.0–30.0)
Microalb, Ur: 1.5 mg/dL (ref 0.0–1.9)

## 2022-04-05 MED ORDER — GVOKE HYPOPEN 2-PACK 1 MG/0.2ML ~~LOC~~ SOAJ: 1.0000 | Freq: Every day | SUBCUTANEOUS | 5 refills | Status: DC | PRN

## 2022-04-05 MED ORDER — TRAMADOL HCL 50 MG PO TABS: 50.0000 mg | ORAL_TABLET | Freq: Four times a day (QID) | ORAL | 3 refills | Status: AC | PRN

## 2022-04-05 MED ORDER — BOOSTRIX 5-2.5-18.5 LF-MCG/0.5 IM SUSP: 0.5000 mL | Freq: Once | INTRAMUSCULAR | 0 refills | Status: AC

## 2022-04-05 MED ORDER — VITAMIN B-1 50 MG PO TABS: 50.0000 mg | ORAL_TABLET | Freq: Every day | ORAL | 1 refills | Status: DC

## 2022-04-05 MED ORDER — OZEMPIC (2 MG/DOSE) 8 MG/3ML ~~LOC~~ SOPN: 2.0000 mg | PEN_INJECTOR | SUBCUTANEOUS | 0 refills | Status: DC

## 2022-04-05 NOTE — Progress Notes (Signed)
Subjective:  Patient ID: Virginia Jimenez, female    DOB: 1961/05/21  Age: 60 y.o. MRN: [id number]  CC: Anemia, Hypertension, Osteoarthritis, Hyperlipidemia, and Diabetes   HPI Virginia Jimenez presents for f/up -  She is active and denies chest pain, shortness of breath, diaphoresis, or edema. She has lost weight since starting ozempic. The N/T is much better.  Outpatient Medications Prior to Visit  Medication Sig Dispense Refill   carvedilol (COREG) 6.25 MG tablet TAKE ONE TABLET BY MOUTH TWICE A DAY WITH MEALS 180 tablet 0   Continuous Blood Gluc Receiver (DEXCOM G6 RECEIVER) DEVI 1 Act by Does not apply route daily. 1 each 5   Continuous Blood Gluc Sensor (DEXCOM G6 SENSOR) MISC 1 Act by Does not apply route daily. 1 each 5   Continuous Blood Gluc Transmit (DEXCOM G6 TRANSMITTER) MISC 1 Act by Does not apply route daily. 1 each 5   esomeprazole (NEXIUM) 40 MG capsule TAKE 1 TABLET BY MOUTH IN THE MORNING 30 capsule 5   Insulin Pen Needle (PEN NEEDLES) 32G X 6 MM MISC Inject 2 Act into the skin daily. Use to inject insulin daily. 100 each 1   irbesartan (AVAPRO) 300 MG tablet TAKE ONE TABLET BY MOUTH DAILY 90 tablet 0   metFORMIN (GLUCOPHAGE-XR) 750 MG 24 hr tablet TAKE TWO TABLETS BY MOUTH DAILY WITH BREAKFAST 180 tablet 0   ondansetron (ZOFRAN-ODT) 4 MG disintegrating tablet Take 1 tablet (4 mg total) by mouth every 8 (eight) hours as needed for nausea or vomiting. Please schedule an office visit for any further refills.Thank you 20 tablet 0   rosuvastatin (CRESTOR) 20 MG tablet Take 1 tablet (20 mg total) by mouth daily. 90 tablet 1   Glucagon (GVOKE HYPOPEN 2-PACK) 1 MG/0.2ML SOAJ Inject 1 Act into the skin daily as needed. 2 mL 5   glucose blood (COOL BLOOD GLUCOSE TEST STRIPS) test strip Use to check blood sugar twice daily. DX: E11.8 200 each 3   indapamide (LOZOL) 1.25 MG tablet TAKE ONE TABLET BY MOUTH DAILY 90 tablet 0   Semaglutide,0.25 or 0.'5MG'$ /DOS, (OZEMPIC, 0.25 OR  0.5 MG/DOSE,) 2 MG/1.5ML SOPN Inject 2 mg into the skin once a week. 21 mL 1   SEMGLEE, YFGN, 100 UNIT/ML Pen INJECT 30 UNITS INTO THE SKIN DAILY 15 mL 0   thiamine (VITAMIN B-1) 50 MG tablet Take 1 tablet (50 mg total) by mouth daily. 90 tablet 1   traMADol (ULTRAM) 50 MG tablet TAKE ONE TABLET BY MOUTH EVERY 6 HOURS AS NEEDED FOR SEVERE PAIN 75 tablet 0   No facility-administered medications prior to visit.    ROS Review of Systems  Constitutional:  Positive for fatigue and unexpected weight change (wt loss).  HENT: Negative.    Eyes: Negative.   Respiratory:  Negative for cough, chest tightness, shortness of breath and wheezing.   Cardiovascular:  Negative for chest pain, palpitations and leg swelling.  Gastrointestinal:  Negative for abdominal pain, constipation, diarrhea, nausea and vomiting.  Endocrine: Negative.   Genitourinary: Negative.  Negative for difficulty urinating.  Musculoskeletal:  Positive for arthralgias. Negative for back pain and myalgias.  Skin: Negative.   Neurological:  Negative for dizziness, weakness and headaches.  Hematological:  Negative for adenopathy. Does not bruise/bleed easily.  Psychiatric/Behavioral:  Negative for decreased concentration, dysphoric mood and sleep disturbance. The patient is not nervous/anxious.     Objective:  BP 124/68 (BP Location: Left Arm, Patient Position: Sitting, Cuff Size: Large)  Pulse 80   Temp 98.1 F (36.7 C) (Oral)   Ht '5\' 5"'$  (1.651 m)   Wt 193 lb (87.5 kg)   LMP 06/01/2012   SpO2 98%   BMI 32.12 kg/m   BP Readings from Last 3 Encounters:  04/05/22 124/68  01/23/22 124/82  12/04/21 130/82    Wt Readings from Last 3 Encounters:  04/05/22 193 lb (87.5 kg)  01/23/22 210 lb 12.8 oz (95.6 kg)  12/04/21 217 lb (98.4 kg)    Physical Exam Vitals reviewed.  HENT:     Nose: Nose normal.     Mouth/Throat:     Mouth: Mucous membranes are moist.  Eyes:     General: No scleral icterus.     Conjunctiva/sclera: Conjunctivae normal.  Cardiovascular:     Rate and Rhythm: Normal rate and regular rhythm.     Heart sounds: No murmur heard. Pulmonary:     Effort: Pulmonary effort is normal.     Breath sounds: No stridor. No wheezing, rhonchi or rales.  Abdominal:     General: Abdomen is flat.     Palpations: There is no mass.     Tenderness: There is no abdominal tenderness. There is no guarding.     Hernia: No hernia is present.  Musculoskeletal:        General: No swelling.     Cervical back: Neck supple.     Right lower leg: No edema.     Left lower leg: No edema.  Lymphadenopathy:     Cervical: No cervical adenopathy.  Skin:    General: Skin is warm and dry.  Neurological:     General: No focal deficit present.     Mental Status: She is alert. Mental status is at baseline.  Psychiatric:        Mood and Affect: Mood normal.        Behavior: Behavior normal.     Lab Results  Component Value Date   WBC 3.4 (L) 04/05/2022   HGB 10.6 (L) 04/05/2022   HCT 31.7 (L) 04/05/2022   PLT 171.0 04/05/2022   GLUCOSE 98 04/05/2022   CHOL 102 04/05/2022   TRIG 56.0 04/05/2022   HDL 46.10 04/05/2022   LDLDIRECT 138.6 04/15/2013   LDLCALC 45 04/05/2022   ALT 21 04/05/2022   AST 19 04/05/2022   NA 139 04/05/2022   K 3.4 (L) 04/05/2022   CL 100 04/05/2022   CREATININE 1.23 (H) 04/05/2022   BUN 37 (H) 04/05/2022   CO2 30 04/05/2022   TSH 0.55 10/12/2020   HGBA1C 6.6 (H) 04/05/2022   MICROALBUR 1.5 04/05/2022    MM DIAG BREAST TOMO BILATERAL  Result Date: 08/04/2021 CLINICAL DATA:  61 year old female for delayed 1 year follow-up of LEFT breast calcifications (now 2 years) and for annual bilateral mammogram. EXAM: DIGITAL DIAGNOSTIC BILATERAL MAMMOGRAM WITH CAD AND TOMO COMPARISON:  Previous exam(s). ACR Breast Density Category b: There are scattered areas of fibroglandular density. FINDINGS: Full field views of both breast and magnification views of the LEFT breast are  performed. The 2 groups of UPPER-OUTER LEFT breast calcifications are again noted with decreasing number of calcifications in each group. No new or suspicious findings are noted within either breast. Mammographic images were processed with CAD. IMPRESSION: 1. Decreased number of UPPER-OUTER LEFT breast calcifications without new or suspicious abnormality, considered benign given follow-up for 2 years. 2. No evidence of breast malignancy within either breast. RECOMMENDATION: Bilateral screening mammogram in 1 year. I have discussed the  findings and recommendations with the patient. If applicable, a reminder letter will be sent to the patient regarding the next appointment. BI-RADS CATEGORY  2: Benign. Electronically Signed   By: Margarette Canada M.D.   On: 08/04/2021 09:01   Assessment & Plan:   Virginia Jimenez was seen today for anemia, hypertension, osteoarthritis, hyperlipidemia and diabetes.  Diagnoses and all orders for this visit:  Screening for cervical cancer -     Ambulatory referral to Gynecology  Essential hypertension, benign-her blood pressure is somewhat overcontrolled and she has had a decline in her renal function.  We will discontinue indapamide. -     Basic metabolic panel; Future -     CBC with Differential/Platelet; Future -     Urinalysis, Routine w reflex microscopic; Future -     Urinalysis, Routine w reflex microscopic -     CBC with Differential/Platelet -     Basic metabolic panel  Insulin-requiring or dependent type II diabetes mellitus (HCC)-her A1c is down to 6.6%.  We will discontinue metformin and the basal insulin. -     Basic metabolic panel; Future -     Microalbumin / creatinine urine ratio; Future -     Hemoglobin A1c; Future -     Hemoglobin A1c -     Microalbumin / creatinine urine ratio -     Basic metabolic panel -     Semaglutide, 2 MG/DOSE, (OZEMPIC, 2 MG/DOSE,) 8 MG/3ML SOPN; Inject 2 mg into the skin once a week. -     Glucagon (GVOKE HYPOPEN 2-PACK) 1  MG/0.2ML SOAJ; Inject 1 Act into the skin daily as needed.  LVH (left ventricular hypertrophy) due to hypertensive disease, without heart failure-she has normal fluid status. -     Basic metabolic panel; Future -     CBC with Differential/Platelet; Future -     CBC with Differential/Platelet -     Basic metabolic panel  Hyperlipidemia with target LDL less than 100- LDL goal achieved. Doing well on the statin  -     Hepatic function panel; Future -     Lipid panel; Future -     Lipid panel -     Hepatic function panel  Thiamine deficiency neuropathy -     CBC with Differential/Platelet; Future -     thiamine (VITAMIN B-1) 50 MG tablet; Take 1 tablet (50 mg total) by mouth daily. -     CBC with Differential/Platelet  Primary osteoarthritis of both knees -     traMADol (ULTRAM) 50 MG tablet; Take 1 tablet (50 mg total) by mouth every 6 (six) hours as needed.  Need for prophylactic vaccination with combined diphtheria-tetanus-pertussis (DTP) vaccine -     Tdap (BOOSTRIX) 5-2.5-18.5 LF-MCG/0.5 injection; Inject 0.5 mLs into the muscle once for 1 dose.  Stage 3a chronic kidney disease (HCC)-we will discontinue indapamide.  Other orders -     Varicella-zoster vaccine IM (Shingrix)   I have discontinued Cleon Gustin. Virginia Jimenez Cool Blood Glucose Test Strips, Ozempic (0.25 or 0.5 MG/DOSE), indapamide, and Semglee (yfgn). I have also changed her traMADol. Additionally, I am having her start on Boostrix and Ozempic (2 MG/DOSE). Lastly, I am having her maintain her rosuvastatin, esomeprazole, irbesartan, metFORMIN, Pen Needles, Dexcom G6 Receiver, Dexcom G6 Transmitter, Dexcom G6 Sensor, carvedilol, ondansetron, thiamine, and Gvoke HypoPen 2-Pack.  Meds ordered this encounter  Medications   thiamine (VITAMIN B-1) 50 MG tablet    Sig: Take 1 tablet (50 mg total) by mouth daily.  Dispense:  90 tablet    Refill:  1   traMADol (ULTRAM) 50 MG tablet    Sig: Take 1 tablet (50 mg total) by mouth  every 6 (six) hours as needed.    Dispense:  75 tablet    Refill:  3   Tdap (BOOSTRIX) 5-2.5-18.5 LF-MCG/0.5 injection    Sig: Inject 0.5 mLs into the muscle once for 1 dose.    Dispense:  0.5 mL    Refill:  0   Semaglutide, 2 MG/DOSE, (OZEMPIC, 2 MG/DOSE,) 8 MG/3ML SOPN    Sig: Inject 2 mg into the skin once a week.    Dispense:  9 mL    Refill:  0   Glucagon (GVOKE HYPOPEN 2-PACK) 1 MG/0.2ML SOAJ    Sig: Inject 1 Act into the skin daily as needed.    Dispense:  2 mL    Refill:  5     Follow-up: Return in about 3 months (around 07/06/2022).  Scarlette Calico, MD

## 2022-04-05 NOTE — Patient Instructions (Signed)
Hypertension, Adult High blood pressure (hypertension) is when the force of blood pumping through the arteries is too strong. The arteries are the blood vessels that carry blood from the heart throughout the body. Hypertension forces the heart to work harder to pump blood and may cause arteries to become narrow or stiff. Untreated or uncontrolled hypertension can lead to a heart attack, heart failure, a stroke, kidney disease, and other problems. A blood pressure reading consists of a higher number over a lower number. Ideally, your blood pressure should be below 120/80. The first ("top") number is called the systolic pressure. It is a measure of the pressure in your arteries as your heart beats. The second ("bottom") number is called the diastolic pressure. It is a measure of the pressure in your arteries as the heart relaxes. What are the causes? The exact cause of this condition is not known. There are some conditions that result in high blood pressure. What increases the risk? Certain factors may make you more likely to develop high blood pressure. Some of these risk factors are under your control, including: Smoking. Not getting enough exercise or physical activity. Being overweight. Having too much fat, sugar, calories, or salt (sodium) in your diet. Drinking too much alcohol. Other risk factors include: Having a personal history of heart disease, diabetes, high cholesterol, or kidney disease. Stress. Having a family history of high blood pressure and high cholesterol. Having obstructive sleep apnea. Age. The risk increases with age. What are the signs or symptoms? High blood pressure may not cause symptoms. Very high blood pressure (hypertensive crisis) may cause: Headache. Fast or irregular heartbeats (palpitations). Shortness of breath. Nosebleed. Nausea and vomiting. Vision changes. Severe chest pain, dizziness, and seizures. How is this diagnosed? This condition is diagnosed by  measuring your blood pressure while you are seated, with your arm resting on a flat surface, your legs uncrossed, and your feet flat on the floor. The cuff of the blood pressure monitor will be placed directly against the skin of your upper arm at the level of your heart. Blood pressure should be measured at least twice using the same arm. Certain conditions can cause a difference in blood pressure between your right and left arms. If you have a high blood pressure reading during one visit or you have normal blood pressure with other risk factors, you may be asked to: Return on a different day to have your blood pressure checked again. Monitor your blood pressure at home for 1 week or longer. If you are diagnosed with hypertension, you may have other blood or imaging tests to help your health care provider understand your overall risk for other conditions. How is this treated? This condition is treated by making healthy lifestyle changes, such as eating healthy foods, exercising more, and reducing your alcohol intake. You may be referred for counseling on a healthy diet and physical activity. Your health care provider may prescribe medicine if lifestyle changes are not enough to get your blood pressure under control and if: Your systolic blood pressure is above 130. Your diastolic blood pressure is above 80. Your personal target blood pressure may vary depending on your medical conditions, your age, and other factors. Follow these instructions at home: Eating and drinking  Eat a diet that is high in fiber and potassium, and low in sodium, added sugar, and fat. An example of this eating plan is called the DASH diet. DASH stands for Dietary Approaches to Stop Hypertension. To eat this way: Eat   plenty of fresh fruits and vegetables. Try to fill one half of your plate at each meal with fruits and vegetables. Eat whole grains, such as whole-wheat pasta, brown rice, or whole-grain bread. Fill about one  fourth of your plate with whole grains. Eat or drink low-fat dairy products, such as skim milk or low-fat yogurt. Avoid fatty cuts of meat, processed or cured meats, and poultry with skin. Fill about one fourth of your plate with lean proteins, such as fish, chicken without skin, beans, eggs, or tofu. Avoid pre-made and processed foods. These tend to be higher in sodium, added sugar, and fat. Reduce your daily sodium intake. Many people with hypertension should eat less than 1,500 mg of sodium a day. Do not drink alcohol if: Your health care provider tells you not to drink. You are pregnant, may be pregnant, or are planning to become pregnant. If you drink alcohol: Limit how much you have to: 0-1 drink a day for women. 0-2 drinks a day for men. Know how much alcohol is in your drink. In the [place] one drink equals one 12 oz bottle of beer (355 mL), one 5 oz glass of wine (148 mL), or one 1 oz glass of hard liquor (44 mL). Lifestyle  Work with your health care provider to maintain a healthy body weight or to lose weight. Ask what an ideal weight is for you. Get at least 30 minutes of exercise that causes your heart to beat faster (aerobic exercise) most days of the week. Activities may include walking, swimming, or biking. Include exercise to strengthen your muscles (resistance exercise), such as Pilates or lifting weights, as part of your weekly exercise routine. Try to do these types of exercises for 30 minutes at least 3 days a week. Do not use any products that contain nicotine or tobacco. These products include cigarettes, chewing tobacco, and vaping devices, such as e-cigarettes. If you need help quitting, ask your health care provider. Monitor your blood pressure at home as told by your health care provider. Keep all follow-up visits. This is important. Medicines Take over-the-counter and prescription medicines only as told by your health care provider. Follow directions carefully. Blood  pressure medicines must be taken as prescribed. Do not skip doses of blood pressure medicine. Doing this puts you at risk for problems and can make the medicine less effective. Ask your health care provider about side effects or reactions to medicines that you should watch for. Contact a health care provider if you: Think you are having a reaction to a medicine you are taking. Have headaches that keep coming back (recurring). Feel dizzy. Have swelling in your ankles. Have trouble with your vision. Get help right away if you: Develop a severe headache or confusion. Have unusual weakness or numbness. Feel faint. Have severe pain in your chest or abdomen. Vomit repeatedly. Have trouble breathing. These symptoms may be an emergency. Get help right away. Call 911. Do not wait to see if the symptoms will go away. Do not drive yourself to the hospital. Summary Hypertension is when the force of blood pumping through your arteries is too strong. If this condition is not controlled, it may put you at risk for serious complications. Your personal target blood pressure may vary depending on your medical conditions, your age, and other factors. For most people, a normal blood pressure is less than 120/80. Hypertension is treated with lifestyle changes, medicines, or a combination of both. Lifestyle changes include losing weight, eating a healthy,   low-sodium diet, exercising more, and limiting alcohol. This information is not intended to replace advice given to you by your health care provider. Make sure you discuss any questions you have with your health care provider. Document Revised: 07/11/2021 Document Reviewed: 07/11/2021 Elsevier Patient Education  2023 [employer]  

## 2022-04-16 ENCOUNTER — Other Ambulatory Visit: Payer: Self-pay | Admitting: Internal Medicine

## 2022-04-16 DIAGNOSIS — E785 Hyperlipidemia, unspecified: Secondary | ICD-10-CM

## 2022-04-21 ENCOUNTER — Other Ambulatory Visit: Payer: Self-pay | Admitting: Internal Medicine

## 2022-04-21 DIAGNOSIS — I10 Essential (primary) hypertension: Secondary | ICD-10-CM

## 2022-04-21 DIAGNOSIS — I119 Hypertensive heart disease without heart failure: Secondary | ICD-10-CM

## 2022-05-09 ENCOUNTER — Telehealth: Payer: Self-pay

## 2022-05-09 DIAGNOSIS — E118 Type 2 diabetes mellitus with unspecified complications: Secondary | ICD-10-CM

## 2022-05-09 MED ORDER — METFORMIN HCL ER 750 MG PO TB24: ORAL_TABLET | ORAL | 0 refills | Status: DC

## 2022-05-09 NOTE — Telephone Encounter (Signed)
Pt is requesting a refill on: metFORMIN (GLUCOPHAGE-XR) 750 MG 24 hr tablet  Pharmacy: [facility] [id number] [place], [place] 04/05/22 ROV 07/17/22

## 2022-05-23 ENCOUNTER — Other Ambulatory Visit: Payer: Self-pay | Admitting: Internal Medicine

## 2022-05-23 DIAGNOSIS — E118 Type 2 diabetes mellitus with unspecified complications: Secondary | ICD-10-CM

## 2022-05-23 DIAGNOSIS — I10 Essential (primary) hypertension: Secondary | ICD-10-CM

## 2022-07-17 ENCOUNTER — Encounter: Payer: Self-pay | Admitting: Internal Medicine

## 2022-07-17 ENCOUNTER — Ambulatory Visit: Payer: BC Managed Care – PPO | Admitting: Internal Medicine

## 2022-07-17 VITALS — BP 144/78 | HR 91 | Temp 98.5°F | Resp 16 | Ht 65.0 in | Wt 178.0 lb

## 2022-07-17 DIAGNOSIS — E119 Type 2 diabetes mellitus without complications: Secondary | ICD-10-CM | POA: Diagnosis not present

## 2022-07-17 DIAGNOSIS — Z0000 Encounter for general adult medical examination without abnormal findings: Secondary | ICD-10-CM

## 2022-07-17 DIAGNOSIS — E5111 Dry beriberi: Secondary | ICD-10-CM

## 2022-07-17 DIAGNOSIS — Z0001 Encounter for general adult medical examination with abnormal findings: Secondary | ICD-10-CM | POA: Insufficient documentation

## 2022-07-17 DIAGNOSIS — N1831 Chronic kidney disease, stage 3a: Secondary | ICD-10-CM | POA: Diagnosis not present

## 2022-07-17 DIAGNOSIS — E118 Type 2 diabetes mellitus with unspecified complications: Secondary | ICD-10-CM

## 2022-07-17 DIAGNOSIS — Z23 Encounter for immunization: Secondary | ICD-10-CM | POA: Diagnosis not present

## 2022-07-17 DIAGNOSIS — Z794 Long term (current) use of insulin: Secondary | ICD-10-CM | POA: Diagnosis not present

## 2022-07-17 DIAGNOSIS — I10 Essential (primary) hypertension: Secondary | ICD-10-CM | POA: Diagnosis not present

## 2022-07-17 LAB — BASIC METABOLIC PANEL
BUN: 16 mg/dL (ref 6–23)
CO2: 28 mEq/L (ref 19–32)
Calcium: 9.8 mg/dL (ref 8.4–10.5)
Chloride: 102 mEq/L (ref 96–112)
Creatinine, Ser: 1.02 mg/dL (ref 0.40–1.20)
GFR: 59.67 mL/min — ABNORMAL LOW (ref >60.00)
Glucose, Bld: 120 mg/dL — ABNORMAL HIGH (ref 70–99)
Potassium: 4.1 mEq/L (ref 3.5–5.1)
Sodium: 138 mEq/L (ref 135–145)

## 2022-07-17 LAB — CBC WITH DIFFERENTIAL/PLATELET
Basophils Absolute: 0 10*3/uL (ref 0.0–0.1)
Basophils Relative: 0.5 % (ref 0.0–3.0)
Eosinophils Absolute: 0.1 10*3/uL (ref 0.0–0.7)
Eosinophils Relative: 1.2 % (ref 0.0–5.0)
HCT: 34.1 % — ABNORMAL LOW (ref 36.0–46.0)
Hemoglobin: 11.3 g/dL — ABNORMAL LOW (ref 12.0–15.0)
Lymphocytes Relative: 20.7 % (ref 12.0–46.0)
Lymphs Abs: 1.1 10*3/uL (ref 0.7–4.0)
MCHC: 33.3 g/dL (ref 30.0–36.0)
MCV: 92.9 fl (ref 78.0–100.0)
Monocytes Absolute: 0.5 10*3/uL (ref 0.1–1.0)
Monocytes Relative: 9.2 % (ref 3.0–12.0)
Neutro Abs: 3.6 10*3/uL (ref 1.4–7.7)
Neutrophils Relative %: 68.4 % (ref 43.0–77.0)
Platelets: 181 10*3/uL (ref 150.0–400.0)
RBC: 3.67 Mil/uL — ABNORMAL LOW (ref 3.87–5.11)
RDW: 13.1 % (ref 11.5–15.5)
WBC: 5.2 10*3/uL (ref 4.0–10.5)

## 2022-07-17 LAB — HEMOGLOBIN A1C: Hgb A1c MFr Bld: 6.5 % (ref 4.6–6.5)

## 2022-07-17 MED ORDER — DEXCOM G6 SENSOR MISC: 1.0000 | Freq: Every day | 5 refills | Status: DC

## 2022-07-17 MED ORDER — SEMAGLUTIDE (1 MG/DOSE) 4 MG/3ML ~~LOC~~ SOPN: 1.0000 mg | PEN_INJECTOR | SUBCUTANEOUS | 1 refills | Status: DC

## 2022-07-17 MED ORDER — DEXCOM G6 TRANSMITTER MISC: 1.0000 | Freq: Every day | 5 refills | Status: DC

## 2022-07-17 MED ORDER — DEXCOM G6 RECEIVER DEVI: 1.0000 | Freq: Every day | 5 refills | Status: DC

## 2022-07-17 NOTE — Progress Notes (Signed)
Subjective:  Patient ID: Fraser Din, female    DOB: June 16, 1961  Age: 61 y.o. MRN: [id number]  CC: Annual Exam, Hypertension, and Diabetes   HPI Seraphim Affinito presents for a CPX and f/up -   She is active and denies chest pain, shortness of breath, diaphoresis, or edema.   Outpatient Medications Prior to Visit  Medication Sig Dispense Refill   carvedilol (COREG) 6.25 MG tablet TAKE 1 TABLET BY MOUTH TWICE A DAY WITH MEALS 180 tablet 1   esomeprazole (NEXIUM) 40 MG capsule TAKE 1 TABLET BY MOUTH IN THE MORNING 30 capsule 5   Glucagon (GVOKE HYPOPEN 2-PACK) 1 MG/0.2ML SOAJ Inject 1 Act into the skin daily as needed. 2 mL 5   indapamide (LOZOL) 1.25 MG tablet TAKE ONE TABLET BY MOUTH DAILY 90 tablet 1   Insulin Pen Needle (PEN NEEDLES) 32G X 6 MM MISC Inject 2 Act into the skin daily. Use to inject insulin daily. 100 each 1   irbesartan (AVAPRO) 300 MG tablet TAKE ONE TABLET BY MOUTH DAILY 90 tablet 0   metFORMIN (GLUCOPHAGE-XR) 750 MG 24 hr tablet TAKE TWO TABLETS BY MOUTH DAILY WITH BREAKFAST 180 tablet 0   rosuvastatin (CRESTOR) 20 MG tablet TAKE ONE TABLET BY MOUTH DAILY 90 tablet 1   thiamine (VITAMIN B-1) 50 MG tablet Take 1 tablet (50 mg total) by mouth daily. 90 tablet 1   traMADol (ULTRAM) 50 MG tablet Take 1 tablet (50 mg total) by mouth every 6 (six) hours as needed. 75 tablet 3   Continuous Blood Gluc Receiver (DEXCOM G6 RECEIVER) DEVI 1 Act by Does not apply route daily. 1 each 5   Continuous Blood Gluc Sensor (DEXCOM G6 SENSOR) MISC 1 Act by Does not apply route daily. 1 each 5   Continuous Blood Gluc Transmit (DEXCOM G6 TRANSMITTER) MISC 1 Act by Does not apply route daily. 1 each 5   ondansetron (ZOFRAN-ODT) 4 MG disintegrating tablet Take 1 tablet (4 mg total) by mouth every 8 (eight) hours as needed for nausea or vomiting. Please schedule an office visit for any further refills.Thank you 20 tablet 0   Semaglutide, 2 MG/DOSE, (OZEMPIC, 2 MG/DOSE,) 8 MG/3ML SOPN  Inject 2 mg into the skin once a week. 9 mL 0   No facility-administered medications prior to visit.    ROS Review of Systems  Constitutional: Negative.  Negative for diaphoresis and fatigue.  HENT: Negative.    Eyes: Negative.   Respiratory:  Negative for cough, chest tightness, shortness of breath and wheezing.   Cardiovascular:  Negative for chest pain, palpitations and leg swelling.  Gastrointestinal:  Negative for abdominal pain, blood in stool, constipation, diarrhea and vomiting.  Endocrine: Negative.   Genitourinary: Negative.  Negative for difficulty urinating.  Musculoskeletal: Negative.  Negative for arthralgias, back pain, myalgias and neck pain.  Skin: Negative.   Neurological: Negative.  Negative for dizziness, weakness and light-headedness.  Hematological:  Negative for adenopathy. Does not bruise/bleed easily.  Psychiatric/Behavioral: Negative.      Objective:  BP (!) 144/78 (BP Location: Left Arm, Patient Position: Sitting, Cuff Size: Large)   Pulse 91   Temp 98.5 F (36.9 C) (Oral)   Resp 16   Ht '5\' 5"'$  (1.651 m)   Wt 178 lb (80.7 kg)   LMP 06/01/2012   SpO2 96%   BMI 29.62 kg/m   BP Readings from Last 3 Encounters:  07/17/22 (!) 144/78  04/05/22 124/68  01/23/22 124/82    Wt  Readings from Last 3 Encounters:  07/17/22 178 lb (80.7 kg)  04/05/22 193 lb (87.5 kg)  01/23/22 210 lb 12.8 oz (95.6 kg)    Physical Exam Vitals reviewed.  HENT:     Nose: Nose normal.     Mouth/Throat:     Mouth: Mucous membranes are moist.  Eyes:     General: No scleral icterus.    Conjunctiva/sclera: Conjunctivae normal.  Cardiovascular:     Rate and Rhythm: Normal rate and regular rhythm.     Heart sounds: Normal heart sounds and S1 normal. No murmur heard.    No friction rub. No gallop.     Comments: EKG- NSR, 79 bpm No LVH or Q waves Pulmonary:     Effort: Pulmonary effort is normal.     Breath sounds: No stridor. No wheezing, rhonchi or rales.  Abdominal:      General: Abdomen is flat.     Palpations: There is no mass.     Tenderness: There is no abdominal tenderness. There is no guarding or rebound.     Hernia: No hernia is present.  Musculoskeletal:        General: No swelling. Normal range of motion.     Cervical back: Neck supple.     Right lower leg: No edema.     Left lower leg: No edema.  Skin:    General: Skin is warm and dry.     Coloration: Skin is not pale.  Neurological:     General: No focal deficit present.     Mental Status: She is alert.  Psychiatric:        Mood and Affect: Mood normal.        Behavior: Behavior normal.     Lab Results  Component Value Date   WBC 5.2 07/17/2022   HGB 11.3 (L) 07/17/2022   HCT 34.1 (L) 07/17/2022   PLT 181.0 07/17/2022   GLUCOSE 120 (H) 07/17/2022   CHOL 102 04/05/2022   TRIG 56.0 04/05/2022   HDL 46.10 04/05/2022   LDLDIRECT 138.6 04/15/2013   LDLCALC 45 04/05/2022   ALT 21 04/05/2022   AST 19 04/05/2022   NA 138 07/17/2022   K 4.1 07/17/2022   CL 102 07/17/2022   CREATININE 1.02 07/17/2022   BUN 16 07/17/2022   CO2 28 07/17/2022   TSH 0.55 10/12/2020   HGBA1C 6.5 07/17/2022   MICROALBUR 1.5 04/05/2022    MM DIAG BREAST TOMO BILATERAL  Result Date: 08/04/2021 CLINICAL DATA:  61 year old female for delayed 1 year follow-up of LEFT breast calcifications (now 2 years) and for annual bilateral mammogram. EXAM: DIGITAL DIAGNOSTIC BILATERAL MAMMOGRAM WITH CAD AND TOMO COMPARISON:  Previous exam(s). ACR Breast Density Category b: There are scattered areas of fibroglandular density. FINDINGS: Full field views of both breast and magnification views of the LEFT breast are performed. The 2 groups of UPPER-OUTER LEFT breast calcifications are again noted with decreasing number of calcifications in each group. No new or suspicious findings are noted within either breast. Mammographic images were processed with CAD. IMPRESSION: 1. Decreased number of UPPER-OUTER LEFT breast  calcifications without new or suspicious abnormality, considered benign given follow-up for 2 years. 2. No evidence of breast malignancy within either breast. RECOMMENDATION: Bilateral screening mammogram in 1 year. I have discussed the findings and recommendations with the patient. If applicable, a reminder letter will be sent to the patient regarding the next appointment. BI-RADS CATEGORY  2: Benign. Electronically Signed   By: Margarette Canada  M.D.   On: 08/04/2021 09:01   Assessment & Plan:   Marwa was seen today for annual exam, hypertension and diabetes.  Diagnoses and all orders for this visit:  Essential hypertension, benign- EKG is negative for LVH. She has not achieved her blood pressure goal of 130/80.  I recommended she improve her lifestyle modifications. -     Basic metabolic panel; Future -     EKG 12-Lead -     Basic metabolic panel  Stage 3a chronic kidney disease ([place])- Her renal function has improved. -     Basic metabolic panel; Future -     Basic metabolic panel  Thiamine deficiency neuropathy- Her anemia has worsened.  I have asked her to return to be screened for other vitamin deficiencies. -     CBC with Differential/Platelet; Future -     CBC with Differential/Platelet  Insulin-requiring or dependent type II diabetes mellitus ([place]) -     Hemoglobin A1c; Future -     Hemoglobin A1c -     Continuous Blood Gluc Transmit (DEXCOM G6 TRANSMITTER) MISC; 1 Act by Does not apply route daily. -     Continuous Blood Gluc Sensor (DEXCOM G6 SENSOR) MISC; 1 Act by Does not apply route daily. -     Continuous Blood Gluc Receiver ([place]) DEVI; 1 Act by Does not apply route daily.  Type II diabetes mellitus with manifestations ([place])- Her blood sugar is adequately well controlled. -     Semaglutide, 1 MG/DOSE, 4 MG/3ML SOPN; Inject 1 mg as directed once a week.  Encounter for general adult medical examination with abnormal findings- Exam completed, labs reviewed,  vaccines reviewed and updated, cancer screenings are up-to-date, patient education was given.  Other orders -     Flu Vaccine QUAD 6+ mos PF IM (Fluarix Quad PF) -     Tdap vaccine greater than or equal to 7yo IM   I have discontinued Cleon Gustin. Criscuolo's ondansetron and Ozempic (2 MG/DOSE). I am also having her start on Semaglutide (1 MG/DOSE). Additionally, I am having her maintain her esomeprazole, irbesartan, Pen Needles, thiamine, traMADol, Gvoke HypoPen 2-Pack, rosuvastatin, indapamide, carvedilol, metFORMIN, Dexcom G6 Transmitter, Dexcom G6 Sensor, and Dexcom G6 Receiver.  Meds ordered this encounter  Medications   Semaglutide, 1 MG/DOSE, 4 MG/3ML SOPN    Sig: Inject 1 mg as directed once a week.    Dispense:  9 mL    Refill:  1   Continuous Blood Gluc Transmit (DEXCOM G6 TRANSMITTER) MISC    Sig: 1 Act by Does not apply route daily.    Dispense:  1 each    Refill:  5   Continuous Blood Gluc Sensor (DEXCOM G6 SENSOR) MISC    Sig: 1 Act by Does not apply route daily.    Dispense:  1 each    Refill:  5   Continuous Blood Gluc Receiver (DEXCOM G6 RECEIVER) DEVI    Sig: 1 Act by Does not apply route daily.    Dispense:  1 each    Refill:  5     Follow-up: Return in about 6 months (around 01/15/2023).  Scarlette Calico, MD

## 2022-07-17 NOTE — Patient Instructions (Signed)

## 2022-07-19 ENCOUNTER — Encounter: Payer: Self-pay | Admitting: Internal Medicine

## 2022-08-07 ENCOUNTER — Other Ambulatory Visit: Payer: Self-pay | Admitting: Internal Medicine

## 2022-08-07 DIAGNOSIS — E118 Type 2 diabetes mellitus with unspecified complications: Secondary | ICD-10-CM

## 2022-10-15 ENCOUNTER — Telehealth: Payer: Self-pay

## 2022-10-15 NOTE — Telephone Encounter (Signed)
Key: [id number]

## 2022-10-15 NOTE — Telephone Encounter (Signed)
Approved Effective from 10/15/2022 through 10/14/2023.

## 2022-10-22 ENCOUNTER — Other Ambulatory Visit: Payer: Self-pay | Admitting: Internal Medicine

## 2022-10-22 DIAGNOSIS — Z1231 Encounter for screening mammogram for malignant neoplasm of breast: Secondary | ICD-10-CM

## 2022-11-10 ENCOUNTER — Other Ambulatory Visit: Payer: Self-pay | Admitting: Internal Medicine

## 2022-11-10 DIAGNOSIS — E118 Type 2 diabetes mellitus with unspecified complications: Secondary | ICD-10-CM

## 2022-11-23 ENCOUNTER — Other Ambulatory Visit: Payer: Self-pay | Admitting: Internal Medicine

## 2022-11-23 ENCOUNTER — Telehealth: Payer: Self-pay

## 2022-11-23 DIAGNOSIS — E785 Hyperlipidemia, unspecified: Secondary | ICD-10-CM

## 2022-11-23 NOTE — Progress Notes (Signed)
Patient appearing on report for [employer] [facility] - Hypertension Control report due to last documented ambulatory blood pressure of 144/78 on 07/17/22. Next appointment with PCP is 11/29/22   Outreached patient to discuss hypertension control and medication management. Left voicemail for patient to return my call at their convenience.   Darlina Guys, PharmD, DPLA

## 2022-11-28 ENCOUNTER — Telehealth: Payer: Self-pay

## 2022-11-28 NOTE — Progress Notes (Signed)
   11/28/2022  Patient ID: Virginia Jimenez, female   DOB: 01-Mar-1961, 62 y.o.   MRN: [id number]  Patient appearing on report for [employer] [facility] - Hypertension Control report due to last documented ambulatory blood pressure of 144/78 on 07/17/22. Next appointment with PCP is 11/29/22   Patient returned my call in response to voicemail left to discuss medications prior to upcoming appointment, but she was at worked and seemed rushed and unable to answer all questions.  HTN Current antihypertensives: carvedilol 6.25mg  BID -Patient states she has not been taking irbesartan 300mg  daily or indapamide 1.25mg  daily, but these are listed as active and noted as medications to continue in Dr. Adah Salvage last visit note from 10/31 -Patient does not check blood pressure at home  DM -A1c 6.5 and BG 120 on 07/17/22 -Current medications:  Metformin XR 1500mg  daily and Ozempic 1mg  weekly -Patient endorses recent weight loss off approximately 40lbs -Not using [employer] CGM supplies due to cost, but does endorse checking BG at home with testing supplies -Unable to provide home values at this time due to being at work -Needs refills on strips and lancets but cannot remember which meter she has at home  HLD -Lipds WNL 04/05/22 -Current medications:  rosuvastatin 20mg  daily  Assessment/Plan: HTN - Currently uncontrolled - If BP remains above goal of 130/80 at upcoming appointment, consider having patient re-start indapamide 1.25mg  and/or irbesartan.  Irbesartan can be restarted at 300mg  dose not necessary.  DM -Patient is to bring name of test strips and lancets that she needs refills on to upcoming appointment -Not interested in CGM at this time; states she is fine poking her finger to check home BG  HLD -Well controlled- continue current regimen   Darlina Guys, PharmD, DPLA

## 2022-11-29 ENCOUNTER — Encounter: Payer: Self-pay | Admitting: Internal Medicine

## 2022-11-29 ENCOUNTER — Ambulatory Visit: Payer: BC Managed Care – PPO | Admitting: Internal Medicine

## 2022-11-29 VITALS — BP 144/78 | HR 92 | Temp 98.1°F | Resp 16 | Ht 65.0 in | Wt 171.0 lb

## 2022-11-29 DIAGNOSIS — E118 Type 2 diabetes mellitus with unspecified complications: Secondary | ICD-10-CM | POA: Diagnosis not present

## 2022-11-29 DIAGNOSIS — I10 Essential (primary) hypertension: Secondary | ICD-10-CM

## 2022-11-29 DIAGNOSIS — I152 Hypertension secondary to endocrine disorders: Secondary | ICD-10-CM

## 2022-11-29 DIAGNOSIS — E5111 Dry beriberi: Secondary | ICD-10-CM | POA: Diagnosis not present

## 2022-11-29 DIAGNOSIS — I119 Hypertensive heart disease without heart failure: Secondary | ICD-10-CM

## 2022-11-29 DIAGNOSIS — E1159 Type 2 diabetes mellitus with other circulatory complications: Secondary | ICD-10-CM | POA: Diagnosis not present

## 2022-11-29 DIAGNOSIS — N1831 Chronic kidney disease, stage 3a: Secondary | ICD-10-CM | POA: Diagnosis not present

## 2022-11-29 DIAGNOSIS — D539 Nutritional anemia, unspecified: Secondary | ICD-10-CM | POA: Insufficient documentation

## 2022-11-29 LAB — BASIC METABOLIC PANEL
BUN: 21 mg/dL (ref 6–23)
CO2: 29 mEq/L (ref 19–32)
Calcium: 9.9 mg/dL (ref 8.4–10.5)
Chloride: 103 mEq/L (ref 96–112)
Creatinine, Ser: 0.96 mg/dL (ref 0.40–1.20)
GFR: 64 mL/min (ref >60.00)
Glucose, Bld: 111 mg/dL — ABNORMAL HIGH (ref 70–99)
Potassium: 4.3 mEq/L (ref 3.5–5.1)
Sodium: 139 mEq/L (ref 135–145)

## 2022-11-29 LAB — CBC WITH DIFFERENTIAL/PLATELET
Basophils Absolute: 0 10*3/uL (ref 0.0–0.1)
Basophils Relative: 0.7 % (ref 0.0–3.0)
Eosinophils Absolute: 0.1 10*3/uL (ref 0.0–0.7)
Eosinophils Relative: 2 % (ref 0.0–5.0)
HCT: 34.9 % — ABNORMAL LOW (ref 36.0–46.0)
Hemoglobin: 11.7 g/dL — ABNORMAL LOW (ref 12.0–15.0)
Lymphocytes Relative: 29.4 % (ref 12.0–46.0)
Lymphs Abs: 1.1 10*3/uL (ref 0.7–4.0)
MCHC: 33.7 g/dL (ref 30.0–36.0)
MCV: 94.6 fl (ref 78.0–100.0)
Monocytes Absolute: 0.4 10*3/uL (ref 0.1–1.0)
Monocytes Relative: 10.1 % (ref 3.0–12.0)
Neutro Abs: 2.1 10*3/uL (ref 1.4–7.7)
Neutrophils Relative %: 57.8 % (ref 43.0–77.0)
Platelets: 184 10*3/uL (ref 150.0–400.0)
RBC: 3.69 Mil/uL — ABNORMAL LOW (ref 3.87–5.11)
RDW: 12.3 % (ref 11.5–15.5)
WBC: 3.6 10*3/uL — ABNORMAL LOW (ref 4.0–10.5)

## 2022-11-29 LAB — HEMOGLOBIN A1C: Hgb A1c MFr Bld: 6.4 % (ref 4.6–6.5)

## 2022-11-29 LAB — TSH: TSH: 0.94 u[IU]/mL (ref 0.35–5.50)

## 2022-11-29 MED ORDER — SEMAGLUTIDE (2 MG/DOSE) 8 MG/3ML ~~LOC~~ SOPN: 2.0000 mg | PEN_INJECTOR | SUBCUTANEOUS | 0 refills | Status: DC

## 2022-11-29 MED ORDER — VITAMIN B-1 50 MG PO TABS: 50.0000 mg | ORAL_TABLET | Freq: Every day | ORAL | 1 refills | Status: DC

## 2022-11-29 MED ORDER — OLMESARTAN MEDOXOMIL 20 MG PO TABS: 20.0000 mg | ORAL_TABLET | Freq: Every day | ORAL | 1 refills | Status: DC

## 2022-11-29 NOTE — Patient Instructions (Signed)
Hypertension, Adult High blood pressure (hypertension) is when the force of blood pumping through the arteries is too strong. The arteries are the blood vessels that carry blood from the heart throughout the body. Hypertension forces the heart to work harder to pump blood and may cause arteries to become narrow or stiff. Untreated or uncontrolled hypertension can lead to a heart attack, heart failure, a stroke, kidney disease, and other problems. A blood pressure reading consists of a higher number over a lower number. Ideally, your blood pressure should be below 120/80. The first ("top") number is called the systolic pressure. It is a measure of the pressure in your arteries as your heart beats. The second ("bottom") number is called the diastolic pressure. It is a measure of the pressure in your arteries as the heart relaxes. What are the causes? The exact cause of this condition is not known. There are some conditions that result in high blood pressure. What increases the risk? Certain factors may make you more likely to develop high blood pressure. Some of these risk factors are under your control, including: Smoking. Not getting enough exercise or physical activity. Being overweight. Having too much fat, sugar, calories, or salt (sodium) in your diet. Drinking too much alcohol. Other risk factors include: Having a personal history of heart disease, diabetes, high cholesterol, or kidney disease. Stress. Having a family history of high blood pressure and high cholesterol. Having obstructive sleep apnea. Age. The risk increases with age. What are the signs or symptoms? High blood pressure may not cause symptoms. Very high blood pressure (hypertensive crisis) may cause: Headache. Fast or irregular heartbeats (palpitations). Shortness of breath. Nosebleed. Nausea and vomiting. Vision changes. Severe chest pain, dizziness, and seizures. How is this diagnosed? This condition is diagnosed by  measuring your blood pressure while you are seated, with your arm resting on a flat surface, your legs uncrossed, and your feet flat on the floor. The cuff of the blood pressure monitor will be placed directly against the skin of your upper arm at the level of your heart. Blood pressure should be measured at least twice using the same arm. Certain conditions can cause a difference in blood pressure between your right and left arms. If you have a high blood pressure reading during one visit or you have normal blood pressure with other risk factors, you may be asked to: Return on a different day to have your blood pressure checked again. Monitor your blood pressure at home for 1 week or longer. If you are diagnosed with hypertension, you may have other blood or imaging tests to help your health care provider understand your overall risk for other conditions. How is this treated? This condition is treated by making healthy lifestyle changes, such as eating healthy foods, exercising more, and reducing your alcohol intake. You may be referred for counseling on a healthy diet and physical activity. Your health care provider may prescribe medicine if lifestyle changes are not enough to get your blood pressure under control and if: Your systolic blood pressure is above 130. Your diastolic blood pressure is above 80. Your personal target blood pressure may vary depending on your medical conditions, your age, and other factors. Follow these instructions at home: Eating and drinking  Eat a diet that is high in fiber and potassium, and low in sodium, added sugar, and fat. An example of this eating plan is called the DASH diet. DASH stands for Dietary Approaches to Stop Hypertension. To eat this way: Eat   plenty of fresh fruits and vegetables. Try to fill one half of your plate at each meal with fruits and vegetables. Eat whole grains, such as whole-wheat pasta, brown rice, or whole-grain bread. Fill about one  fourth of your plate with whole grains. Eat or drink low-fat dairy products, such as skim milk or low-fat yogurt. Avoid fatty cuts of meat, processed or cured meats, and poultry with skin. Fill about one fourth of your plate with lean proteins, such as fish, chicken without skin, beans, eggs, or tofu. Avoid pre-made and processed foods. These tend to be higher in sodium, added sugar, and fat. Reduce your daily sodium intake. Many people with hypertension should eat less than 1,500 mg of sodium a day. Do not drink alcohol if: Your health care provider tells you not to drink. You are pregnant, may be pregnant, or are planning to become pregnant. If you drink alcohol: Limit how much you have to: 0-1 drink a day for women. 0-2 drinks a day for men. Know how much alcohol is in your drink. In the [place] one drink equals one 12 oz bottle of beer (355 mL), one 5 oz glass of wine (148 mL), or one 1 oz glass of hard liquor (44 mL). Lifestyle  Work with your health care provider to maintain a healthy body weight or to lose weight. Ask what an ideal weight is for you. Get at least 30 minutes of exercise that causes your heart to beat faster (aerobic exercise) most days of the week. Activities may include walking, swimming, or biking. Include exercise to strengthen your muscles (resistance exercise), such as Pilates or lifting weights, as part of your weekly exercise routine. Try to do these types of exercises for 30 minutes at least 3 days a week. Do not use any products that contain nicotine or tobacco. These products include cigarettes, chewing tobacco, and vaping devices, such as e-cigarettes. If you need help quitting, ask your health care provider. Monitor your blood pressure at home as told by your health care provider. Keep all follow-up visits. This is important. Medicines Take over-the-counter and prescription medicines only as told by your health care provider. Follow directions carefully. Blood  pressure medicines must be taken as prescribed. Do not skip doses of blood pressure medicine. Doing this puts you at risk for problems and can make the medicine less effective. Ask your health care provider about side effects or reactions to medicines that you should watch for. Contact a health care provider if you: Think you are having a reaction to a medicine you are taking. Have headaches that keep coming back (recurring). Feel dizzy. Have swelling in your ankles. Have trouble with your vision. Get help right away if you: Develop a severe headache or confusion. Have unusual weakness or numbness. Feel faint. Have severe pain in your chest or abdomen. Vomit repeatedly. Have trouble breathing. These symptoms may be an emergency. Get help right away. Call 911. Do not wait to see if the symptoms will go away. Do not drive yourself to the hospital. Summary Hypertension is when the force of blood pumping through your arteries is too strong. If this condition is not controlled, it may put you at risk for serious complications. Your personal target blood pressure may vary depending on your medical conditions, your age, and other factors. For most people, a normal blood pressure is less than 120/80. Hypertension is treated with lifestyle changes, medicines, or a combination of both. Lifestyle changes include losing weight, eating a healthy,   low-sodium diet, exercising more, and limiting alcohol. This information is not intended to replace advice given to you by your health care provider. Make sure you discuss any questions you have with your health care provider. Document Revised: 07/11/2021 Document Reviewed: 07/11/2021 Elsevier Patient Education  2023 [employer]  

## 2022-11-29 NOTE — Progress Notes (Signed)
Subjective:  Patient ID: Virginia Jimenez, female    DOB: [birth year]  Age: 62 y.o. MRN: [id number]  CC: Hypertension, Diabetes, and Anemia   HPI Virginia Jimenez presents for f/up ---  She complains of chronic fatigue but she is active and has good endurance.  She denies chest pain, shortness of breath, diaphoresis, or edema.   Darlina Guys, RPH  Janith Lima, MD Dr. Ronnald Ramp,  Ms. Hallam called today, and I was able to do a medication review prior to her appointment with you tomorrow.  Of note:  she needs refills on diabetic testing supplies but could not remember which meter she has; so she will bring this information to her appointment so refills can be sent for correct products.  She also has not been taking indapamide or irbesartan but could not really give a reason as to why.  I am not able to see these were ever discontinued.  Please see my recommendations around BP if uncontrolled at visit.  I am also happy to follow up with her around home BP and BG control if needed- just let me know.  Thank you!  Darlina Guys, PharmD, DPLA Outpatient Medications Prior to Visit  Medication Sig Dispense Refill   carvedilol (COREG) 6.25 MG tablet TAKE 1 TABLET BY MOUTH TWICE A DAY WITH MEALS 180 tablet 1   Glucagon (GVOKE HYPOPEN 2-PACK) 1 MG/0.2ML SOAJ Inject 1 Act into the skin daily as needed. 2 mL 5   indapamide (LOZOL) 1.25 MG tablet TAKE ONE TABLET BY MOUTH DAILY 90 tablet 1   rosuvastatin (CRESTOR) 20 MG tablet TAKE 1 TABLET BY MOUTH DAILY 90 tablet 0   traMADol (ULTRAM) 50 MG tablet Take 1 tablet (50 mg total) by mouth every 6 (six) hours as needed. 75 tablet 3   irbesartan (AVAPRO) 300 MG tablet TAKE ONE TABLET BY MOUTH DAILY 90 tablet 0   metFORMIN (GLUCOPHAGE-XR) 750 MG 24 hr tablet TAKE 2 TABLETS BY MOUTH DAILY WITH BREAKFAST 180 tablet 0   Semaglutide, 1 MG/DOSE, 4 MG/3ML SOPN Inject 1 mg as directed once a week. 9 mL 1   No facility-administered medications prior to  visit.    ROS Review of Systems  Constitutional:  Positive for fatigue. Negative for activity change, appetite change, diaphoresis and unexpected weight change.  HENT: Negative.    Eyes: Negative.   Respiratory: Negative.  Negative for cough, shortness of breath and wheezing.   Cardiovascular:  Negative for chest pain, palpitations and leg swelling.  Gastrointestinal:  Negative for abdominal pain, blood in stool, constipation, diarrhea, nausea and vomiting.  Endocrine: Negative.   Genitourinary: Negative.  Negative for difficulty urinating.  Musculoskeletal: Negative.   Skin: Negative.   Neurological: Negative.  Negative for dizziness.  Hematological:  Negative for adenopathy. Does not bruise/bleed easily.    Objective:  BP (!) 144/78 (BP Location: Right Arm, Patient Position: Sitting, Cuff Size: Large)   Pulse 92   Temp 98.1 F (36.7 C) (Oral)   Resp 16   Ht 5\' 5"  (1.651 m)   Wt 171 lb (77.6 kg)   LMP 06/01/2012   SpO2 96%   BMI 28.46 kg/m   BP Readings from Last 3 Encounters:  11/29/22 (!) 144/78  07/17/22 (!) 144/78  04/05/22 124/68    Wt Readings from Last 3 Encounters:  11/29/22 171 lb (77.6 kg)  07/17/22 178 lb (80.7 kg)  04/05/22 193 lb (87.5 kg)    Physical Exam Vitals reviewed.  HENT:  Nose: Nose normal.     Mouth/Throat:     Mouth: Mucous membranes are moist.  Eyes:     General: No scleral icterus.    Conjunctiva/sclera: Conjunctivae normal.  Cardiovascular:     Rate and Rhythm: Normal rate and regular rhythm.     Heart sounds: No murmur heard. Pulmonary:     Effort: Pulmonary effort is normal.     Breath sounds: No stridor. No wheezing, rhonchi or rales.  Abdominal:     General: Abdomen is flat.     Palpations: There is no mass.     Tenderness: There is no abdominal tenderness. There is no guarding.     Hernia: No hernia is present.  Musculoskeletal:        General: Normal range of motion.     Cervical back: Neck supple.     Right lower  leg: No edema.     Left lower leg: No edema.  Lymphadenopathy:     Cervical: No cervical adenopathy.  Skin:    General: Skin is warm and dry.  Neurological:     General: No focal deficit present.     Mental Status: She is alert. Mental status is at baseline.  Psychiatric:        Mood and Affect: Mood normal.        Behavior: Behavior normal.     Lab Results  Component Value Date   WBC 3.6 (L) 11/29/2022   HGB 11.7 (L) 11/29/2022   HCT 34.9 (L) 11/29/2022   PLT 184.0 11/29/2022   GLUCOSE 111 (H) 11/29/2022   CHOL 102 04/05/2022   TRIG 56.0 04/05/2022   HDL 46.10 04/05/2022   LDLDIRECT 138.6 04/15/2013   LDLCALC 45 04/05/2022   ALT 21 04/05/2022   AST 19 04/05/2022   NA 139 11/29/2022   K 4.3 11/29/2022   CL 103 11/29/2022   CREATININE 0.96 11/29/2022   BUN 21 11/29/2022   CO2 29 11/29/2022   TSH 0.94 11/29/2022   HGBA1C 6.4 11/29/2022   MICROALBUR 1.5 04/05/2022    MM DIAG BREAST TOMO BILATERAL  Result Date: 08/04/2021 CLINICAL DATA:  62 year old female for delayed 1 year follow-up of LEFT breast calcifications (now 2 years) and for annual bilateral mammogram. EXAM: DIGITAL DIAGNOSTIC BILATERAL MAMMOGRAM WITH CAD AND TOMO COMPARISON:  Previous exam(s). ACR Breast Density Category b: There are scattered areas of fibroglandular density. FINDINGS: Full field views of both breast and magnification views of the LEFT breast are performed. The 2 groups of UPPER-OUTER LEFT breast calcifications are again noted with decreasing number of calcifications in each group. No new or suspicious findings are noted within either breast. Mammographic images were processed with CAD. IMPRESSION: 1. Decreased number of UPPER-OUTER LEFT breast calcifications without new or suspicious abnormality, considered benign given follow-up for 2 years. 2. No evidence of breast malignancy within either breast. RECOMMENDATION: Bilateral screening mammogram in 1 year. I have discussed the findings and  recommendations with the patient. If applicable, a reminder letter will be sent to the patient regarding the next appointment. BI-RADS CATEGORY  2: Benign. Electronically Signed   By: Margarette Canada M.D.   On: 08/04/2021 09:01   Assessment & Plan:  Hypertension associated with diabetes ([place])- She has not achieved her blood pressure goal.  Will add an ARB. -     Olmesartan Medoxomil; Take 1 tablet (20 mg total) by mouth daily.  Dispense: 90 tablet; Refill: 1 -     Basic metabolic panel; Future -  CBC with Differential/Platelet; Future -     TSH; Future  Essential hypertension, benign -     Basic metabolic panel; Future -     CBC with Differential/Platelet; Future  LVH (left ventricular hypertrophy) due to hypertensive disease, without heart failure -     Olmesartan Medoxomil; Take 1 tablet (20 mg total) by mouth daily.  Dispense: 90 tablet; Refill: 1  Stage 3a chronic kidney disease ([place])- Her renal function is stable. -     Basic metabolic panel; Future  Thiamine deficiency neuropathy -     CBC with Differential/Platelet; Future -     Vitamin B-1; Take 1 tablet (50 mg total) by mouth daily.  Dispense: 90 tablet; Refill: 1  Type II diabetes mellitus with manifestations ([place])- Her A1c is down to 6.4%.  Will discontinue metformin and continue the GLP-1 agonist. -     Basic metabolic panel; Future -     Hemoglobin A1c; Future -     HM Diabetes Foot Exam -     Semaglutide (2 MG/DOSE); Inject 2 mg as directed once a week.  Dispense: 9 mL; Refill: 0  Deficiency anemia- Will evaluate for vitamin deficiencies. -     Reticulocytes; Future -     Zinc; Future -     IBC + Ferritin; Future -     Vitamin B12; Future -     Folate; Future     Follow-up: Return in about 6 months (around 06/01/2023).  Scarlette Calico, MD

## 2022-12-11 ENCOUNTER — Ambulatory Visit
Admission: RE | Admit: 2022-12-11 | Discharge: 2022-12-11 | Disposition: A | Discharge status: Home / Self Care | Payer: BC Managed Care – PPO | Source: Ambulatory Visit | Attending: Internal Medicine | Admitting: Internal Medicine

## 2022-12-11 DIAGNOSIS — Z1231 Encounter for screening mammogram for malignant neoplasm of breast: Secondary | ICD-10-CM

## 2022-12-12 ENCOUNTER — Telehealth: Payer: Self-pay

## 2022-12-12 NOTE — Telephone Encounter (Signed)
Pharmacy Patient Advocate Encounter  Prior Authorization for Ozempic has been approved by [employer] [place] (ins).    [place] # [id number] Effective dates: 12/07/2022 through 12/07/2023

## 2023-01-30 LAB — HM DIABETES EYE EXAM

## 2023-02-04 DIAGNOSIS — H2513 Age-related nuclear cataract, bilateral: Secondary | ICD-10-CM | POA: Diagnosis not present

## 2023-02-04 DIAGNOSIS — E113291 Type 2 diabetes mellitus with mild nonproliferative diabetic retinopathy without macular edema, right eye: Secondary | ICD-10-CM | POA: Diagnosis not present

## 2023-02-04 DIAGNOSIS — H5213 Myopia, bilateral: Secondary | ICD-10-CM | POA: Diagnosis not present

## 2023-02-13 ENCOUNTER — Other Ambulatory Visit: Payer: Self-pay | Admitting: Internal Medicine

## 2023-02-13 DIAGNOSIS — E118 Type 2 diabetes mellitus with unspecified complications: Secondary | ICD-10-CM

## 2023-02-20 ENCOUNTER — Ambulatory Visit: Payer: BC Managed Care – PPO | Admitting: Internal Medicine

## 2023-02-20 ENCOUNTER — Encounter: Payer: Self-pay | Admitting: Internal Medicine

## 2023-02-20 VITALS — BP 138/82 | HR 85 | Temp 98.3°F | Ht 65.0 in | Wt 172.0 lb

## 2023-02-20 DIAGNOSIS — E118 Type 2 diabetes mellitus with unspecified complications: Secondary | ICD-10-CM | POA: Diagnosis not present

## 2023-02-20 DIAGNOSIS — I152 Hypertension secondary to endocrine disorders: Secondary | ICD-10-CM

## 2023-02-20 DIAGNOSIS — E1159 Type 2 diabetes mellitus with other circulatory complications: Secondary | ICD-10-CM

## 2023-02-20 DIAGNOSIS — N1831 Chronic kidney disease, stage 3a: Secondary | ICD-10-CM | POA: Diagnosis not present

## 2023-02-20 DIAGNOSIS — I10 Essential (primary) hypertension: Secondary | ICD-10-CM

## 2023-02-20 DIAGNOSIS — E785 Hyperlipidemia, unspecified: Secondary | ICD-10-CM

## 2023-02-20 DIAGNOSIS — E5111 Dry beriberi: Secondary | ICD-10-CM | POA: Diagnosis not present

## 2023-02-20 DIAGNOSIS — Z7985 Long-term (current) use of injectable non-insulin antidiabetic drugs: Secondary | ICD-10-CM

## 2023-02-20 DIAGNOSIS — I119 Hypertensive heart disease without heart failure: Secondary | ICD-10-CM

## 2023-02-20 LAB — CBC WITH DIFFERENTIAL/PLATELET
Basophils Absolute: 0 10*3/uL (ref 0.0–0.1)
Basophils Relative: 1 % (ref 0.0–3.0)
Eosinophils Absolute: 0.2 10*3/uL (ref 0.0–0.7)
Eosinophils Relative: 5.6 % — ABNORMAL HIGH (ref 0.0–5.0)
HCT: 36.8 % (ref 36.0–46.0)
Hemoglobin: 12 g/dL (ref 12.0–15.0)
Lymphocytes Relative: 26.7 % (ref 12.0–46.0)
Lymphs Abs: 1.1 10*3/uL (ref 0.7–4.0)
MCHC: 32.6 g/dL (ref 30.0–36.0)
MCV: 95.6 fl (ref 78.0–100.0)
Monocytes Absolute: 0.4 10*3/uL (ref 0.1–1.0)
Monocytes Relative: 9.8 % (ref 3.0–12.0)
Neutro Abs: 2.2 10*3/uL (ref 1.4–7.7)
Neutrophils Relative %: 56.9 % (ref 43.0–77.0)
Platelets: 201 10*3/uL (ref 150.0–400.0)
RBC: 3.85 Mil/uL — ABNORMAL LOW (ref 3.87–5.11)
RDW: 12.9 % (ref 11.5–15.5)
WBC: 3.9 10*3/uL — ABNORMAL LOW (ref 4.0–10.5)

## 2023-02-20 LAB — BASIC METABOLIC PANEL
BUN: 19 mg/dL (ref 6–23)
CO2: 24 mEq/L (ref 19–32)
Calcium: 9.6 mg/dL (ref 8.4–10.5)
Chloride: 100 mEq/L (ref 96–112)
Creatinine, Ser: 0.93 mg/dL (ref 0.40–1.20)
GFR: 66.38 mL/min (ref >60.00)
Glucose, Bld: 111 mg/dL — ABNORMAL HIGH (ref 70–99)
Potassium: 4.8 mEq/L (ref 3.5–5.1)
Sodium: 137 mEq/L (ref 135–145)

## 2023-02-20 LAB — HEMOGLOBIN A1C: Hgb A1c MFr Bld: 6 % (ref 4.6–6.5)

## 2023-02-20 LAB — URINALYSIS, ROUTINE W REFLEX MICROSCOPIC
Bilirubin Urine: NEGATIVE
Hgb urine dipstick: NEGATIVE
Ketones, ur: NEGATIVE
Nitrite: NEGATIVE
Specific Gravity, Urine: 1.02 (ref 1.000–1.030)
Total Protein, Urine: NEGATIVE
Urine Glucose: NEGATIVE
Urobilinogen, UA: 0.2 (ref 0.0–1.0)
pH: 6 (ref 5.0–8.0)

## 2023-02-20 LAB — LIPID PANEL
Cholesterol: 195 mg/dL (ref 0–200)
HDL: 74.4 mg/dL (ref >39.00)
LDL Cholesterol: 110 mg/dL — ABNORMAL HIGH (ref 0–99)
NonHDL: 121.01
Total CHOL/HDL Ratio: 3
Triglycerides: 55 mg/dL (ref 0.0–149.0)
VLDL: 11 mg/dL (ref 0.0–40.0)

## 2023-02-20 LAB — MICROALBUMIN / CREATININE URINE RATIO
Creatinine,U: 133.3 mg/dL
Microalb Creat Ratio: 0.5 mg/g (ref 0.0–30.0)
Microalb, Ur: <0.7 mg/dL (ref 0.0–1.9)

## 2023-02-20 MED ORDER — OLMESARTAN MEDOXOMIL 20 MG PO TABS: 20.0000 mg | ORAL_TABLET | Freq: Every day | ORAL | 0 refills | Status: DC

## 2023-02-20 MED ORDER — INDAPAMIDE 1.25 MG PO TABS: 1.2500 mg | ORAL_TABLET | Freq: Every day | ORAL | 0 refills | Status: DC

## 2023-02-20 MED ORDER — SEMAGLUTIDE (2 MG/DOSE) 8 MG/3ML ~~LOC~~ SOPN
2.0000 mg | PEN_INJECTOR | SUBCUTANEOUS | 1 refills | Status: DC
Start: 2023-02-20 — End: 2023-10-16

## 2023-02-20 MED ORDER — CARVEDILOL 6.25 MG PO TABS: 6.2500 mg | ORAL_TABLET | Freq: Two times a day (BID) | ORAL | 0 refills | Status: DC

## 2023-02-20 MED ORDER — VITAMIN B-1 50 MG PO TABS
50.0000 mg | ORAL_TABLET | Freq: Every day | ORAL | 1 refills | Status: DC
Start: 2023-02-20 — End: 2023-11-26

## 2023-02-20 NOTE — Progress Notes (Signed)
Subjective:  Patient ID: Virginia Jimenez, female    DOB: [birth year]  Age: 62 y.o. MRN: [id number]  CC: Anemia, Hypertension, and Diabetes   HPI Virginia Jimenez presents for f/up ----  She is active and denies DOE, CP, SOB, edema.  Outpatient Medications Prior to Visit  Medication Sig Dispense Refill   Glucagon (GVOKE HYPOPEN 2-PACK) 1 MG/0.2ML SOAJ Inject 1 Act into the skin daily as needed. 2 mL 5   rosuvastatin (CRESTOR) 20 MG tablet TAKE 1 TABLET BY MOUTH DAILY 90 tablet 0   traMADol (ULTRAM) 50 MG tablet Take 1 tablet (50 mg total) by mouth every 6 (six) hours as needed. 75 tablet 3   carvedilol (COREG) 6.25 MG tablet TAKE 1 TABLET BY MOUTH TWICE A DAY WITH MEALS 180 tablet 1   indapamide (LOZOL) 1.25 MG tablet TAKE ONE TABLET BY MOUTH DAILY 90 tablet 1   olmesartan (BENICAR) 20 MG tablet Take 1 tablet (20 mg total) by mouth daily. 90 tablet 1   Semaglutide, 2 MG/DOSE, 8 MG/3ML SOPN Inject 2 mg as directed once a week. 9 mL 0   thiamine (VITAMIN B-1) 50 MG tablet Take 1 tablet (50 mg total) by mouth daily. 90 tablet 1   No facility-administered medications prior to visit.    ROS Review of Systems  Constitutional:  Negative for chills, diaphoresis and fatigue.  HENT: Negative.    Eyes: Negative.   Respiratory:  Negative for cough, chest tightness, shortness of breath and wheezing.   Cardiovascular:  Negative for chest pain, palpitations and leg swelling.  Gastrointestinal: Negative.  Negative for abdominal pain, diarrhea, nausea and vomiting.  Endocrine: Negative.   Genitourinary: Negative.  Negative for difficulty urinating.  Musculoskeletal: Negative.   Skin: Negative.   Neurological:  Negative for dizziness, weakness, light-headedness and headaches.  Hematological:  Negative for adenopathy. Does not bruise/bleed easily.  Psychiatric/Behavioral: Negative.  Negative for sleep disturbance.     Objective:  BP 138/82 (BP Location: Left Arm, Patient Position:  Sitting, Cuff Size: Large)   Pulse 85   Temp 98.3 F (36.8 C) (Oral)   Ht 5\' 5"  (1.651 m)   Wt 172 lb (78 kg)   LMP 06/01/2012   SpO2 96%   BMI 28.62 kg/m   BP Readings from Last 3 Encounters:  02/20/23 138/82  11/29/22 (!) 144/78  07/17/22 (!) 144/78    Wt Readings from Last 3 Encounters:  02/20/23 172 lb (78 kg)  11/29/22 171 lb (77.6 kg)  07/17/22 178 lb (80.7 kg)    Physical Exam Vitals reviewed.  Constitutional:      Appearance: Normal appearance.  HENT:     Mouth/Throat:     Mouth: Mucous membranes are moist.  Eyes:     General: No scleral icterus.    Conjunctiva/sclera: Conjunctivae normal.  Cardiovascular:     Rate and Rhythm: Normal rate and regular rhythm.     Heart sounds: No murmur heard. Pulmonary:     Effort: Pulmonary effort is normal.     Breath sounds: No stridor. No wheezing, rhonchi or rales.  Abdominal:     General: Abdomen is flat.     Palpations: There is no mass.     Tenderness: There is no abdominal tenderness. There is no guarding.     Hernia: No hernia is present.  Musculoskeletal:        General: Normal range of motion.     Cervical back: Neck supple.     Right lower leg:  No edema.     Left lower leg: No edema.  Lymphadenopathy:     Cervical: No cervical adenopathy.  Skin:    General: Skin is warm and dry.  Neurological:     General: No focal deficit present.     Mental Status: She is alert and oriented to person, place, and time.  Psychiatric:        Mood and Affect: Mood normal.        Behavior: Behavior normal.     Lab Results  Component Value Date   WBC 3.9 (L) 02/20/2023   HGB 12.0 02/20/2023   HCT 36.8 02/20/2023   PLT 201.0 02/20/2023   GLUCOSE 111 (H) 02/20/2023   CHOL 195 02/20/2023   TRIG 55.0 02/20/2023   HDL 74.40 02/20/2023   LDLDIRECT 138.6 04/15/2013   LDLCALC 110 (H) 02/20/2023   ALT 21 04/05/2022   AST 19 04/05/2022   NA 137 02/20/2023   K 4.8 02/20/2023   CL 100 02/20/2023   CREATININE 0.93  02/20/2023   BUN 19 02/20/2023   CO2 24 02/20/2023   TSH 0.94 11/29/2022   HGBA1C 6.0 02/20/2023   MICROALBUR <0.7 02/20/2023    MM 3D SCREEN BREAST BILATERAL  Result Date: 12/12/2022 CLINICAL DATA:  Screening. EXAM: DIGITAL SCREENING BILATERAL MAMMOGRAM WITH TOMOSYNTHESIS AND CAD TECHNIQUE: Bilateral screening digital craniocaudal and mediolateral oblique mammograms were obtained. Bilateral screening digital breast tomosynthesis was performed. The images were evaluated with computer-aided detection. COMPARISON:  Previous exam(s). ACR Breast Density Category b: There are scattered areas of fibroglandular density. FINDINGS: There are no findings suspicious for malignancy. IMPRESSION: No mammographic evidence of malignancy. A result letter of this screening mammogram will be mailed directly to the patient. RECOMMENDATION: Screening mammogram in one year. (Code:SM-B-01Y) BI-RADS CATEGORY  1: Negative. Electronically Signed   By: Annia Belt M.D.   On: 12/12/2022 06:14    Assessment & Plan:   Stage 3a chronic kidney disease (HCC)- Her renal function is stable. -     Basic metabolic panel; Future -     Urinalysis, Routine w reflex microscopic; Future -     Microalbumin / creatinine urine ratio; Future  Thiamine deficiency neuropathy -     Vitamin B-1; Take 1 tablet (50 mg total) by mouth daily.  Dispense: 90 tablet; Refill: 1 -     CBC with Differential/Platelet; Future  Type II diabetes mellitus with manifestations (HCC)- Her blood sugar is very well controlled. -     Hemoglobin A1c; Future -     Semaglutide (2 MG/DOSE); Inject 2 mg as directed once a week.  Dispense: 9 mL; Refill: 1  Hypertension associated with diabetes (HCC)- Her BP is well controlled. -     Basic metabolic panel; Future -     Carvedilol; Take 1 tablet (6.25 mg total) by mouth 2 (two) times daily with a meal.  Dispense: 180 tablet; Refill: 0 -     Indapamide; Take 1 tablet (1.25 mg total) by mouth daily.  Dispense: 90  tablet; Refill: 0 -     Olmesartan Medoxomil; Take 1 tablet (20 mg total) by mouth daily.  Dispense: 90 tablet; Refill: 0  Hyperlipidemia with target LDL less than 100 -     Lipid panel; Future  Essential hypertension, benign -     Carvedilol; Take 1 tablet (6.25 mg total) by mouth 2 (two) times daily with a meal.  Dispense: 180 tablet; Refill: 0 -     Indapamide; Take 1 tablet (1.25  mg total) by mouth daily.  Dispense: 90 tablet; Refill: 0  LVH (left ventricular hypertrophy) due to hypertensive disease, without heart failure -     Carvedilol; Take 1 tablet (6.25 mg total) by mouth 2 (two) times daily with a meal.  Dispense: 180 tablet; Refill: 0 -     Indapamide; Take 1 tablet (1.25 mg total) by mouth daily.  Dispense: 90 tablet; Refill: 0 -     Olmesartan Medoxomil; Take 1 tablet (20 mg total) by mouth daily.  Dispense: 90 tablet; Refill: 0     Follow-up: Return in about 6 months (around 08/22/2023).  Sanda Linger, MD

## 2023-02-20 NOTE — Patient Instructions (Signed)

## 2023-02-22 ENCOUNTER — Encounter: Payer: Self-pay | Admitting: Internal Medicine

## 2023-03-02 ENCOUNTER — Other Ambulatory Visit: Payer: Self-pay | Admitting: Internal Medicine

## 2023-03-02 DIAGNOSIS — E785 Hyperlipidemia, unspecified: Secondary | ICD-10-CM

## 2023-05-23 ENCOUNTER — Other Ambulatory Visit: Payer: Self-pay | Admitting: Internal Medicine

## 2023-05-23 DIAGNOSIS — I10 Essential (primary) hypertension: Secondary | ICD-10-CM

## 2023-05-23 DIAGNOSIS — I119 Hypertensive heart disease without heart failure: Secondary | ICD-10-CM

## 2023-05-23 DIAGNOSIS — E1159 Type 2 diabetes mellitus with other circulatory complications: Secondary | ICD-10-CM

## 2023-05-23 DIAGNOSIS — I152 Hypertension secondary to endocrine disorders: Secondary | ICD-10-CM

## 2023-06-18 ENCOUNTER — Telehealth: Payer: Self-pay

## 2023-06-18 ENCOUNTER — Other Ambulatory Visit (HOSPITAL_COMMUNITY): Payer: Self-pay

## 2023-06-18 NOTE — Telephone Encounter (Signed)
Pharmacy Patient Advocate Encounter   Received notification from CoverMyMeds that prior authorization for Ozempic is required/requested.   Insurance verification completed.   The patient is insured through [facility] .   Per test claim: PA required; PA submitted to [facility] via CoverMyMeds Key/confirmation #/EOC (Key: [id number])  Status is pending

## 2023-06-19 ENCOUNTER — Other Ambulatory Visit (HOSPITAL_COMMUNITY): Payer: Self-pay

## 2023-06-19 NOTE — Telephone Encounter (Signed)
Pharmacy Patient Advocate Encounter  Received notification from [facility] that Prior Authorization for [facility] has been APPROVED from 10.1.24 to 10.1.27. Ran test claim, and The Rx was last filled on 05/17/23 and is payable again on/after 07/19/23.    This test claim was processed through [facility]- copay amounts may vary at other pharmacies due to pharmacy/plan contracts, or as the patient moves through the different stages of their insurance plan.   PA #/Case ID/Reference #: (Key: [id number])

## 2023-08-23 ENCOUNTER — Other Ambulatory Visit: Payer: Self-pay | Admitting: Internal Medicine

## 2023-08-23 DIAGNOSIS — E118 Type 2 diabetes mellitus with unspecified complications: Secondary | ICD-10-CM

## 2023-09-14 ENCOUNTER — Other Ambulatory Visit: Payer: Self-pay | Admitting: Internal Medicine

## 2023-09-14 DIAGNOSIS — E118 Type 2 diabetes mellitus with unspecified complications: Secondary | ICD-10-CM

## 2023-09-23 ENCOUNTER — Other Ambulatory Visit: Payer: Self-pay | Admitting: Internal Medicine

## 2023-09-23 DIAGNOSIS — E118 Type 2 diabetes mellitus with unspecified complications: Secondary | ICD-10-CM

## 2023-09-26 ENCOUNTER — Other Ambulatory Visit: Payer: Self-pay | Admitting: Internal Medicine

## 2023-09-26 DIAGNOSIS — I119 Hypertensive heart disease without heart failure: Secondary | ICD-10-CM

## 2023-09-26 DIAGNOSIS — I10 Essential (primary) hypertension: Secondary | ICD-10-CM

## 2023-09-26 DIAGNOSIS — E785 Hyperlipidemia, unspecified: Secondary | ICD-10-CM

## 2023-09-26 DIAGNOSIS — E1159 Type 2 diabetes mellitus with other circulatory complications: Secondary | ICD-10-CM

## 2023-10-15 ENCOUNTER — Other Ambulatory Visit (HOSPITAL_COMMUNITY): Payer: Self-pay

## 2023-10-16 ENCOUNTER — Other Ambulatory Visit: Payer: Self-pay | Admitting: Internal Medicine

## 2023-10-16 DIAGNOSIS — I152 Hypertension secondary to endocrine disorders: Secondary | ICD-10-CM

## 2023-10-16 DIAGNOSIS — I10 Essential (primary) hypertension: Secondary | ICD-10-CM

## 2023-10-16 DIAGNOSIS — I119 Hypertensive heart disease without heart failure: Secondary | ICD-10-CM

## 2023-10-16 DIAGNOSIS — E118 Type 2 diabetes mellitus with unspecified complications: Secondary | ICD-10-CM

## 2023-10-16 DIAGNOSIS — E785 Hyperlipidemia, unspecified: Secondary | ICD-10-CM

## 2023-10-16 NOTE — Telephone Encounter (Signed)
Copied from CRM [id number]. Topic: Clinical - Medication Refill >> Oct 16, 2023  2:09 PM Drema Balzarine wrote: Most Recent Primary Care Visit:  Provider: Etta Grandchild  Department: [facility]  Visit Type: OFFICE VISIT  Date: 02/20/2023  Medication: Carvedilol, Rosuvastatin & Semaglutide  Has the patient contacted their pharmacy? Yes, advised to see doctor   (Agent: If no, request that the patient contact the pharmacy for the refill. If patient does not wish to contact the pharmacy document the reason why and proceed with request.) (Agent: If yes, when and what did the pharmacy advise?)  Is this the correct pharmacy for this prescription? Yes If no, delete pharmacy and type the correct one.  This is the patient's preferred pharmacy:   [facility] [id number] - [place], [place] - [address] [place] [address] Phone: [phone] Fax: [phone]   Has the prescription been filled recently? Yes  Is the patient out of the medication? Yes  Has the patient been seen for an appointment in the last year OR does the patient have an upcoming appointment? Yes, appt scheduled 11/26/23  Can we respond through MyChart? No  Agent: Please be advised that Rx refills may take up to 3 business days. We ask that you follow-up with your pharmacy.

## 2023-10-17 MED ORDER — ROSUVASTATIN CALCIUM 20 MG PO TABS: 20.0000 mg | ORAL_TABLET | Freq: Every day | ORAL | 0 refills | Status: DC

## 2023-10-17 MED ORDER — CARVEDILOL 6.25 MG PO TABS: 6.2500 mg | ORAL_TABLET | Freq: Two times a day (BID) | ORAL | 0 refills | Status: DC

## 2023-10-17 MED ORDER — SEMAGLUTIDE (2 MG/DOSE) 8 MG/3ML ~~LOC~~ SOPN: 2.0000 mg | PEN_INJECTOR | SUBCUTANEOUS | 1 refills | Status: DC

## 2023-11-26 ENCOUNTER — Encounter: Payer: Self-pay | Admitting: Internal Medicine

## 2023-11-26 ENCOUNTER — Ambulatory Visit: Payer: BC Managed Care – PPO | Admitting: Internal Medicine

## 2023-11-26 VITALS — BP 168/86 | HR 74 | Temp 98.3°F | Resp 16 | Ht 65.0 in | Wt 194.6 lb

## 2023-11-26 DIAGNOSIS — R0989 Other specified symptoms and signs involving the circulatory and respiratory systems: Secondary | ICD-10-CM | POA: Diagnosis not present

## 2023-11-26 DIAGNOSIS — B351 Tinea unguium: Secondary | ICD-10-CM | POA: Diagnosis not present

## 2023-11-26 DIAGNOSIS — I152 Hypertension secondary to endocrine disorders: Secondary | ICD-10-CM | POA: Diagnosis not present

## 2023-11-26 DIAGNOSIS — E5111 Dry beriberi: Secondary | ICD-10-CM

## 2023-11-26 DIAGNOSIS — Z124 Encounter for screening for malignant neoplasm of cervix: Secondary | ICD-10-CM | POA: Insufficient documentation

## 2023-11-26 DIAGNOSIS — E118 Type 2 diabetes mellitus with unspecified complications: Secondary | ICD-10-CM

## 2023-11-26 DIAGNOSIS — E785 Hyperlipidemia, unspecified: Secondary | ICD-10-CM | POA: Diagnosis not present

## 2023-11-26 DIAGNOSIS — I10 Essential (primary) hypertension: Secondary | ICD-10-CM

## 2023-11-26 DIAGNOSIS — Z1231 Encounter for screening mammogram for malignant neoplasm of breast: Secondary | ICD-10-CM | POA: Insufficient documentation

## 2023-11-26 DIAGNOSIS — E1159 Type 2 diabetes mellitus with other circulatory complications: Secondary | ICD-10-CM

## 2023-11-26 DIAGNOSIS — Z23 Encounter for immunization: Secondary | ICD-10-CM | POA: Diagnosis not present

## 2023-11-26 DIAGNOSIS — Z0001 Encounter for general adult medical examination with abnormal findings: Secondary | ICD-10-CM

## 2023-11-26 LAB — CBC WITH DIFFERENTIAL/PLATELET
Basophils Absolute: 0 10*3/uL (ref 0.0–0.1)
Basophils Relative: 0.7 % (ref 0.0–3.0)
Eosinophils Absolute: 0.1 10*3/uL (ref 0.0–0.7)
Eosinophils Relative: 2.6 % (ref 0.0–5.0)
HCT: 35.8 % — ABNORMAL LOW (ref 36.0–46.0)
Hemoglobin: 12 g/dL (ref 12.0–15.0)
Lymphocytes Relative: 35.8 % (ref 12.0–46.0)
Lymphs Abs: 1.1 10*3/uL (ref 0.7–4.0)
MCHC: 33.4 g/dL (ref 30.0–36.0)
MCV: 96.3 fl (ref 78.0–100.0)
Monocytes Absolute: 0.3 10*3/uL (ref 0.1–1.0)
Monocytes Relative: 10.1 % (ref 3.0–12.0)
Neutro Abs: 1.6 10*3/uL (ref 1.4–7.7)
Neutrophils Relative %: 50.8 % (ref 43.0–77.0)
Platelets: 178 10*3/uL (ref 150.0–400.0)
RBC: 3.72 Mil/uL — ABNORMAL LOW (ref 3.87–5.11)
RDW: 12.4 % (ref 11.5–15.5)
WBC: 3.1 10*3/uL — ABNORMAL LOW (ref 4.0–10.5)

## 2023-11-26 LAB — HEPATIC FUNCTION PANEL
ALT: 25 U/L (ref 0–35)
AST: 19 U/L (ref 0–37)
Albumin: 4.1 g/dL (ref 3.5–5.2)
Alkaline Phosphatase: 61 U/L (ref 39–117)
Bilirubin, Direct: 0.1 mg/dL (ref 0.0–0.3)
Total Bilirubin: 0.4 mg/dL (ref 0.2–1.2)
Total Protein: 7.4 g/dL (ref 6.0–8.3)

## 2023-11-26 LAB — URINALYSIS, ROUTINE W REFLEX MICROSCOPIC
Bilirubin Urine: NEGATIVE
Hgb urine dipstick: NEGATIVE
Ketones, ur: NEGATIVE
Nitrite: NEGATIVE
Specific Gravity, Urine: 1.015 (ref 1.000–1.030)
Total Protein, Urine: NEGATIVE
Urine Glucose: NEGATIVE
Urobilinogen, UA: 0.2 (ref 0.0–1.0)
pH: 7 (ref 5.0–8.0)

## 2023-11-26 LAB — LIPID PANEL
Cholesterol: 137 mg/dL (ref 0–200)
HDL: 64.9 mg/dL (ref >39.00)
LDL Cholesterol: 65 mg/dL (ref 0–99)
NonHDL: 72.22
Total CHOL/HDL Ratio: 2
Triglycerides: 36 mg/dL (ref 0.0–149.0)
VLDL: 7.2 mg/dL (ref 0.0–40.0)

## 2023-11-26 LAB — BASIC METABOLIC PANEL
BUN: 17 mg/dL (ref 6–23)
CO2: 30 meq/L (ref 19–32)
Calcium: 9.6 mg/dL (ref 8.4–10.5)
Chloride: 100 meq/L (ref 96–112)
Creatinine, Ser: 0.91 mg/dL (ref 0.40–1.20)
GFR: 67.77 mL/min (ref >60.00)
Glucose, Bld: 150 mg/dL — ABNORMAL HIGH (ref 70–99)
Potassium: 4.2 meq/L (ref 3.5–5.1)
Sodium: 136 meq/L (ref 135–145)

## 2023-11-26 LAB — TSH: TSH: 1.38 u[IU]/mL (ref 0.35–5.50)

## 2023-11-26 LAB — HEMOGLOBIN A1C: Hgb A1c MFr Bld: 6.8 % — ABNORMAL HIGH (ref 4.6–6.5)

## 2023-11-26 MED ORDER — INDAPAMIDE 1.25 MG PO TABS: 1.2500 mg | ORAL_TABLET | Freq: Every day | ORAL | 0 refills | Status: DC

## 2023-11-26 MED ORDER — TIRZEPATIDE 2.5 MG/0.5ML ~~LOC~~ SOAJ: 2.5000 mg | SUBCUTANEOUS | 0 refills | Status: DC

## 2023-11-26 MED ORDER — ROSUVASTATIN CALCIUM 20 MG PO TABS: 20.0000 mg | ORAL_TABLET | Freq: Every day | ORAL | 0 refills | Status: DC

## 2023-11-26 MED ORDER — OLMESARTAN MEDOXOMIL 20 MG PO TABS: 20.0000 mg | ORAL_TABLET | Freq: Every day | ORAL | 0 refills | Status: DC

## 2023-11-26 MED ORDER — VITAMIN B-1 50 MG PO TABS: 50.0000 mg | ORAL_TABLET | Freq: Every day | ORAL | 1 refills | Status: AC

## 2023-11-26 MED ORDER — CARVEDILOL 6.25 MG PO TABS: 6.2500 mg | ORAL_TABLET | Freq: Two times a day (BID) | ORAL | 0 refills | Status: DC

## 2023-11-26 NOTE — Patient Instructions (Signed)

## 2023-11-26 NOTE — Progress Notes (Signed)
 Subjective:  Patient ID: Virginia Jimenez, female    DOB: 06/26/1961  Age: 63 y.o. MRN: [id number]  CC: Annual Exam, Hypertension, Diabetes, and Hyperlipidemia   HPI Davon Folta presents for a CPX and f/up ---  Discussed the use of AI scribe software for clinical note transcription with the patient, who gave verbal consent to proceed.  History of Present Illness   The patient presents for follow-up regarding elevated blood pressure and diabetes management.   She is currently managing her elevated blood pressure with carvedilol but has not been taking indapamide or Olmesartan. No symptoms such as headache, blurred vision, chest pain, shortness of breath, or swelling in her legs or feet.  Regarding diabetes management, she experienced a lapse in medication adherence with Ozempic for about a month and a half, which she feels has affected its efficacy. No diabetic symptoms such as excessive thirst, excessive urination, or drastic changes in appetite. Her last A1c test was conducted in June of the previous year.  She mentions discoloration of her toenails, particularly the big ones, which are turning white or dark. She has not had any prior treatment for this issue.  In terms of preventive health measures, she states that it is time for her to have a mammogram and an eye exam, as her last mammogram was last year and her last eye exam was around this time last year. Her last colonoscopy was about two and a half years ago, and she needs a Pap smear but does not recall the last time she had one.  She denies being a smoker or drinker and reports taking vitamin B1, although she sometimes experiences numbness or tingling.       Outpatient Medications Prior to Visit  Medication Sig Dispense Refill   traMADol (ULTRAM) 50 MG tablet Take 1 tablet (50 mg total) by mouth every 6 (six) hours as needed. 75 tablet 3   carvedilol (COREG) 6.25 MG tablet Take 1 tablet (6.25 mg total) by mouth 2  (two) times daily with a meal. 180 tablet 0   indapamide (LOZOL) 1.25 MG tablet TAKE 1 TABLET BY MOUTH DAILY 90 tablet 0   olmesartan (BENICAR) 20 MG tablet Take 1 tablet (20 mg total) by mouth daily. 90 tablet 0   rosuvastatin (CRESTOR) 20 MG tablet Take 1 tablet (20 mg total) by mouth daily. 90 tablet 0   Semaglutide, 2 MG/DOSE, 8 MG/3ML SOPN Inject 2 mg as directed once a week. 9 mL 1   thiamine (VITAMIN B-1) 50 MG tablet Take 1 tablet (50 mg total) by mouth daily. 90 tablet 1   Glucagon (GVOKE HYPOPEN 2-PACK) 1 MG/0.2ML SOAJ Inject 1 Act into the skin daily as needed. 2 mL 5   No facility-administered medications prior to visit.    ROS Review of Systems  Constitutional:  Positive for unexpected weight change (wt gain). Negative for appetite change, chills, diaphoresis and fatigue.  HENT: Negative.    Eyes: Negative.   Respiratory: Negative.  Negative for cough, chest tightness, shortness of breath and wheezing.   Cardiovascular:  Negative for chest pain, palpitations and leg swelling.  Gastrointestinal: Negative.  Negative for abdominal pain, constipation, diarrhea, nausea and vomiting.  Endocrine: Negative.   Genitourinary:  Negative for difficulty urinating and dysuria.  Musculoskeletal:  Negative for arthralgias, back pain and myalgias.  Skin: Negative.   Neurological: Negative.  Negative for dizziness, weakness and light-headedness.  Hematological:  Negative for adenopathy. Does not bruise/bleed easily.  Psychiatric/Behavioral: Negative.  Objective:  BP (!) 168/86 (BP Location: Right Arm, Patient Position: Sitting, Cuff Size: Large) Comment: BP (L) 182/90  Pulse 74   Temp 98.3 F (36.8 C) (Oral)   Resp 16   Ht 5\' 5"  (1.651 m)   Wt 194 lb 9.6 oz (88.3 kg)   LMP 06/01/2012   SpO2 99%   BMI 32.38 kg/m   BP Readings from Last 3 Encounters:  11/26/23 (!) 168/86  02/20/23 138/82  11/29/22 (!) 144/78    Wt Readings from Last 3 Encounters:  11/26/23 194 lb 9.6 oz  (88.3 kg)  02/20/23 172 lb (78 kg)  11/29/22 171 lb (77.6 kg)    Physical Exam Vitals reviewed.  Constitutional:      Appearance: Normal appearance.  HENT:     Nose: Nose normal.     Mouth/Throat:     Mouth: Mucous membranes are moist.  Eyes:     General: No scleral icterus.    Conjunctiva/sclera: Conjunctivae normal.  Cardiovascular:     Rate and Rhythm: Normal rate and regular rhythm.     Heart sounds: Normal heart sounds, S1 normal and S2 normal. No murmur heard.    No gallop.     Comments: EKG- NSR, 71 bpm No LVH, Q waves, or ST/T wave changes  Pulmonary:     Effort: Pulmonary effort is normal.     Breath sounds: No stridor. No wheezing, rhonchi or rales.  Abdominal:     General: Abdomen is flat.     Palpations: There is no mass.     Tenderness: There is no abdominal tenderness. There is no guarding.     Hernia: No hernia is present.  Musculoskeletal:        General: No swelling.     Cervical back: Neck supple.     Right lower leg: No edema.     Left lower leg: No edema.  Skin:    General: Skin is warm.  Neurological:     General: No focal deficit present.     Mental Status: She is alert. Mental status is at baseline.  Psychiatric:        Mood and Affect: Mood normal.        Behavior: Behavior normal.     Lab Results  Component Value Date   WBC 3.1 (L) 11/26/2023   HGB 12.0 11/26/2023   HCT 35.8 (L) 11/26/2023   PLT 178.0 11/26/2023   GLUCOSE 150 (H) 11/26/2023   CHOL 137 11/26/2023   TRIG 36.0 11/26/2023   HDL 64.90 11/26/2023   LDLDIRECT 138.6 04/15/2013   LDLCALC 65 11/26/2023   ALT 25 11/26/2023   AST 19 11/26/2023   NA 136 11/26/2023   K 4.2 11/26/2023   CL 100 11/26/2023   CREATININE 0.91 11/26/2023   BUN 17 11/26/2023   CO2 30 11/26/2023   TSH 1.38 11/26/2023   HGBA1C 6.8 (H) 11/26/2023   MICROALBUR <0.7 02/20/2023    MM 3D SCREEN BREAST BILATERAL Result Date: 12/12/2022 CLINICAL DATA:  Screening. EXAM: DIGITAL SCREENING BILATERAL  MAMMOGRAM WITH TOMOSYNTHESIS AND CAD TECHNIQUE: Bilateral screening digital craniocaudal and mediolateral oblique mammograms were obtained. Bilateral screening digital breast tomosynthesis was performed. The images were evaluated with computer-aided detection. COMPARISON:  Previous exam(s). ACR Breast Density Category b: There are scattered areas of fibroglandular density. FINDINGS: There are no findings suspicious for malignancy. IMPRESSION: No mammographic evidence of malignancy. A result letter of this screening mammogram will be mailed directly to the patient. RECOMMENDATION: Screening mammogram in  one year. (Code:SM-B-01Y) BI-RADS CATEGORY  1: Negative. Electronically Signed   By: Annia Belt M.D.   On: 12/12/2022 06:14    Assessment & Plan:   Hyperlipidemia with target LDL less than 100- LDL goal achieved. Doing well on the statin. Will risk stratify with a coronary calcium score.  -     Lipid panel; Future -     TSH; Future -     Hepatic function panel; Future -     Rosuvastatin Calcium; Take 1 tablet (20 mg total) by mouth daily.  Dispense: 90 tablet; Refill: 0 -     CT CARDIAC SCORING (SELF PAY ONLY); Future  Essential hypertension, benign -     CBC with Differential/Platelet; Future -     Basic metabolic panel; Future -     EKG 12-Lead -     Aldosterone + renin activity w/ ratio; Future  Type II diabetes mellitus with manifestations (HCC) -     Basic metabolic panel; Future -     Hemoglobin A1c; Future -     HM Diabetes Foot Exam -     Tirzepatide; Inject 2.5 mg into the skin once a week.  Dispense: 2 mL; Refill: 0  Hypertension associated with diabetes (HCC)- Will try to ger better control of her BP. -     Basic metabolic panel; Future -     TSH; Future -     Urinalysis, Routine w reflex microscopic; Future -     Carvedilol; Take 1 tablet (6.25 mg total) by mouth 2 (two) times daily with a meal.  Dispense: 180 tablet; Refill: 0 -     Indapamide; Take 1 tablet (1.25 mg total)  by mouth daily.  Dispense: 90 tablet; Refill: 0 -     Olmesartan Medoxomil; Take 1 tablet (20 mg total) by mouth daily.  Dispense: 90 tablet; Refill: 0  Need for immunization against influenza -     Flu vaccine trivalent PF, 6mos and older(Flulaval,Afluria,Fluarix,Fluzone)  Cervical cancer screening -     Ambulatory referral to Gynecology  Screening mammogram for breast cancer -     Digital Screening Mammogram, Left and Right; Future     Onychomycosis due to dermatophyte -     Ambulatory referral to Podiatry  Unequal blood pressure in upper extremities -     CT CARDIAC SCORING (SELF PAY ONLY); Future  Encounter for general adult medical examination with abnormal findings- Exam completed, labs reviewed, vaccines reviewed and updated, cancer screenings addressed, pt ed material was given.   Thiamine deficiency neuropathy -     Vitamin B-1; Take 1 tablet (50 mg total) by mouth daily.  Dispense: 90 tablet; Refill: 1     Follow-up: Return in about 3 months (around 02/26/2024).  Sanda Linger, MD

## 2023-11-28 ENCOUNTER — Encounter: Payer: Self-pay | Admitting: Internal Medicine

## 2023-11-29 ENCOUNTER — Telehealth: Payer: Self-pay | Admitting: Pharmacy Technician

## 2023-11-29 ENCOUNTER — Other Ambulatory Visit (HOSPITAL_COMMUNITY): Payer: Self-pay

## 2023-11-29 NOTE — Telephone Encounter (Signed)
 Pharmacy Patient Advocate Encounter   Received notification from CoverMyMeds that prior authorization for [facility] 2.5MG  is required/requested.   Insurance verification completed.   The patient is insured through [facility] .   Per test claim: [id number] Submitted and pending

## 2023-11-29 NOTE — Telephone Encounter (Signed)
**Note De-identified  [place] Obfuscation** Please advise 

## 2023-11-29 NOTE — Telephone Encounter (Signed)
 Pharmacy Patient Advocate Encounter  Received notification from [facility] that Prior Authorization for [facility] 2.5MG  has been APPROVED from 11/29/23 to 11/28/24. Ran test claim, Copay is $25.00. This test claim was processed through [facility]- copay amounts may vary at other pharmacies due to pharmacy/plan contracts, or as the patient moves through the different stages of their insurance plan.   PA #/Case ID/Reference #: [id number]

## 2023-12-03 ENCOUNTER — Other Ambulatory Visit: Payer: Self-pay | Admitting: Internal Medicine

## 2023-12-03 DIAGNOSIS — E269 Hyperaldosteronism, unspecified: Secondary | ICD-10-CM

## 2023-12-03 LAB — ALDOSTERONE + RENIN ACTIVITY W/ RATIO
ALDO / PRA Ratio: 100 ratio — ABNORMAL HIGH (ref 0.9–28.9)
Aldosterone: 11 ng/dL
Renin Activity: 0.11 ng/mL/h — ABNORMAL LOW (ref 0.25–5.82)

## 2023-12-03 MED ORDER — SPIRONOLACTONE 25 MG PO TABS: 25.0000 mg | ORAL_TABLET | Freq: Every day | ORAL | 0 refills | Status: DC

## 2023-12-03 NOTE — Progress Notes (Signed)
 Please let know that I think she has hyperaldosteronism based on labs Ask her to STOP taking indapamide and START spironolactone

## 2023-12-11 ENCOUNTER — Ambulatory Visit: Admitting: Podiatry

## 2023-12-11 ENCOUNTER — Encounter: Payer: Self-pay | Admitting: Podiatry

## 2023-12-11 DIAGNOSIS — B351 Tinea unguium: Secondary | ICD-10-CM | POA: Diagnosis not present

## 2023-12-11 MED ORDER — TERBINAFINE HCL 250 MG PO TABS: 250.0000 mg | ORAL_TABLET | Freq: Every day | ORAL | 0 refills | Status: AC

## 2023-12-11 NOTE — Progress Notes (Signed)
   Chief Complaint  Patient presents with   Nail Problem    RM#6 discoloration of toe nails both feet for over 1 year .    Subjective: 63 y.o. female presenting today for evaluation of thick discoloration to the bilateral toenails.  Onset for over 1 year now.  PCP was concerned since she is diabetic and she presents for further treatment evaluation.  Last A1c 11/26/2023 6.8.  Past Medical History:  Diagnosis Date   Depression    Diabetes mellitus    GERD (gastroesophageal reflux disease)    Hyperlipidemia    Hypertension     Past Surgical History:  Procedure Laterality Date   COLONOSCOPY     TUBAL LIGATION      No Known Allergies   B/L toenails 12/11/2023  Objective: Physical Exam General: The patient is alert and oriented x3 in no acute distress.  Dermatology: Hyperkeratotic, discolored, thickened, onychodystrophy noted. Skin is warm, dry and supple bilateral lower extremities. Negative for open lesions or macerations.  Vascular: Palpable pedal pulses bilaterally. No edema or erythema noted. Capillary refill within normal limits.  Neurological: Grossly intact via light touch  Musculoskeletal Exam: No pedal deformity noted  Assessment: #1 Onychomycosis of toenails bilateral great toes  Plan of Care:  #1 Patient was evaluated. #2  Today we discussed different treatment options including oral, topical, and laser antifungal treatment modalities.  We discussed their efficacies and side effects.  Patient opts for oral antifungal treatment modality #3 prescription for Lamisil 250 mg #90 daily. Pt denies a history of liver pathology or symptoms.  Hepatic function 11/26/2023 WNL #4 return to clinic as needed   Felecia Shelling, DPM [facility]  Dr. Felecia Shelling, DPM    [address] [place], [place] [address]                Office [phone]  Fax [phone]

## 2023-12-12 ENCOUNTER — Ambulatory Visit
Admission: RE | Admit: 2023-12-12 | Discharge: 2023-12-12 | Disposition: A | Discharge status: Home / Self Care | Source: Ambulatory Visit | Attending: Internal Medicine | Admitting: Internal Medicine

## 2023-12-12 DIAGNOSIS — Z1231 Encounter for screening mammogram for malignant neoplasm of breast: Secondary | ICD-10-CM | POA: Diagnosis not present

## 2023-12-19 ENCOUNTER — Encounter: Payer: Self-pay | Admitting: Family Medicine

## 2023-12-19 ENCOUNTER — Ambulatory Visit: Admitting: Family Medicine

## 2023-12-19 ENCOUNTER — Ambulatory Visit: Payer: Self-pay

## 2023-12-19 VITALS — BP 150/100 | HR 74 | Temp 98.3°F | Ht 65.0 in | Wt 187.4 lb

## 2023-12-19 DIAGNOSIS — J069 Acute upper respiratory infection, unspecified: Secondary | ICD-10-CM | POA: Diagnosis not present

## 2023-12-19 DIAGNOSIS — R051 Acute cough: Secondary | ICD-10-CM | POA: Diagnosis not present

## 2023-12-19 DIAGNOSIS — B9689 Other specified bacterial agents as the cause of diseases classified elsewhere: Secondary | ICD-10-CM | POA: Diagnosis not present

## 2023-12-19 LAB — POC COVID19 BINAXNOW: SARS Coronavirus 2 Ag: NEGATIVE

## 2023-12-19 LAB — POCT INFLUENZA A/B
Influenza A, POC: NEGATIVE
Influenza B, POC: NEGATIVE

## 2023-12-19 MED ORDER — AZITHROMYCIN 250 MG PO TABS: ORAL_TABLET | ORAL | 0 refills | Status: AC

## 2023-12-19 MED ORDER — HYDROCODONE BIT-HOMATROP MBR 5-1.5 MG/5ML PO SOLN: 5.0000 mL | Freq: Three times a day (TID) | ORAL | 0 refills | Status: DC | PRN

## 2023-12-19 NOTE — Progress Notes (Signed)
 Acute Office Visit  Subjective:     Patient ID: Virginia Jimenez, female    DOB: Feb 03, 1961, 63 y.o.   MRN: [id number]  Chief Complaint  Patient presents with   Cough    Since 3-4 days, cold symptom with runny days for 4 to a week. Discharged green mucus. Taking DayQuil.     Cough   Patient is in today for evaluation of cough, body aches, wheezing, intermittent fever, purulent nasal discharge, headache for the last week. Has tried DayQuil and NyQuil with little relief. Reports the last sick contacts about 2 weeks ago.  They were also sick with respiratory symptoms. Denies abdominal pain, nausea, vomiting, diarrhea, rash, other symptoms.  Medical hx as outlined below.  Review of Systems  Respiratory:  Positive for cough.    Per HPI      Objective:    BP (!) 150/100 (BP Location: Left Arm, Patient Position: Sitting)   Pulse 74   Temp 98.3 F (36.8 C) (Temporal)   Ht 5\' 5"  (1.651 m)   Wt 187 lb 6.4 oz (85 kg)   LMP 06/01/2012   SpO2 93%   BMI 31.18 kg/m    Physical Exam Vitals and nursing note reviewed.  Constitutional:      General: She is not in acute distress.    Comments: Appears fatigued  HENT:     Head: Normocephalic and atraumatic.     Right Ear: External ear normal.     Left Ear: External ear normal.     Nose: Congestion present.     Right Sinus: Maxillary sinus tenderness present.     Left Sinus: Maxillary sinus tenderness present.     Mouth/Throat:     Mouth: Mucous membranes are moist.     Pharynx: No oropharyngeal exudate or posterior oropharyngeal erythema.     Comments: Oropharyngeal cobblestoning   Eyes:     Extraocular Movements: Extraocular movements intact.  Cardiovascular:     Rate and Rhythm: Normal rate and regular rhythm.     Heart sounds: Normal heart sounds.  Pulmonary:     Effort: Pulmonary effort is normal. No respiratory distress.     Breath sounds: No wheezing, rhonchi or rales.     Comments: Moderate  cough Musculoskeletal:     Cervical back: Normal range of motion and neck supple.  Lymphadenopathy:     Cervical: Cervical adenopathy present.  Skin:    General: Skin is warm and dry.  Neurological:     General: No focal deficit present.     Mental Status: She is alert and oriented to person, place, and time.     Results for orders placed or performed in visit on 12/19/23  POCT Influenza A/B  Result Value Ref Range   Influenza A, POC Negative Negative   Influenza B, POC Negative Negative  POC COVID-19 BinaxNow  Result Value Ref Range   SARS Coronavirus 2 Ag Negative Negative        Assessment & Plan:   Bacterial URI -     Azithromycin; Take 2 tablets on day 1, then 1 tablet daily on days 2 through 5  Dispense: 6 tablet; Refill: 0  Acute cough -     POCT Influenza A/B -     POC COVID-19 BinaxNow -     HYDROcodone Bit-Homatrop MBr; Take 5 mLs by mouth every 8 (eight) hours as needed for cough.  Dispense: 120 mL; Refill: 0  Negative viral testing today. Will treat with antibiotic given  length of symptoms Sedation precautions given for cough syrup  Meds ordered this encounter  Medications   azithromycin (ZITHROMAX) 250 MG tablet    Sig: Take 2 tablets on day 1, then 1 tablet daily on days 2 through 5    Dispense:  6 tablet    Refill:  0   HYDROcodone bit-homatropine (HYCODAN) 5-1.5 MG/5ML syrup    Sig: Take 5 mLs by mouth every 8 (eight) hours as needed for cough.    Dispense:  120 mL    Refill:  0    Return if symptoms worsen or fail to improve.  Sherald Barge, FNP

## 2023-12-19 NOTE — Patient Instructions (Signed)
 Viral testing was negative today.  I have sent in azithromycin for you to take.  Take 2 tablets today, then 1 tablet daily for the next 4 days.  I have sent in hydrocodone cough syrup for you to take 5 mL once daily in the evening as needed for cough.  This medication may make you sleepy.  Do not drive or operate heavy machinery while taking this medication.  Follow-up with me for new or worsening symptoms.

## 2023-12-19 NOTE — Telephone Encounter (Signed)
  Chief Complaint: cough reen phlegm Symptoms: wheezing, runny nose, body aches Frequency: 1 week Pertinent Negatives: Patient denies SOB Disposition: [] ED /[] Urgent Care (no appt availability in office) / [x] Appointment(In office/virtual)/ []  [facility]/ [] Home Care/ [] Refused Recommended Disposition /[] [facility]/ []  Follow-up with PCP Additional Notes:  Copied from CRM #[id number]. Topic: Clinical - Red Word Triage >> Dec 19, 2023  8:48 AM Martinique E wrote: [facility] that prompted transfer to Nurse Triage: Patient experiencing chest rattling, wheezing, coughing, body aches, runny nose, and a fever (patient did not have exact temp). Symptoms have been going on for the past week. Reason for Disposition . Wheezing is present  Answer Assessment - Initial Assessment Questions 1. ONSET: "When did the cough begin?"      1 week  2. SEVERITY: "How bad is the cough today?"      *No Answer* 3. SPUTUM: "Describe the color of your sputum" (none, dry cough; clear, white, yellow, green)     Green yellow  4. HEMOPTYSIS: "Are you coughing up any blood?" If so ask: "How much?" (flecks, streaks, tablespoons, etc.)     no 5. DIFFICULTY BREATHING: "Are you having difficulty breathing?" If Yes, ask: "How bad is it?" (e.g., mild, moderate, severe)    - MILD: No SOB at rest, mild SOB with walking, speaks normally in sentences, can lie down, no retractions, pulse < 100.    - MODERATE: SOB at rest, SOB with minimal exertion and prefers to sit, cannot lie down flat, speaks in phrases, mild retractions, audible wheezing, pulse 100-120.    - SEVERE: Very SOB at rest, speaks in single words, struggling to breathe, sitting hunched forward, retractions, pulse > 120      Wheezing  6. FEVER: "Do you have a fever?" If Yes, ask: "What is your temperature, how was it measured, and when did it start?"     chills  8. LUNG HISTORY: "Do you have any history of lung disease?"  (e.g., pulmonary embolus,  asthma, emphysema)     no 10. OTHER SYMPTOMS: "Do you have any other symptoms?" (e.g., runny nose, wheezing, chest pain)       Coughing , body aches, runny nose, subj fever  Protocols used: Cough - Acute Productive-A-AH

## 2023-12-24 ENCOUNTER — Other Ambulatory Visit: Payer: Self-pay | Admitting: Internal Medicine

## 2023-12-24 ENCOUNTER — Telehealth: Payer: Self-pay | Admitting: Internal Medicine

## 2023-12-24 DIAGNOSIS — E118 Type 2 diabetes mellitus with unspecified complications: Secondary | ICD-10-CM

## 2023-12-24 NOTE — Telephone Encounter (Signed)
 Copied from CRM [id number]. Topic: Clinical - Medication Refill >> Dec 24, 2023  1:23 PM Almira Coaster wrote: Most Recent Primary Care Visit:  Provider: Sherald Barge  Department: [facility]  Visit Type: ACUTE  Date: 12/19/2023  Medication: tirzepatide [facility]) 2.5 MG/0.5ML Pen, patient would like to increase the dosage   Has the patient contacted their pharmacy? Yes (Agent: If no, request that the patient contact the pharmacy for the refill. If patient does not wish to contact the pharmacy document the reason why and proceed with request.) (Agent: If yes, when and what did the pharmacy advise?)  Is this the correct pharmacy for this prescription? Yes If no, delete pharmacy and type the correct one.  This is the patient's preferred pharmacy:  [facility] [id number] - [place], [place] - [address] [place] [address] Phone: [phone] Fax: [phone]    Has the prescription been filled recently? No  Is the patient out of the medication? Yes  Has the patient been seen for an appointment in the last year OR does the patient have an upcoming appointment? Yes  Can we respond through MyChart? Yes  Agent: Please be advised that Rx refills may take up to 3 business days. We ask that you follow-up with your pharmacy.

## 2023-12-24 NOTE — Telephone Encounter (Signed)
 Copied from CRM [id number]. Topic: General - Call Back - No Documentation >> Dec 24, 2023  1:16 PM Arm[place] J wrote: Reason for CRM: Patient received a call from Boy[facility] regarding a medication issue. Patient is calling back to figure out what the medication issue is.  System issues took place and dropped call. I tried calling patient back but I was sent to voicemail.

## 2023-12-25 ENCOUNTER — Other Ambulatory Visit: Payer: Self-pay | Admitting: Internal Medicine

## 2023-12-25 DIAGNOSIS — E118 Type 2 diabetes mellitus with unspecified complications: Secondary | ICD-10-CM

## 2023-12-25 MED ORDER — TIRZEPATIDE 5 MG/0.5ML ~~LOC~~ SOAJ
5.0000 mg | SUBCUTANEOUS | 0 refills | Status: DC
Start: 2023-12-25 — End: 2024-01-17

## 2023-12-27 NOTE — Telephone Encounter (Signed)
 Unable to reach patient. LMTRC

## 2024-01-02 NOTE — Telephone Encounter (Signed)
 Unable to reach patient. Lmtrc. Closing this telephone encounter as it has been 10 days.

## 2024-01-13 ENCOUNTER — Other Ambulatory Visit: Payer: Self-pay | Admitting: Internal Medicine

## 2024-01-13 DIAGNOSIS — E118 Type 2 diabetes mellitus with unspecified complications: Secondary | ICD-10-CM

## 2024-01-15 ENCOUNTER — Telehealth: Payer: Self-pay

## 2024-01-15 NOTE — Telephone Encounter (Signed)
 Copied from CRM [id number]. Topic: Clinical - Prescription Issue >> Jan 14, 2024  4:51 PM Jethro Morrison wrote: Reason for CRM: PT IS RQUESTING DOSAGE 7.5 OF THE  MOUNJARO 5 MG/0.5ML Pen. SHE STATED THE PHARMACY STATED THE PROVIDER DENIED IT.

## 2024-01-16 ENCOUNTER — Ambulatory Visit: Admitting: Family Medicine

## 2024-01-17 ENCOUNTER — Other Ambulatory Visit: Payer: Self-pay | Admitting: Internal Medicine

## 2024-01-17 DIAGNOSIS — E118 Type 2 diabetes mellitus with unspecified complications: Secondary | ICD-10-CM

## 2024-01-17 MED ORDER — TIRZEPATIDE 7.5 MG/0.5ML ~~LOC~~ SOAJ: 7.5000 mg | SUBCUTANEOUS | 0 refills | Status: DC

## 2024-01-17 NOTE — Telephone Encounter (Signed)
**Note De-identified  [place] Obfuscation** Please advise 

## 2024-01-17 NOTE — Telephone Encounter (Signed)
 Medication has been refilled and patient has been made aware.

## 2024-01-20 NOTE — Progress Notes (Unsigned)
   Joanna Muck, PhD, LAT, ATC acting as a scribe for Garlan Juniper, MD.  Virginia Jimenez is a 63 y.o. female who presents to [facility] today for exacerbation of her bilat knee pain. Pt was last seen by Dr. Alease Hunter on 01/23/22 and was given bilat knee steroid injections and was advised to cont quad strengthening and weight loss. Completed Orthovisc series, 3/3, bilat, on 10/23/21.  Today, pt reports ***  Dx imaging: 01/31/21 R & L knee XR  Pertinent review of systems: ***  Relevant historical information: ***   Exam:  LMP 06/01/2012  General: Well Developed, well nourished, and in no acute distress.   MSK: ***    Lab and Radiology Results No results found for this or any previous visit (from the past 72 hours). No results found.     Assessment and Plan: 63 y.o. female with ***   PDMP not reviewed this encounter. No orders of the defined types were placed in this encounter.  No orders of the defined types were placed in this encounter.    Discussed warning signs or symptoms. Please see discharge instructions. Patient expresses understanding.   ***

## 2024-01-21 ENCOUNTER — Ambulatory Visit: Admitting: Family Medicine

## 2024-01-21 ENCOUNTER — Other Ambulatory Visit: Payer: Self-pay

## 2024-01-21 ENCOUNTER — Encounter: Payer: Self-pay | Admitting: Family Medicine

## 2024-01-21 VITALS — BP 132/82 | HR 76 | Ht 65.0 in | Wt 190.0 lb

## 2024-01-21 DIAGNOSIS — M25562 Pain in left knee: Secondary | ICD-10-CM | POA: Diagnosis not present

## 2024-01-21 DIAGNOSIS — G8929 Other chronic pain: Secondary | ICD-10-CM | POA: Diagnosis not present

## 2024-01-21 DIAGNOSIS — M25561 Pain in right knee: Secondary | ICD-10-CM | POA: Diagnosis not present

## 2024-01-21 DIAGNOSIS — M170 Bilateral primary osteoarthritis of knee: Secondary | ICD-10-CM | POA: Diagnosis not present

## 2024-01-21 NOTE — Patient Instructions (Addendum)
 Thank you for coming in today.   You received an injection today. Seek immediate medical attention if the joint becomes red, extremely painful, or is oozing fluid.   Let us  know if you would like for us  to check coverage for gel shots.   See you back as needed.

## 2024-02-13 ENCOUNTER — Telehealth: Payer: Self-pay | Admitting: Internal Medicine

## 2024-02-13 NOTE — Telephone Encounter (Unsigned)
 Copied from CRM [id number]. Topic: Clinical - Medication Refill >> Feb 13, 2024  4:42 PM Tiffany S wrote: Medication: tirzepatide (MOUNJARO) 7.5 MG/0.5ML Pen [[id number]]  Increased dose 10mg   Has the patient contacted their pharmacy? Yes (Agent: If no, request that the patient contact the pharmacy for the refill. If patient does not wish to contact the pharmacy document the reason why and proceed with request.) (Agent: If yes, when and what did the pharmacy advise?)  This is the patient's preferred pharmacy:  [facility] [id number] [place], [place] - [address]  Phone: [phone] Fax: [phone]   Is this the correct pharmacy for this prescription? Yes If no, delete pharmacy and type the correct one.   Has the prescription been filled recently? Yes  Is the patient out of the medication? Yes  Has the patient been seen for an appointment in the last year OR does the patient have an upcoming appointment? Yes  Can we respond through MyChart? Yes  Agent: Please be advised that Rx refills may take up to 3 business days. We ask that you follow-up with your pharmacy.

## 2024-02-13 NOTE — Telephone Encounter (Signed)
 Patient asking for refill of Mounjaro but increase to 10 mg dosing.  Copied from CRM [id number]. Topic: Clinical - Medication Refill >> Feb 13, 2024  4:42 PM Tiffany S wrote: Medication: tirzepatide (MOUNJARO) 7.5 MG/0.5ML Pen [[id number]]  Increased dose 10mg   Has the patient contacted their pharmacy? Yes (Agent: If no, request that the patient contact the pharmacy for the refill. If patient does not wish to contact the pharmacy document the reason why and proceed with request.) (Agent: If yes, when and what did the pharmacy advise?)  This is the patient's preferred pharmacy:  [facility] [id number] [place], [place] - [address]  Phone: [phone] Fax: [phone]   Is this the correct pharmacy for this prescription? Yes If no, delete pharmacy and type the correct one.   Has the prescription been filled recently? Yes  Is the patient out of the medication? Yes  Has the patient been seen for an appointment in the last year OR does the patient have an upcoming appointment? Yes  Can we respond through MyChart? Yes  Agent: Please be advised that Rx refills may take up to 3 business days. We ask that you follow-up with your pharmacy.

## 2024-02-14 ENCOUNTER — Other Ambulatory Visit: Payer: Self-pay | Admitting: Internal Medicine

## 2024-02-14 DIAGNOSIS — E118 Type 2 diabetes mellitus with unspecified complications: Secondary | ICD-10-CM

## 2024-02-14 MED ORDER — TIRZEPATIDE 10 MG/0.5ML ~~LOC~~ SOAJ
10.0000 mg | SUBCUTANEOUS | 0 refills | Status: DC
Start: 2024-02-14 — End: 2024-03-16

## 2024-02-14 NOTE — Telephone Encounter (Signed)
**Note De-identified  [place] Obfuscation** Please advise 

## 2024-02-17 ENCOUNTER — Telehealth: Payer: Self-pay

## 2024-02-17 ENCOUNTER — Other Ambulatory Visit (HOSPITAL_COMMUNITY): Payer: Self-pay

## 2024-02-17 NOTE — Telephone Encounter (Signed)
**Note De-identified  [place] Obfuscation** Please advise 

## 2024-02-17 NOTE — Telephone Encounter (Signed)
 Pharmacy Patient Advocate Encounter   Received notification from CoverMyMeds that prior authorization for Mounjaro  10 is required/requested.   Insurance verification completed.   The patient is insured through [employer] .   Per test claim: Refill too soon. PA is not needed at this time. Medication was filled 02/15/24. Next eligible fill date is 04/18/24.Aaron Aas Spoke with [facility], medication is ready and waiting.

## 2024-02-18 ENCOUNTER — Other Ambulatory Visit (HOSPITAL_COMMUNITY): Payer: Self-pay

## 2024-02-25 ENCOUNTER — Other Ambulatory Visit: Payer: Self-pay | Admitting: Internal Medicine

## 2024-02-25 DIAGNOSIS — I152 Hypertension secondary to endocrine disorders: Secondary | ICD-10-CM

## 2024-02-26 ENCOUNTER — Other Ambulatory Visit: Payer: Self-pay | Admitting: Internal Medicine

## 2024-02-26 DIAGNOSIS — I152 Hypertension secondary to endocrine disorders: Secondary | ICD-10-CM

## 2024-02-26 MED ORDER — OLMESARTAN MEDOXOMIL 20 MG PO TABS: 20.0000 mg | ORAL_TABLET | Freq: Every day | ORAL | 0 refills | Status: AC

## 2024-03-03 ENCOUNTER — Other Ambulatory Visit: Payer: Self-pay | Admitting: Internal Medicine

## 2024-03-03 DIAGNOSIS — E269 Hyperaldosteronism, unspecified: Secondary | ICD-10-CM

## 2024-03-15 ENCOUNTER — Other Ambulatory Visit: Payer: Self-pay | Admitting: Internal Medicine

## 2024-03-15 DIAGNOSIS — E118 Type 2 diabetes mellitus with unspecified complications: Secondary | ICD-10-CM

## 2024-03-16 ENCOUNTER — Other Ambulatory Visit: Payer: Self-pay | Admitting: Internal Medicine

## 2024-03-16 DIAGNOSIS — E118 Type 2 diabetes mellitus with unspecified complications: Secondary | ICD-10-CM

## 2024-03-16 NOTE — Telephone Encounter (Unsigned)
 Copied from CRM [id number]. Topic: Clinical - Medication Refill >> Mar 16, 2024  1:55 PM Thersia C wrote: Medication: tirzepatide  (MOUNJARO ) 15 MG  Has the patient contacted their pharmacy? Yes (Agent: If no, request that the patient contact the pharmacy for the refill. If patient does not wish to contact the pharmacy document the reason why and proceed with request.) (Agent: If yes, when and what did the pharmacy advise?)  This is the patient's preferred pharmacy:  [facility] [id number] - [place], [place] - [address] [place] [address] Phone: [phone] Fax: [phone]   Is this the correct pharmacy for this prescription? Yes If no, delete pharmacy and type the correct one.   Has the prescription been filled recently? No  Is the patient out of the medication? Yes  Has the patient been seen for an appointment in the last year OR does the patient have an upcoming appointment? Yes  Can we respond through MyChart? Yes  Agent: Please be advised that Rx refills may take up to 3 business days. We ask that you follow-up with your pharmacy.

## 2024-03-19 ENCOUNTER — Ambulatory Visit: Payer: Self-pay | Admitting: Internal Medicine

## 2024-03-19 ENCOUNTER — Encounter: Payer: Self-pay | Admitting: Internal Medicine

## 2024-03-19 ENCOUNTER — Other Ambulatory Visit (INDEPENDENT_AMBULATORY_CARE_PROVIDER_SITE_OTHER)

## 2024-03-19 ENCOUNTER — Ambulatory Visit: Admitting: Internal Medicine

## 2024-03-19 VITALS — BP 138/72 | HR 74 | Temp 98.1°F | Resp 16 | Ht 65.0 in | Wt 183.2 lb

## 2024-03-19 DIAGNOSIS — Z7985 Long-term (current) use of injectable non-insulin antidiabetic drugs: Secondary | ICD-10-CM

## 2024-03-19 DIAGNOSIS — I152 Hypertension secondary to endocrine disorders: Secondary | ICD-10-CM | POA: Diagnosis not present

## 2024-03-19 DIAGNOSIS — E5111 Dry beriberi: Secondary | ICD-10-CM

## 2024-03-19 DIAGNOSIS — E1159 Type 2 diabetes mellitus with other circulatory complications: Secondary | ICD-10-CM

## 2024-03-19 DIAGNOSIS — K219 Gastro-esophageal reflux disease without esophagitis: Secondary | ICD-10-CM

## 2024-03-19 DIAGNOSIS — E118 Type 2 diabetes mellitus with unspecified complications: Secondary | ICD-10-CM

## 2024-03-19 DIAGNOSIS — Z124 Encounter for screening for malignant neoplasm of cervix: Secondary | ICD-10-CM

## 2024-03-19 LAB — CBC WITH DIFFERENTIAL/PLATELET
Basophils Absolute: 0 K/uL (ref 0.0–0.1)
Basophils Relative: 0.7 % (ref 0.0–3.0)
Eosinophils Absolute: 0 K/uL (ref 0.0–0.7)
Eosinophils Relative: 0.8 % (ref 0.0–5.0)
HCT: 36.7 % (ref 36.0–46.0)
Hemoglobin: 12.5 g/dL (ref 12.0–15.0)
Lymphocytes Relative: 23.6 % (ref 12.0–46.0)
Lymphs Abs: 0.7 K/uL (ref 0.7–4.0)
MCHC: 34 g/dL (ref 30.0–36.0)
MCV: 94.1 fl (ref 78.0–100.0)
Monocytes Absolute: 0.4 K/uL (ref 0.1–1.0)
Monocytes Relative: 13.2 % — ABNORMAL HIGH (ref 3.0–12.0)
Neutro Abs: 1.9 K/uL (ref 1.4–7.7)
Neutrophils Relative %: 61.7 % (ref 43.0–77.0)
Platelets: 177 K/uL (ref 150.0–400.0)
RBC: 3.9 Mil/uL (ref 3.87–5.11)
RDW: 12.7 % (ref 11.5–15.5)
WBC: 3.1 K/uL — ABNORMAL LOW (ref 4.0–10.5)

## 2024-03-19 LAB — MICROALBUMIN / CREATININE URINE RATIO
Creatinine,U: 103.2 mg/dL
Microalb Creat Ratio: UNDETERMINED mg/g (ref 0.0–30.0)
Microalb, Ur: <0.7 mg/dL

## 2024-03-19 LAB — BASIC METABOLIC PANEL WITH GFR
BUN: 22 mg/dL (ref 6–23)
CO2: 29 meq/L (ref 19–32)
Calcium: 9.8 mg/dL (ref 8.4–10.5)
Chloride: 102 meq/L (ref 96–112)
Creatinine, Ser: 0.98 mg/dL (ref 0.40–1.20)
GFR: 61.87 mL/min (ref >60.00)
Glucose, Bld: 125 mg/dL — ABNORMAL HIGH (ref 70–99)
Potassium: 4.3 meq/L (ref 3.5–5.1)
Sodium: 137 meq/L (ref 135–145)

## 2024-03-19 LAB — URINALYSIS, ROUTINE W REFLEX MICROSCOPIC
Bilirubin Urine: NEGATIVE
Hgb urine dipstick: NEGATIVE
Ketones, ur: NEGATIVE
Leukocytes,Ua: NEGATIVE
Nitrite: NEGATIVE
Specific Gravity, Urine: 1.01 (ref 1.000–1.030)
Total Protein, Urine: NEGATIVE
Urine Glucose: NEGATIVE
Urobilinogen, UA: 0.2 (ref 0.0–1.0)
pH: 6 (ref 5.0–8.0)

## 2024-03-19 LAB — HEMOGLOBIN A1C: Hgb A1c MFr Bld: 6.7 % — ABNORMAL HIGH (ref 4.6–6.5)

## 2024-03-19 MED ORDER — TIRZEPATIDE 12.5 MG/0.5ML ~~LOC~~ SOAJ
12.5000 mg | SUBCUTANEOUS | 0 refills | Status: DC
Start: 2024-03-19 — End: 2024-07-13

## 2024-03-19 NOTE — Progress Notes (Signed)
 Subjective:  Patient ID: Virginia Jimenez, female    DOB: September 19, 1960  Age: 63 y.o. MRN: [id number]  CC: Hypertension, Hyperlipidemia, and Diabetes   HPI [name] presents for f/up -----  Discussed the use of AI scribe software for clinical note transcription with the patient, who gave verbal consent to proceed.  History of Present Illness   Virginia Jimenez is a 63 year old female who presents for a routine visit.  She remains active despite knee issues. No chest pain, shortness of breath, dizziness, or lightheadedness during physical activity.  She continues to take terbinafine  for toenail fungus, as the condition has not yet resolved and she has not completed the medication course.  She is on Mounjaro  for weight management and has experienced a weight loss of five to six pounds since the last visit. No side effects from the medication. She wants to increase the dosage.  She has an eye exam scheduled for next month and requires a referral for a Pap smear, as it has been some time since her last one.       Outpatient Medications Prior to Visit  Medication Sig Dispense Refill   carvedilol  (COREG ) 6.25 MG tablet Take 1 tablet (6.25 mg total) by mouth 2 (two) times daily with a meal. 180 tablet 0   olmesartan  (BENICAR ) 20 MG tablet Take 1 tablet (20 mg total) by mouth daily. 90 tablet 0   rosuvastatin  (CRESTOR ) 20 MG tablet Take 1 tablet (20 mg total) by mouth daily. 90 tablet 0   spironolactone  (ALDACTONE ) 25 MG tablet TAKE 1 TABLET BY MOUTH DAILY 90 tablet 0   terbinafine  (LAMISIL ) 250 MG tablet Take 1 tablet (250 mg total) by mouth daily. 90 tablet 0   thiamine  (VITAMIN B-1) 50 MG tablet Take 1 tablet (50 mg total) by mouth daily. 90 tablet 1   traMADol  (ULTRAM ) 50 MG tablet Take 1 tablet (50 mg total) by mouth every 6 (six) hours as needed. 75 tablet 3   tirzepatide  (MOUNJARO ) 10 MG/0.5ML Pen INJECT 10 MG UNDER THE SKIN ONCE WEEKLY 2 mL 2   No  facility-administered medications prior to visit.    ROS Review of Systems  Constitutional:  Negative for appetite change, chills, diaphoresis, fatigue and fever.  HENT: Negative.    Respiratory: Negative.  Negative for cough, chest tightness, shortness of breath and wheezing.   Cardiovascular:  Negative for chest pain, palpitations and leg swelling.  Gastrointestinal:  Negative for abdominal pain, constipation, diarrhea, nausea and vomiting.  Genitourinary:  Negative for difficulty urinating and dysuria.  Musculoskeletal: Negative.  Negative for arthralgias and joint swelling.  Skin: Negative.   Neurological:  Negative for dizziness and weakness.  Hematological:  Negative for adenopathy. Does not bruise/bleed easily.  Psychiatric/Behavioral: Negative.      Objective:  BP 138/72 (BP Location: Left Arm, Patient Position: Sitting)   Pulse 74   Temp 98.1 F (36.7 C) (Oral)   Resp 16   Ht 5' 5 (1.651 m)   Wt 183 lb 3.2 oz (83.1 kg)   LMP 06/01/2012   SpO2 99%   BMI 30.49 kg/m   BP Readings from Last 3 Encounters:  03/19/24 138/72  01/21/24 132/82  12/19/23 (!) 150/100    Wt Readings from Last 3 Encounters:  03/19/24 183 lb 3.2 oz (83.1 kg)  01/21/24 190 lb (86.2 kg)  12/19/23 187 lb 6.4 oz (85 kg)    Physical Exam Vitals reviewed.  Constitutional:  Appearance: Normal appearance.  HENT:     Nose: Nose normal.     Mouth/Throat:     Mouth: Mucous membranes are moist.  Eyes:     General: No scleral icterus.    Conjunctiva/sclera: Conjunctivae normal.  Cardiovascular:     Rate and Rhythm: Normal rate and regular rhythm.     Heart sounds: No murmur heard.    No friction rub. No gallop.  Pulmonary:     Effort: Pulmonary effort is normal.     Breath sounds: No stridor. No wheezing, rhonchi or rales.  Abdominal:     General: Abdomen is flat.     Palpations: There is no mass.     Tenderness: There is no abdominal tenderness. There is no guarding.     Hernia: No  hernia is present.  Musculoskeletal:        General: Normal range of motion.     Cervical back: Neck supple.     Right lower leg: No edema.     Left lower leg: No edema.  Lymphadenopathy:     Cervical: No cervical adenopathy.  Skin:    General: Skin is warm and dry.  Neurological:     General: No focal deficit present.     Mental Status: She is alert. Mental status is at baseline.  Psychiatric:        Mood and Affect: Mood normal.        Behavior: Behavior normal.     Lab Results  Component Value Date   WBC 3.1 (L) 03/19/2024   HGB 12.5 03/19/2024   HCT 36.7 03/19/2024   PLT 177.0 03/19/2024   GLUCOSE 125 (H) 03/19/2024   CHOL 137 11/26/2023   TRIG 36.0 11/26/2023   HDL 64.90 11/26/2023   LDLDIRECT 138.6 04/15/2013   LDLCALC 65 11/26/2023   ALT 25 11/26/2023   AST 19 11/26/2023   NA 137 03/19/2024   K 4.3 03/19/2024   CL 102 03/19/2024   CREATININE 0.98 03/19/2024   BUN 22 03/19/2024   CO2 29 03/19/2024   TSH 1.38 11/26/2023   HGBA1C 6.7 (H) 03/19/2024   MICROALBUR <0.7 03/19/2024    MM 3D SCREENING MAMMOGRAM BILATERAL BREAST Result Date: 12/13/2023 CLINICAL DATA:  Screening. EXAM: DIGITAL SCREENING BILATERAL MAMMOGRAM WITH TOMOSYNTHESIS AND CAD TECHNIQUE: Bilateral screening digital craniocaudal and mediolateral oblique mammograms were obtained. Bilateral screening digital breast tomosynthesis was performed. The images were evaluated with computer-aided detection. COMPARISON:  Previous exam(s). ACR Breast Density Category b: There are scattered areas of fibroglandular density. FINDINGS: There are no findings suspicious for malignancy. IMPRESSION: No mammographic evidence of malignancy. A result letter of this screening mammogram will be mailed directly to the patient. RECOMMENDATION: Screening mammogram in one year. (Code:SM-B-01Y) BI-RADS CATEGORY  1: Negative. Electronically Signed   By: Craig Farr M.D.   On: 12/13/2023 12:20    Assessment & Plan:   Type II  diabetes mellitus with manifestations (HCC)- Her blood sugar is well controlled. -     Basic metabolic panel with GFR; Future -     Hemoglobin A1c; Future -     Urinalysis, Routine w reflex microscopic; Future -     Microalbumin / creatinine urine ratio; Future -     Tirzepatide ; Inject 12.5 mg into the skin once a week.  Dispense: 6 mL; Refill: 0  Gastroesophageal reflux disease without esophagitis -     CBC with Differential/Platelet; Future  Thiamine  deficiency neuropathy -     CBC with Differential/Platelet; Future  Hypertension associated with diabetes (HCC)- BP is well controlled. -     Basic metabolic panel with GFR; Future -     Urinalysis, Routine w reflex microscopic; Future  Cervical cancer screening -     Ambulatory referral to Gynecology     Follow-up: Return in about 6 months (around 09/19/2024).  Debby Molt, MD

## 2024-03-19 NOTE — Patient Instructions (Signed)

## 2024-04-09 ENCOUNTER — Encounter: Payer: Self-pay | Admitting: Internal Medicine

## 2024-04-09 DIAGNOSIS — H2513 Age-related nuclear cataract, bilateral: Secondary | ICD-10-CM | POA: Diagnosis not present

## 2024-04-09 DIAGNOSIS — E113292 Type 2 diabetes mellitus with mild nonproliferative diabetic retinopathy without macular edema, left eye: Secondary | ICD-10-CM | POA: Diagnosis not present

## 2024-04-09 DIAGNOSIS — H5213 Myopia, bilateral: Secondary | ICD-10-CM | POA: Diagnosis not present

## 2024-04-09 LAB — HM DIABETES EYE EXAM

## 2024-04-14 ENCOUNTER — Other Ambulatory Visit: Payer: Self-pay | Admitting: Internal Medicine

## 2024-04-14 DIAGNOSIS — E785 Hyperlipidemia, unspecified: Secondary | ICD-10-CM

## 2024-04-16 ENCOUNTER — Other Ambulatory Visit: Payer: Self-pay | Admitting: Internal Medicine

## 2024-04-16 ENCOUNTER — Telehealth: Payer: Self-pay | Admitting: Family Medicine

## 2024-04-16 DIAGNOSIS — E1159 Type 2 diabetes mellitus with other circulatory complications: Secondary | ICD-10-CM

## 2024-04-16 NOTE — Telephone Encounter (Signed)
 Pt called--wants to start gel for bil knees.  Last done 2023, confirmed [employer] on file.  Dr. Jolayne to run this pt for gel auth for bil knees?

## 2024-04-16 NOTE — Telephone Encounter (Signed)
 Looks like last visit Dr. Joane stated if steroid didn't help or doesn't last long we can go ahead with gel. Ran patient for Orthovisc Bilateral knee  Case ID [id number]

## 2024-04-16 NOTE — Telephone Encounter (Signed)
 Okay to run gel shots but will need to do it for Hart who prefers a single dose gel shot.  Alternatively patient could wait till I come back from leave.

## 2024-04-20 NOTE — Telephone Encounter (Signed)
 Last OV 03/19/24 Next OV not scheduled  Last refill 11/26/23 Qty #180/0

## 2024-04-21 NOTE — Telephone Encounter (Signed)
 It is fine, Dr. Leonce has already been doing some triple dose injections as patient preferred

## 2024-07-13 ENCOUNTER — Other Ambulatory Visit: Payer: Self-pay | Admitting: Internal Medicine

## 2024-07-13 DIAGNOSIS — E118 Type 2 diabetes mellitus with unspecified complications: Secondary | ICD-10-CM

## 2024-09-08 ENCOUNTER — Other Ambulatory Visit: Payer: Self-pay | Admitting: Internal Medicine

## 2024-09-08 DIAGNOSIS — E118 Type 2 diabetes mellitus with unspecified complications: Secondary | ICD-10-CM

## 2024-09-08 NOTE — Progress Notes (Signed)
 "        I, Leotis Batter, CMA acting as a scribe for Artist Lloyd, MD.  Virginia Jimenez is a 63 y.o. female who presents to [facility] today for exacerbation of her bilat knee pain. Pt was last seen by Dr. Lloyd on 01/21/24 and was given bilat knee steroid injections.  Today, pt reports good response to last steroid injection. Notes sx exacerbation over the past couple of months. Denies visible swelling. Minimal relief with Tylenol, tries to avoid NSAIDs.   Dx imaging: 01/31/21 R & L knee XR   Pertinent review of systems: No fevers or chills  Relevant historical information: Hypertension and diabetes.  Knee DJD.  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   Exam:  BP 136/80   Pulse 73   Ht 5' 5 (1.651 m)   Wt 178 lb (80.7 kg)   LMP 06/01/2012   SpO2 99%   BMI 29.62 kg/m  General: Well Developed, well nourished, and in no acute distress.   MSK: Knees bilaterally mild effusion normal-appearing otherwise normal motion.    Lab and Radiology Results  Procedure: Real-time Ultrasound Guided Injection of right knee joint superior lateral patella space Device: Philips Affiniti 50G/GE Logiq Images permanently stored and available for review in PACS Verbal informed consent obtained.  Discussed risks and benefits of procedure. Warned about infection, bleeding, hyperglycemia damage to structures among others. Patient expresses understanding and agreement Time-out conducted.   Noted no overlying erythema, induration, or other signs of local infection.   Skin prepped in a sterile fashion.   Local anesthesia: Topical Ethyl chloride.   With sterile technique and under real time ultrasound guidance: 40 mg of Depo-Medrol  and 2 mL of Marcaine injected into knee joint. Fluid seen entering the joint capsule.   Completed without difficulty   Pain immediately resolved suggesting accurate placement of the medication.   Advised to call  if fevers/chills, erythema, induration, drainage, or persistent bleeding.   Images permanently stored and available for review in the ultrasound unit.  Impression: Technically successful ultrasound guided injection.   Procedure: Real-time Ultrasound Guided Injection of left knee joint superior lateral patella space Device: Philips Affiniti 50G/GE Logiq Images permanently stored and available for review in PACS Verbal informed consent obtained.  Discussed risks and benefits of procedure. Warned about infection, bleeding, hyperglycemia damage to structures among others. Patient expresses understanding and agreement Time-out conducted.   Noted no overlying erythema, induration, or other signs of local infection.   Skin prepped in a sterile fashion.   Local anesthesia: Topical Ethyl chloride.   With sterile technique and under real time ultrasound guidance: 40 mg of Depo-Medrol  and 2 mL of Marcaine injected into knee joint. Fluid seen entering the joint capsule.   Completed without difficulty   Pain immediately resolved suggesting accurate placement of the medication.   Advised to call if fevers/chills, erythema, induration, drainage, or persistent bleeding.   Images permanently stored and available for review in the ultrasound unit.  Impression: Technically successful ultrasound guided injection.        Assessment and Plan: 63 y.o. female with bilateral knee pain due to DJD.  Plan for steroid injection today.  Recheck as needed.  She is about 2 years away from retirement.  Will see if we can buy her some time with these injections.  Continue weight loss and quad strengthening check back as needed.   PDMP not reviewed this  encounter. Orders Placed This Encounter  Procedures   US  LIMITED JOINT SPACE STRUCTURES LOW BILAT(NO LINKED CHARGES)    Reason for Exam (SYMPTOM  OR DIAGNOSIS REQUIRED):   bilat knee pain    Preferred imaging location?:   [facility]   No  orders of the defined types were placed in this encounter.    Discussed warning signs or symptoms. Please see discharge instructions. Patient expresses understanding.   The above documentation has been reviewed and is accurate and complete Artist Lloyd, M.D.   "

## 2024-09-08 NOTE — Telephone Encounter (Signed)
 Please re-run pt for single dose gel shots, BILAT knees

## 2024-09-09 ENCOUNTER — Ambulatory Visit: Admitting: Family Medicine

## 2024-09-09 ENCOUNTER — Other Ambulatory Visit: Payer: Self-pay

## 2024-09-09 ENCOUNTER — Encounter: Payer: Self-pay | Admitting: Family Medicine

## 2024-09-09 VITALS — BP 136/80 | HR 73 | Ht 65.0 in | Wt 178.0 lb

## 2024-09-09 DIAGNOSIS — M25562 Pain in left knee: Secondary | ICD-10-CM

## 2024-09-09 DIAGNOSIS — G8929 Other chronic pain: Secondary | ICD-10-CM | POA: Diagnosis not present

## 2024-09-09 DIAGNOSIS — M170 Bilateral primary osteoarthritis of knee: Secondary | ICD-10-CM

## 2024-09-09 DIAGNOSIS — M25561 Pain in right knee: Secondary | ICD-10-CM

## 2024-09-09 MED ORDER — METHYLPREDNISOLONE ACETATE 40 MG/ML IJ SUSP
40.0000 mg | Freq: Once | INTRAMUSCULAR | Status: AC
  Administered 2024-09-09: 40 mg via INTRA_ARTICULAR

## 2024-09-09 NOTE — Patient Instructions (Addendum)
 Thank you for coming in today.   You received an injection today. Seek immediate medical attention if the joint becomes red, extremely painful, or is oozing fluid.   See you back as needed.

## 2024-09-18 ENCOUNTER — Telehealth: Payer: Self-pay

## 2024-09-18 NOTE — Telephone Encounter (Signed)
 [employer] benefits ran for bilateral knee case ID [id number]

## 2024-09-25 NOTE — Telephone Encounter (Signed)
 Scheduled 12/08/24  [employer] authorized bilateral knee PRE CERT REQUIRED Copay $50 Deductible does not apply OOP MAX $2500 has met $0 Once OOP has been met coverage goes to 100%  AUTHORIZATION # [id number] 09/21/24-09/20/25

## 2024-09-28 NOTE — Telephone Encounter (Signed)
 noted

## 2024-10-14 ENCOUNTER — Other Ambulatory Visit: Payer: Self-pay | Admitting: Internal Medicine

## 2024-10-14 DIAGNOSIS — E785 Hyperlipidemia, unspecified: Secondary | ICD-10-CM

## 2024-10-14 DIAGNOSIS — E1159 Type 2 diabetes mellitus with other circulatory complications: Secondary | ICD-10-CM

## 2024-12-08 ENCOUNTER — Ambulatory Visit: Admitting: Family Medicine
